# Patient Record
Sex: Male | Born: 1940 | Race: Black or African American | Hispanic: No | Marital: Married | Smoking: Never smoker
Health system: Southern US, Community
[De-identification: ages and names within clinical notes are randomized; demographics above are authoritative.]

## PROBLEM LIST (undated history)

## (undated) DIAGNOSIS — I472 Ventricular tachycardia, unspecified: Secondary | ICD-10-CM

## (undated) DIAGNOSIS — I2510 Atherosclerotic heart disease of native coronary artery without angina pectoris: Secondary | ICD-10-CM

## (undated) DIAGNOSIS — D649 Anemia, unspecified: Secondary | ICD-10-CM

## (undated) DIAGNOSIS — J9610 Chronic respiratory failure, unspecified whether with hypoxia or hypercapnia: Secondary | ICD-10-CM

## (undated) DIAGNOSIS — I639 Cerebral infarction, unspecified: Secondary | ICD-10-CM

## (undated) DIAGNOSIS — I480 Paroxysmal atrial fibrillation: Secondary | ICD-10-CM

## (undated) DIAGNOSIS — I4729 Other ventricular tachycardia: Secondary | ICD-10-CM

## (undated) DIAGNOSIS — I5032 Chronic diastolic (congestive) heart failure: Secondary | ICD-10-CM

## (undated) DIAGNOSIS — D491 Neoplasm of unspecified behavior of respiratory system: Secondary | ICD-10-CM

## (undated) DIAGNOSIS — Q549 Hypospadias, unspecified: Secondary | ICD-10-CM

## (undated) DIAGNOSIS — IMO0002 Reserved for concepts with insufficient information to code with codable children: Secondary | ICD-10-CM

## (undated) DIAGNOSIS — G459 Transient cerebral ischemic attack, unspecified: Secondary | ICD-10-CM

## (undated) DIAGNOSIS — I10 Essential (primary) hypertension: Secondary | ICD-10-CM

## (undated) DIAGNOSIS — J189 Pneumonia, unspecified organism: Secondary | ICD-10-CM

## (undated) DIAGNOSIS — I351 Nonrheumatic aortic (valve) insufficiency: Secondary | ICD-10-CM

## (undated) DIAGNOSIS — N179 Acute kidney failure, unspecified: Secondary | ICD-10-CM

---

## 2004-04-22 ENCOUNTER — Emergency Department: Payer: Self-pay | Admitting: Unknown Physician Specialty

## 2004-11-23 ENCOUNTER — Emergency Department: Payer: Self-pay | Admitting: Unknown Physician Specialty

## 2005-12-29 ENCOUNTER — Emergency Department: Payer: Self-pay | Admitting: Emergency Medicine

## 2006-02-18 ENCOUNTER — Other Ambulatory Visit: Payer: Self-pay

## 2006-02-18 ENCOUNTER — Emergency Department: Payer: Self-pay | Admitting: Unknown Physician Specialty

## 2006-02-20 ENCOUNTER — Emergency Department: Payer: Self-pay | Admitting: General Practice

## 2006-06-07 ENCOUNTER — Ambulatory Visit: Payer: Self-pay | Admitting: Urology

## 2006-07-04 ENCOUNTER — Ambulatory Visit: Payer: Self-pay | Admitting: Urology

## 2007-01-01 ENCOUNTER — Ambulatory Visit: Payer: Self-pay | Admitting: Family Medicine

## 2009-05-17 ENCOUNTER — Emergency Department: Payer: Self-pay | Admitting: Unknown Physician Specialty

## 2011-10-29 LAB — CBC WITH DIFFERENTIAL/PLATELET
Basophil #: 0 10*3/uL (ref 0.0–0.1)
Lymphocyte %: 7.8 %
MCH: 31.3 pg (ref 26.0–34.0)
MCHC: 32.1 g/dL (ref 32.0–36.0)
MCV: 98 fL (ref 80–100)
Monocyte %: 5.5 %
Neutrophil #: 13 10*3/uL — ABNORMAL HIGH (ref 1.4–6.5)
Platelet: 102 10*3/uL — ABNORMAL LOW (ref 150–440)
RDW: 14.7 % — ABNORMAL HIGH (ref 11.5–14.5)
WBC: 15.1 10*3/uL — ABNORMAL HIGH (ref 3.8–10.6)

## 2011-10-29 LAB — COMPREHENSIVE METABOLIC PANEL
Albumin: 3.2 g/dL — ABNORMAL LOW (ref 3.4–5.0)
Alkaline Phosphatase: 83 U/L (ref 50–136)
Anion Gap: 8 (ref 7–16)
BUN: 15 mg/dL (ref 7–18)
Bilirubin,Total: 0.4 mg/dL (ref 0.2–1.0)
Calcium, Total: 8.7 mg/dL (ref 8.5–10.1)
Co2: 26 mmol/L (ref 21–32)
EGFR (African American): >60
Glucose: 130 mg/dL — ABNORMAL HIGH (ref 65–99)
Osmolality: 278 (ref 275–301)
SGPT (ALT): 22 U/L
Sodium: 138 mmol/L (ref 136–145)
Total Protein: 7.5 g/dL (ref 6.4–8.2)

## 2011-10-29 LAB — URINALYSIS, COMPLETE
Bilirubin,UR: NEGATIVE
Glucose,UR: NEGATIVE mg/dL (ref 0–75)
Ketone: NEGATIVE
Ph: 5 (ref 4.5–8.0)
Protein: NEGATIVE
Squamous Epithelial: 2
WBC UR: 107 /HPF (ref 0–5)

## 2011-10-30 ENCOUNTER — Inpatient Hospital Stay: Payer: Self-pay | Admitting: Internal Medicine

## 2011-10-30 LAB — TROPONIN I
Troponin-I: 0.19 ng/mL — ABNORMAL HIGH
Troponin-I: 0.19 ng/mL — ABNORMAL HIGH

## 2011-10-30 LAB — CK TOTAL AND CKMB (NOT AT ARMC)
CK, Total: 41 U/L (ref 35–232)
CK, Total: 32 U/L — ABNORMAL LOW (ref 35–232)
CK-MB: <0.5 ng/mL — ABNORMAL LOW (ref 0.5–3.6)

## 2011-10-31 LAB — CBC WITH DIFFERENTIAL/PLATELET
Basophil #: 0 10*3/uL (ref 0.0–0.1)
Basophil %: 0.3 %
Eosinophil #: 0.1 10*3/uL (ref 0.0–0.7)
Eosinophil %: 0.9 %
HGB: 12.7 g/dL — ABNORMAL LOW (ref 13.0–18.0)
Lymphocyte #: 1.3 10*3/uL (ref 1.0–3.6)
MCH: 31.4 pg (ref 26.0–34.0)
Monocyte #: 0.8 x10 3/mm (ref 0.2–1.0)
Monocyte %: 6.3 %
Neutrophil #: 11 10*3/uL — ABNORMAL HIGH (ref 1.4–6.5)
Neutrophil %: 83 %
Platelet: 87 10*3/uL — ABNORMAL LOW (ref 150–440)
RBC: 4.03 10*6/uL — ABNORMAL LOW (ref 4.40–5.90)
RDW: 14.6 % — ABNORMAL HIGH (ref 11.5–14.5)

## 2011-10-31 LAB — LIPID PANEL
Cholesterol: 85 mg/dL (ref 0–200)
Ldl Cholesterol, Calc: 31 mg/dL (ref 0–100)
Triglycerides: 59 mg/dL (ref 0–200)
VLDL Cholesterol, Calc: 12 mg/dL (ref 5–40)

## 2011-10-31 LAB — MAGNESIUM: Magnesium: 1.7 mg/dL — ABNORMAL LOW

## 2011-10-31 LAB — BASIC METABOLIC PANEL
Anion Gap: 11 (ref 7–16)
BUN: 13 mg/dL (ref 7–18)
Chloride: 106 mmol/L (ref 98–107)
Co2: 26 mmol/L (ref 21–32)
Creatinine: 0.88 mg/dL (ref 0.60–1.30)
EGFR (African American): >60
EGFR (Non-African Amer.): >60
Glucose: 80 mg/dL (ref 65–99)
Potassium: 3.3 mmol/L — ABNORMAL LOW (ref 3.5–5.1)

## 2011-10-31 LAB — PROTIME-INR
INR: 1.1
Prothrombin Time: 14.1 secs (ref 11.5–14.7)

## 2011-10-31 LAB — PHOSPHORUS: Phosphorus: 2.8 mg/dL (ref 2.5–4.9)

## 2011-10-31 LAB — URINE CULTURE

## 2011-11-01 LAB — CBC WITH DIFFERENTIAL/PLATELET
Basophil #: 0 10*3/uL (ref 0.0–0.1)
Eosinophil #: 0.4 10*3/uL (ref 0.0–0.7)
HCT: 38.1 % — ABNORMAL LOW (ref 40.0–52.0)
HGB: 12.1 g/dL — ABNORMAL LOW (ref 13.0–18.0)
Lymphocyte #: 1.5 10*3/uL (ref 1.0–3.6)
MCH: 31.1 pg (ref 26.0–34.0)
MCV: 98 fL (ref 80–100)
Monocyte %: 7 %
Neutrophil %: 69.3 %
Platelet: 88 10*3/uL — ABNORMAL LOW (ref 150–440)
RBC: 3.87 10*6/uL — ABNORMAL LOW (ref 4.40–5.90)
RDW: 14.6 % — ABNORMAL HIGH (ref 11.5–14.5)

## 2011-11-01 LAB — URINE CULTURE

## 2011-11-04 LAB — CULTURE, BLOOD (SINGLE)

## 2013-04-09 ENCOUNTER — Ambulatory Visit (HOSPITAL_COMMUNITY): Admit: 2013-04-09 | Payer: Self-pay | Admitting: Cardiology

## 2013-04-09 ENCOUNTER — Inpatient Hospital Stay (HOSPITAL_COMMUNITY)
Admission: EM | Admit: 2013-04-09 | Discharge: 2013-04-30 | DRG: 004 | Disposition: A | Discharge status: Home Health | Payer: Medicare Other | Attending: Cardiology | Admitting: Cardiology

## 2013-04-09 ENCOUNTER — Encounter (HOSPITAL_COMMUNITY): Payer: Self-pay | Admitting: Physician Assistant

## 2013-04-09 ENCOUNTER — Encounter (HOSPITAL_COMMUNITY)
Admission: EM | Disposition: A | Discharge status: Home Health | Payer: Medicare Other | Source: Home / Self Care | Attending: Cardiology

## 2013-04-09 ENCOUNTER — Emergency Department (HOSPITAL_COMMUNITY): Payer: Medicare Other

## 2013-04-09 DIAGNOSIS — I10 Essential (primary) hypertension: Secondary | ICD-10-CM | POA: Diagnosis present

## 2013-04-09 DIAGNOSIS — Z930 Tracheostomy status: Secondary | ICD-10-CM

## 2013-04-09 DIAGNOSIS — I69959 Hemiplegia and hemiparesis following unspecified cerebrovascular disease affecting unspecified side: Secondary | ICD-10-CM

## 2013-04-09 DIAGNOSIS — I959 Hypotension, unspecified: Secondary | ICD-10-CM | POA: Diagnosis present

## 2013-04-09 DIAGNOSIS — I4891 Unspecified atrial fibrillation: Secondary | ICD-10-CM

## 2013-04-09 DIAGNOSIS — J9620 Acute and chronic respiratory failure, unspecified whether with hypoxia or hypercapnia: Secondary | ICD-10-CM | POA: Diagnosis present

## 2013-04-09 DIAGNOSIS — J9600 Acute respiratory failure, unspecified whether with hypoxia or hypercapnia: Secondary | ICD-10-CM | POA: Diagnosis present

## 2013-04-09 DIAGNOSIS — I5043 Acute on chronic combined systolic (congestive) and diastolic (congestive) heart failure: Secondary | ICD-10-CM | POA: Diagnosis present

## 2013-04-09 DIAGNOSIS — I351 Nonrheumatic aortic (valve) insufficiency: Secondary | ICD-10-CM | POA: Diagnosis present

## 2013-04-09 DIAGNOSIS — T50995A Adverse effect of other drugs, medicaments and biological substances, initial encounter: Secondary | ICD-10-CM | POA: Diagnosis not present

## 2013-04-09 DIAGNOSIS — G92 Toxic encephalopathy: Secondary | ICD-10-CM | POA: Diagnosis not present

## 2013-04-09 DIAGNOSIS — C3410 Malignant neoplasm of upper lobe, unspecified bronchus or lung: Secondary | ICD-10-CM | POA: Diagnosis present

## 2013-04-09 DIAGNOSIS — I5033 Acute on chronic diastolic (congestive) heart failure: Secondary | ICD-10-CM | POA: Diagnosis present

## 2013-04-09 DIAGNOSIS — T17308A Unspecified foreign body in larynx causing other injury, initial encounter: Secondary | ICD-10-CM | POA: Diagnosis not present

## 2013-04-09 DIAGNOSIS — R918 Other nonspecific abnormal finding of lung field: Secondary | ICD-10-CM

## 2013-04-09 DIAGNOSIS — E875 Hyperkalemia: Secondary | ICD-10-CM | POA: Diagnosis not present

## 2013-04-09 DIAGNOSIS — I472 Ventricular tachycardia, unspecified: Secondary | ICD-10-CM | POA: Diagnosis present

## 2013-04-09 DIAGNOSIS — I4729 Other ventricular tachycardia: Secondary | ICD-10-CM | POA: Diagnosis not present

## 2013-04-09 DIAGNOSIS — R1319 Other dysphagia: Secondary | ICD-10-CM | POA: Diagnosis present

## 2013-04-09 DIAGNOSIS — G929 Unspecified toxic encephalopathy: Secondary | ICD-10-CM | POA: Diagnosis not present

## 2013-04-09 DIAGNOSIS — Z823 Family history of stroke: Secondary | ICD-10-CM

## 2013-04-09 DIAGNOSIS — I2510 Atherosclerotic heart disease of native coronary artery without angina pectoris: Secondary | ICD-10-CM | POA: Diagnosis present

## 2013-04-09 DIAGNOSIS — J9690 Respiratory failure, unspecified, unspecified whether with hypoxia or hypercapnia: Secondary | ICD-10-CM

## 2013-04-09 DIAGNOSIS — Y9210 Unspecified residential institution as the place of occurrence of the external cause: Secondary | ICD-10-CM | POA: Diagnosis not present

## 2013-04-09 DIAGNOSIS — R7309 Other abnormal glucose: Secondary | ICD-10-CM | POA: Diagnosis not present

## 2013-04-09 DIAGNOSIS — I2119 ST elevation (STEMI) myocardial infarction involving other coronary artery of inferior wall: Secondary | ICD-10-CM | POA: Diagnosis present

## 2013-04-09 DIAGNOSIS — Z931 Gastrostomy status: Secondary | ICD-10-CM

## 2013-04-09 DIAGNOSIS — M436 Torticollis: Secondary | ICD-10-CM | POA: Diagnosis present

## 2013-04-09 DIAGNOSIS — I319 Disease of pericardium, unspecified: Secondary | ICD-10-CM | POA: Diagnosis present

## 2013-04-09 DIAGNOSIS — N179 Acute kidney failure, unspecified: Secondary | ICD-10-CM | POA: Diagnosis present

## 2013-04-09 DIAGNOSIS — I69991 Dysphagia following unspecified cerebrovascular disease: Secondary | ICD-10-CM

## 2013-04-09 DIAGNOSIS — I428 Other cardiomyopathies: Secondary | ICD-10-CM | POA: Diagnosis present

## 2013-04-09 DIAGNOSIS — Z993 Dependence on wheelchair: Secondary | ICD-10-CM

## 2013-04-09 DIAGNOSIS — D649 Anemia, unspecified: Secondary | ICD-10-CM | POA: Diagnosis present

## 2013-04-09 DIAGNOSIS — J9501 Hemorrhage from tracheostomy stoma: Secondary | ICD-10-CM

## 2013-04-09 DIAGNOSIS — Z8673 Personal history of transient ischemic attack (TIA), and cerebral infarction without residual deficits: Secondary | ICD-10-CM

## 2013-04-09 DIAGNOSIS — R5381 Other malaise: Secondary | ICD-10-CM | POA: Diagnosis present

## 2013-04-09 DIAGNOSIS — E870 Hyperosmolality and hypernatremia: Secondary | ICD-10-CM | POA: Diagnosis present

## 2013-04-09 DIAGNOSIS — Q549 Hypospadias, unspecified: Secondary | ICD-10-CM

## 2013-04-09 DIAGNOSIS — Z7982 Long term (current) use of aspirin: Secondary | ICD-10-CM

## 2013-04-09 DIAGNOSIS — J690 Pneumonitis due to inhalation of food and vomit: Secondary | ICD-10-CM | POA: Diagnosis present

## 2013-04-09 DIAGNOSIS — Z79899 Other long term (current) drug therapy: Secondary | ICD-10-CM

## 2013-04-09 DIAGNOSIS — IMO0002 Reserved for concepts with insufficient information to code with codable children: Secondary | ICD-10-CM

## 2013-04-09 DIAGNOSIS — I69920 Aphasia following unspecified cerebrovascular disease: Secondary | ICD-10-CM

## 2013-04-09 DIAGNOSIS — I509 Heart failure, unspecified: Secondary | ICD-10-CM | POA: Diagnosis present

## 2013-04-09 DIAGNOSIS — E876 Hypokalemia: Secondary | ICD-10-CM | POA: Diagnosis present

## 2013-04-09 DIAGNOSIS — I213 ST elevation (STEMI) myocardial infarction of unspecified site: Secondary | ICD-10-CM

## 2013-04-09 DIAGNOSIS — I480 Paroxysmal atrial fibrillation: Secondary | ICD-10-CM | POA: Diagnosis present

## 2013-04-09 HISTORY — DX: Reserved for concepts with insufficient information to code with codable children: IMO0002

## 2013-04-09 HISTORY — DX: Neoplasm of unspecified behavior of respiratory system: D49.1

## 2013-04-09 HISTORY — DX: Pneumonia, unspecified organism: J18.9

## 2013-04-09 HISTORY — PX: LEFT HEART CATHETERIZATION WITH CORONARY ANGIOGRAM: SHX5451

## 2013-04-09 HISTORY — DX: Acute kidney failure, unspecified: N17.9

## 2013-04-09 HISTORY — DX: Atherosclerotic heart disease of native coronary artery without angina pectoris: I25.10

## 2013-04-09 HISTORY — DX: Hypospadias, unspecified: Q54.9

## 2013-04-09 HISTORY — DX: Transient cerebral ischemic attack, unspecified: G45.9

## 2013-04-09 HISTORY — DX: Ventricular tachycardia, unspecified: I47.20

## 2013-04-09 HISTORY — DX: Paroxysmal atrial fibrillation: I48.0

## 2013-04-09 HISTORY — DX: Other ventricular tachycardia: I47.29

## 2013-04-09 HISTORY — DX: Nonrheumatic aortic (valve) insufficiency: I35.1

## 2013-04-09 HISTORY — DX: Essential (primary) hypertension: I10

## 2013-04-09 HISTORY — DX: Anemia, unspecified: D64.9

## 2013-04-09 HISTORY — DX: Cerebral infarction, unspecified: I63.9

## 2013-04-09 HISTORY — DX: Chronic respiratory failure, unspecified whether with hypoxia or hypercapnia: J96.10

## 2013-04-09 HISTORY — DX: Ventricular tachycardia: I47.2

## 2013-04-09 HISTORY — DX: Chronic diastolic (congestive) heart failure: I50.32

## 2013-04-09 HISTORY — PX: JEJUNOSTOMY FEEDING TUBE: SUR737

## 2013-04-09 LAB — APTT: aPTT: 24 seconds (ref 24–37)

## 2013-04-09 LAB — POCT I-STAT, CHEM 8
BUN: 21 mg/dL (ref 6–23)
Creatinine, Ser: 1.4 mg/dL — ABNORMAL HIGH (ref 0.50–1.35)
Glucose, Bld: 203 mg/dL — ABNORMAL HIGH (ref 70–99)
Hemoglobin: 16.7 g/dL (ref 13.0–17.0)
Potassium: 4 mEq/L (ref 3.5–5.1)
Sodium: 144 mEq/L (ref 135–145)
TCO2: 25 mmol/L (ref 0–100)

## 2013-04-09 LAB — HEMOGLOBIN A1C
Hgb A1c MFr Bld: 5.5 % (ref <5.7)
Mean Plasma Glucose: 111 mg/dL (ref <117)

## 2013-04-09 LAB — URINE MICROSCOPIC-ADD ON

## 2013-04-09 LAB — CBC
HCT: 46.2 % (ref 39.0–52.0)
MCHC: 32.5 g/dL (ref 30.0–36.0)
Platelets: 173 10*3/uL (ref 150–400)
RDW: 15 % (ref 11.5–15.5)
WBC: 10.6 10*3/uL — ABNORMAL HIGH (ref 4.0–10.5)

## 2013-04-09 LAB — URINALYSIS, ROUTINE W REFLEX MICROSCOPIC
Glucose, UA: NEGATIVE mg/dL
Leukocytes, UA: NEGATIVE
pH: 5 (ref 5.0–8.0)

## 2013-04-09 LAB — TROPONIN I
Troponin I: <0.3 ng/mL (ref <0.30)
Troponin I: 0.63 ng/mL (ref <0.30)

## 2013-04-09 LAB — COMPREHENSIVE METABOLIC PANEL
AST: 23 U/L (ref 0–37)
Albumin: 3.1 g/dL — ABNORMAL LOW (ref 3.5–5.2)
CO2: 23 mEq/L (ref 19–32)
Calcium: 8.8 mg/dL (ref 8.4–10.5)
Creatinine, Ser: 1.17 mg/dL (ref 0.50–1.35)
GFR calc non Af Amer: 61 mL/min — ABNORMAL LOW (ref >90)
Glucose, Bld: 207 mg/dL — ABNORMAL HIGH (ref 70–99)

## 2013-04-09 LAB — PROTIME-INR
INR: 1.27 (ref 0.00–1.49)
Prothrombin Time: 15.6 seconds — ABNORMAL HIGH (ref 11.6–15.2)

## 2013-04-09 LAB — POCT I-STAT 3, ART BLOOD GAS (G3+)
Acid-base deficit: 4 mmol/L — ABNORMAL HIGH (ref 0.0–2.0)
Acid-base deficit: 5 mmol/L — ABNORMAL HIGH (ref 0.0–2.0)
Bicarbonate: 22.5 mEq/L (ref 20.0–24.0)
O2 Saturation: 97 %
O2 Saturation: 92 %
Patient temperature: 98.9
Patient temperature: 98.7
TCO2: 24 mmol/L (ref 0–100)
pH, Arterial: 7.325 — ABNORMAL LOW (ref 7.350–7.450)
pO2, Arterial: 101 mmHg — ABNORMAL HIGH (ref 80.0–100.0)

## 2013-04-09 LAB — LIPID PANEL
Cholesterol: 131 mg/dL (ref 0–200)
HDL: 46 mg/dL (ref >39)
Total CHOL/HDL Ratio: 2.8 RATIO

## 2013-04-09 LAB — HEPARIN LEVEL (UNFRACTIONATED): Heparin Unfractionated: 0.52 IU/mL (ref 0.30–0.70)

## 2013-04-09 LAB — CK TOTAL AND CKMB (NOT AT ARMC)
CK, MB: 2.4 ng/mL (ref 0.3–4.0)
Relative Index: INVALID (ref 0.0–2.5)

## 2013-04-09 LAB — INFLUENZA PANEL BY PCR (TYPE A & B): Influenza A By PCR: NEGATIVE

## 2013-04-09 LAB — MAGNESIUM: Magnesium: 1.6 mg/dL (ref 1.5–2.5)

## 2013-04-09 SURGERY — LEFT HEART CATHETERIZATION WITH CORONARY ANGIOGRAM
Anesthesia: LOCAL

## 2013-04-09 MED ORDER — AMIODARONE HCL IN DEXTROSE 360-4.14 MG/200ML-% IV SOLN
60.0000 mg/h | INTRAVENOUS | Status: AC
  Administered 2013-04-09 (×2): 60 mg/h via INTRAVENOUS
  Filled 2013-04-09: qty 200

## 2013-04-09 MED ORDER — ASPIRIN 81 MG PO CHEW
81.0000 mg | CHEWABLE_TABLET | Freq: Every day | ORAL | Status: DC
  Administered 2013-04-10 – 2013-04-30 (×21): 81 mg via JEJUNOSTOMY
  Filled 2013-04-09 (×21): qty 1

## 2013-04-09 MED ORDER — METOPROLOL TARTRATE 25 MG/10 ML ORAL SUSPENSION
25.0000 mg | Freq: Four times a day (QID) | ORAL | Status: DC
  Administered 2013-04-09 – 2013-04-16 (×24): 25 mg
  Filled 2013-04-09 (×33): qty 10

## 2013-04-09 MED ORDER — HEPARIN SODIUM (PORCINE) 5000 UNIT/ML IJ SOLN: 4000.0000 [IU] | INTRAMUSCULAR | Status: AC

## 2013-04-09 MED ORDER — ETOMIDATE 2 MG/ML IV SOLN
INTRAVENOUS | Status: AC | PRN
  Administered 2013-04-09: 30 mg via INTRAVENOUS

## 2013-04-09 MED ORDER — PIPERACILLIN-TAZOBACTAM 3.375 G IVPB 30 MIN: 3.3750 g | Freq: Once | INTRAVENOUS | Status: DC

## 2013-04-09 MED ORDER — BIOTENE DRY MOUTH MT LIQD
15.0000 mL | Freq: Four times a day (QID) | OROMUCOSAL | Status: DC
  Administered 2013-04-09 – 2013-04-30 (×80): 15 mL via OROMUCOSAL

## 2013-04-09 MED ORDER — ATORVASTATIN CALCIUM 40 MG PO TABS
40.0000 mg | ORAL_TABLET | Freq: Every day | ORAL | Status: DC
  Administered 2013-04-09 – 2013-04-14 (×6): 40 mg
  Filled 2013-04-09 (×7): qty 1

## 2013-04-09 MED ORDER — NITROGLYCERIN IN D5W 200-5 MCG/ML-% IV SOLN
3.0000 ug/min | INTRAVENOUS | Status: DC
  Administered 2013-04-09: 25 ug/min via INTRAVENOUS

## 2013-04-09 MED ORDER — DEXTROSE 5 % IV SOLN
500.0000 mg | INTRAVENOUS | Status: DC
  Administered 2013-04-09 – 2013-04-15 (×7): 500 mg via INTRAVENOUS
  Filled 2013-04-09 (×8): qty 500

## 2013-04-09 MED ORDER — ONDANSETRON HCL 4 MG/2ML IJ SOLN: 4.0000 mg | Freq: Four times a day (QID) | INTRAMUSCULAR | Status: DC | PRN

## 2013-04-09 MED ORDER — DEXTROSE 5 % IV SOLN
2.0000 g | INTRAVENOUS | Status: DC
  Administered 2013-04-09 – 2013-04-18 (×10): 2 g via INTRAVENOUS
  Filled 2013-04-09 (×10): qty 2

## 2013-04-09 MED ORDER — DEXTROSE 5 % IV SOLN: 1.0000 g | Freq: Two times a day (BID) | INTRAVENOUS | Status: DC

## 2013-04-09 MED ORDER — MUPIROCIN 2 % EX OINT
1.0000 | TOPICAL_OINTMENT | Freq: Two times a day (BID) | CUTANEOUS | Status: AC
Start: 2013-04-09 — End: 2013-04-13
  Administered 2013-04-09 – 2013-04-13 (×10): 1 via NASAL
  Filled 2013-04-09 (×2): qty 22

## 2013-04-09 MED ORDER — CHLORHEXIDINE GLUCONATE 0.12 % MT SOLN
15.0000 mL | Freq: Two times a day (BID) | OROMUCOSAL | Status: DC
  Administered 2013-04-09 – 2013-04-30 (×42): 15 mL via OROMUCOSAL
  Filled 2013-04-09 (×44): qty 15

## 2013-04-09 MED ORDER — CLOPIDOGREL BISULFATE 300 MG PO TABS
300.0000 mg | ORAL_TABLET | Freq: Once | ORAL | Status: AC
  Administered 2013-04-09: 300 mg
  Filled 2013-04-09: qty 1

## 2013-04-09 MED ORDER — NITROGLYCERIN 0.4 MG SL SUBL: 0.4000 mg | SUBLINGUAL_TABLET | SUBLINGUAL | Status: DC | PRN

## 2013-04-09 MED ORDER — CLOPIDOGREL BISULFATE 75 MG PO TABS
75.0000 mg | ORAL_TABLET | Freq: Every day | ORAL | Status: DC
  Administered 2013-04-10 – 2013-04-14 (×5): 75 mg
  Filled 2013-04-09 (×8): qty 1

## 2013-04-09 MED ORDER — METOPROLOL TARTRATE 25 MG PO TABS: 25.0000 mg | ORAL_TABLET | Freq: Four times a day (QID) | ORAL | Status: DC

## 2013-04-09 MED ORDER — CHLORHEXIDINE GLUCONATE CLOTH 2 % EX PADS
6.0000 | MEDICATED_PAD | Freq: Every day | CUTANEOUS | Status: AC
  Administered 2013-04-10 – 2013-04-14 (×5): 6 via TOPICAL

## 2013-04-09 MED ORDER — ASPIRIN 300 MG RE SUPP: 300.0000 mg | Freq: Once | RECTAL | Status: DC

## 2013-04-09 MED ORDER — FENTANYL CITRATE 0.05 MG/ML IJ SOLN
100.0000 ug | INTRAMUSCULAR | Status: DC | PRN
  Administered 2013-04-09: 100 ug via INTRAVENOUS
  Filled 2013-04-09: qty 2

## 2013-04-09 MED ORDER — ASPIRIN 81 MG PO CHEW
324.0000 mg | CHEWABLE_TABLET | Freq: Once | ORAL | Status: AC
  Administered 2013-04-09: 324 mg
  Filled 2013-04-09: qty 4

## 2013-04-09 MED ORDER — ROCURONIUM BROMIDE 50 MG/5ML IV SOLN
INTRAVENOUS | Status: AC | PRN
  Administered 2013-04-09: 80 mg via INTRAVENOUS

## 2013-04-09 MED ORDER — SODIUM CHLORIDE 0.9 % IV SOLN
10.0000 ug/h | INTRAVENOUS | Status: DC
  Administered 2013-04-09: 10 ug/h via INTRAVENOUS
  Filled 2013-04-09: qty 50

## 2013-04-09 MED ORDER — SODIUM CHLORIDE 0.9 % IV SOLN
1.0000 mL/kg/h | INTRAVENOUS | Status: AC
  Administered 2013-04-09: 1 mL/kg/h via INTRAVENOUS

## 2013-04-09 MED ORDER — HEPARIN (PORCINE) IN NACL 100-0.45 UNIT/ML-% IJ SOLN
1050.0000 [IU]/h | INTRAMUSCULAR | Status: DC
  Administered 2013-04-09: 950 [IU]/h via INTRAVENOUS
  Administered 2013-04-10: 1050 [IU]/h via INTRAVENOUS
  Filled 2013-04-09 (×3): qty 250

## 2013-04-09 MED ORDER — OXYBUTYNIN CHLORIDE 5 MG PO TABS
5.0000 mg | ORAL_TABLET | Freq: Every day | ORAL | Status: DC
  Administered 2013-04-10 – 2013-04-30 (×21): 5 mg
  Filled 2013-04-09 (×22): qty 1

## 2013-04-09 MED ORDER — SODIUM CHLORIDE 0.9 % IV BOLUS (SEPSIS)
500.0000 mL | Freq: Once | INTRAVENOUS | Status: AC
  Administered 2013-04-09: 500 mL via INTRAVENOUS

## 2013-04-09 MED ORDER — FENTANYL CITRATE 0.05 MG/ML IJ SOLN: 100.0000 ug | INTRAMUSCULAR | Status: DC | PRN

## 2013-04-09 MED ORDER — AMIODARONE HCL IN DEXTROSE 360-4.14 MG/200ML-% IV SOLN
30.0000 mg/h | INTRAVENOUS | Status: DC
  Administered 2013-04-09 – 2013-04-12 (×6): 30 mg/h via INTRAVENOUS
  Filled 2013-04-09 (×12): qty 200

## 2013-04-09 MED ORDER — HEPARIN BOLUS VIA INFUSION: 4000.0000 [IU] | Freq: Once | INTRAVENOUS | Status: DC

## 2013-04-09 MED ORDER — PANTOPRAZOLE SODIUM 40 MG IV SOLR
40.0000 mg | Freq: Every day | INTRAVENOUS | Status: DC
  Administered 2013-04-09 – 2013-04-12 (×4): 40 mg via INTRAVENOUS
  Filled 2013-04-09 (×5): qty 40

## 2013-04-09 NOTE — Progress Notes (Signed)
INITIAL NUTRITION ASSESSMENT  DOCUMENTATION CODES Per approved criteria  -Not Applicable   INTERVENTION: 1.  Enteral nutrition; initiate Jevity 1.2 @ 20 mL/hr continuous.  Advance by 10 mL q 4 hrs to 55 mL/hr continuous with Prostat once daily to provide 1684 kcal, 88g protein, and 1082 mL free water.   NUTRITION DIAGNOSIS: Inadequate oral intake related to inability to eat as evidenced by vent, NPO.   Monitor:  1.  Enteral nutrition; initiation with tolerance.  Pt to meet >/=90% estimated needs with nutrition support.  2.  Wt/wt change; monitor trends  Reason for Assessment: vent, home TF  72 y.o. male  Admitting Dx: NSTEMI  ASSESSMENT: Pt admitted with NSTEMI. Patient is currently intubated on ventilator support.  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 He has some ability to communication per wife, however is not verbal.  Home regimen is: 1 can Osmolite 1.2 + 1 can Jevity 1.2 in the mornings for a total of 2 cans daily.  In addition, pt consumes meals and snacks throughout the day.  Wife prepares foods soft and bland, and mashes them into a soft bites.  Pt can chew and swallow well at baseline.   Height: Ht Readings from Last 1 Encounters:  04/09/13 5\' 6"  (1.676 m)    Weight: Wt Readings from Last 1 Encounters:  04/09/13 158 lb 1.1 oz (71.7 kg)    Ideal Body Weight: 70 kg  % Ideal Body Weight: 102%  Wt Readings from Last 10 Encounters:  04/09/13 158 lb 1.1 oz (71.7 kg)  04/09/13 158 lb 1.1 oz (71.7 kg)    Usual Body Weight: unknown per family they deny wt loss  BMI:  Body mass index is 25.53 kg/(m^2).  Estimated Nutritional Needs: Kcal: 1786 Protein: 84-98g Fluid: >1.8 L/day  Skin: intact  Diet Order: NPO  EDUCATION NEEDS: -No education needs identified at this time  No intake or output data in the 24  hours ending 04/09/13 1015  Last BM: PTA  Labs:   Recent Labs Lab 04/09/13 0755  NA 142  K 4.0  CL 103  CO2 23  BUN 19  CREATININE 1.17  CALCIUM 8.8  GLUCOSE 207*    CBG (last 3)  No results found for this basename: GLUCAP,  in the last 72 hours  Scheduled Meds: . aspirin  324 mg Per Tube Once  . [START ON 04/10/2013] aspirin  81 mg Per J Tube Daily  . atorvastatin  40 mg Per J Tube q1800  . azithromycin  500 mg Intravenous Q24H  . cefTRIAXone (ROCEPHIN)  IV  1 g Intravenous Q12H  . clopidogrel  300 mg Per J Tube Once  . [START ON 04/10/2013] clopidogrel  75 mg Per J Tube Daily  . heparin  4,000 Units Intravenous STAT  . metoprolol tartrate  25 mg Per J Tube Q6H  . oxybutynin  5 mg Per J Tube Daily  . pantoprazole (PROTONIX) IV  40 mg Intravenous Daily    Continuous Infusions: . sodium chloride 1 mL/kg/hr (04/09/13 0900)  . amiodarone (NEXTERONE PREMIX) 360 mg/200 mL dextrose 60 mg/hr (04/09/13 0900)  . amiodarone (NEXTERONE PREMIX) 360 mg/200 mL dextrose    . heparin 950 Units/hr (04/09/13 0900)  . nitroGLYCERIN 25 mcg/min (04/09/13 0900)    Past Medical History  Diagnosis Date  . CVA (cerebral  infarction)     a. 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 b. Residual aphasia and torticollis. Does not ambulate - uses a motorized chair.  Marland Kitchen TIA (transient ischemic attack)     a. 2013 around time of pneumonia - x3  . Pneumonia   . History of jejunostomy tube placement     a. Per wife due to strokes.  . Irregular heartbeat     a. Per wife. No hx of blood thinners or complications from blood thinners.  . Enlarged heart     a. Per wife.  Marland Kitchen HTN (hypertension)   . Pulmonary mass     a. Wife states has been followed with serial cxrs.    No past surgical history on file.  Loyce Dys, MS RD LDN Clinical Inpatient Dietitian Pager: [phone] Weekend/After hours pager: [phone]

## 2013-04-09 NOTE — Progress Notes (Signed)
Chaplain responded to code stemi.  Pt was taken to trauma C.  Just outside of trauma C I met pt's wife.  She was noticeably distressed.  I escorted  pt's wife to cath lab waiting area and offered emotional support and prayer.  I was present as Dr. Swaz[place] explained the results of the cath.  I then accompanied the pt's wife to [facility] waiting area and continued to offer support.   04/09/13 0800  Clinical Encounter Type  Visited With Family;Patient not available  Visit Type Spiritual support;Critical Care  Spiritual Encounters  Spiritual Needs Emotional;Prayer    Rulon Abide

## 2013-04-09 NOTE — CV Procedure (Signed)
   Cardiac Catheterization Procedure Note  Name: [name] [name] MRN: [id number] DOB: 1941/02/20  Procedure: Left Heart Cath, Selective Coronary Angiography, LV angiography  Indication: 72 year old black male with history of multiple CVAs and chronic aphasia. Patient presents with acute respiratory failure associated with atrial fibrillation a rapid ventricular response. ECG showed diffuse ST elevation more pronounced in the inferior lateral leads. In the emergency department the patient decompensated and required intubation. He developed sustained ventricular tachycardia requiring cardioversion. ECG showed persistent ST elevation as noted. Family wanted aggressive management. There was delay in bringing the patient to the cath lab because of his hemodynamic instability and needs for management of his respiratory failure in the emergency department.   Procedural details: The right groin was prepped, draped, and anesthetized with 1% lidocaine. Using modified Seldinger technique, a  6 [place] sheath was introduced into the right femoral artery and a separate 6 French sheath was placed in the right femoral vein for IV access. Standard Judkins catheters were used for coronary angiography. Catheter exchanges were performed over a guidewire. There were no immediate procedural complications. The patient was transferred to the post catheterization recovery area for further monitoring.  Procedural Findings: Hemodynamics:  AO 190/104 with a mean of 133 mmHg LV 189/27 mmHg   Coronary angiography: Coronary dominance: Left  Left mainstem: Normal.  Left anterior descending (LAD): Minor irregularities less than 10%.  There is a large ramus intermediate branch which is normal.  Left circumflex (LCx): This is a large dominant vessel. There is diffuse 20% narrowing in the proximal vessel. The second marginal branch has a 40% stenosis proximally.  Right coronary artery (RCA): The right coronary is a  nondominant vessel. It gives rise to a single right ventricular marginal branch. In the mid vessel there is a 95% stenosis that is focal. There is TIMI grade 3 flow.  Left ventriculography: Not performed  Final Conclusions:   1. Single vessel obstructive coronary disease. Patient does have a critical stenosis in a nondominant right coronary. The large dominant left coronary system is without significant disease.  Recommendations: I recommend medical management of his coronary disease. I do not think that the right coronary lesion is the cause of his acute decompensation. Chest x-ray indicates a significant right lung pneumonia. It is possible that his ST changes may be related to pericarditis in association with his pneumonia. We will manage him with IV heparin and nitroglycerin. We'll continue aspirin and beta blocker therapy. We will add Plavix. Critical care medicine was consult for management of his pneumonia and respiratory failure. Patient is critically ill and prognosis is poor. We will obtain an echocardiogram to assess his LV function.  Theron Arista [facility] 04/09/2013, 8:47 AM

## 2013-04-09 NOTE — H&P (Signed)
History and Physical  Patient ID: [name] [name] MRN: [id number], DOB: 1941/02/12 Date of Encounter: 04/09/2013, 8:27 AM Primary Physician: No primary provider on file. Primary Cardiologist: New, being seen by Dr. Swaziland.  Chief Complaint: SOB Reason for Admission: STEMI, AF-RVR, VT, acute resp failure.  HPI: [name] [name] is a 72 y/o M with history of multiple CVA/TIAs (residual aphasia, chronic torticollis-like neck alignment and J-tube placement), irregular heartbeat, enlarged heart, "small pulmonary mass" and HTN who presented to [facility] today with STEMI, rapid AF, acute respiratory failure, and VT. His wife provides all of his history. She denies prior h/o CAD but endorses history of irregular heartbeat and enlarged heart "due to his strokes." She states [name] [name] has never been on blood thinners besides ASA and has not had any bleeding issues. [name] [name] is confined to a motorized wheelchair at home. She says [name] [name] has baseline aphasia at home but is still able to communicate. She reports that last night [name] [name] developed worsening SOB. [name] [name] has dyspnea at times but this was worse. [name] [name] denied any CP or other acute symptoms. She gave him his aspirin and atenolol and [name] [name] was able to rest. This morning however [name] [name] complained of increased SOB. She gave him juice and [name] [name] began coughing. She says [name] [name] takes Jevity/meds through his g-tube but states that [name] [name] also does eat at home. She called EMS who found the patient to be in respiratory distress and hypotensive at 71/54. [name] [name] was in rapid atrial fibrillation with ST elevation in II, avF, V2-V6. Code STEMI was called. [name] [name] also began to have NSVT thus was placed on amio gtt without bolus by EMS. In the ED, breathing became agonal so [name] [name] was intubated. [name] [name] developed a run of sustained VT and received shock x 1. [name] [name] remains on amiodarone gtt. [name] [name] converted to NSR in the ED with PACs/PVCs. Now that [name] [name] is back in NSR, BP is markedly elevated. CXR shows multilobar R lung airspace disease  with involvement also suspected at left lung base, consider large volume aspiration vs multilobar PNA. Rectal ASA not given in ER thus was ordered ASA 324mg  x 1 in cath lab via J-tube.  His wife denies any recent CP, fever, chills, nausea, vomiting, bleeding issues. She was alarmed to hear [name] [name] was having a heart attack since [name] [name]'s never had CAD before.  Past Medical History  Diagnosis Date  . CVA (cerebral infarction)     a. 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 b. Residual aphasia and torticollis. Does not ambulate - uses a motorized chair.  Marland Kitchen TIA (transient ischemic attack)     a. 2013 around time of pneumonia - x3  . Pneumonia   . History of jejunostomy tube placement     a. Per wife due to strokes.  . Irregular heartbeat     a. Per wife. No hx of blood thinners or complications from blood thinners.  . Enlarged heart     a. Per wife.  Marland Kitchen HTN (hypertension)   . Pulmonary mass     a. Wife states has been followed with serial cxrs.     Most Recent Cardiac Studies: None   Surgical History: No past surgical history on file.   Home Meds: Prior to Admission medications   Medication Sig Start Date End Date Taking? Authorizing Provider  aspirin 325 MG tablet Take 325 mg by mouth daily.   Yes Historical Provider, MD  atenolol (TENORMIN) 50 MG tablet Take 75 mg by mouth 2 (two) times daily.  Yes Historical Provider, MD  diazepam (VALIUM) 5 MG tablet Take 5 mg by mouth daily.   Yes Historical Provider, MD  hydrochlorothiazide (HYDRODIURIL) 25 MG tablet Take 25 mg by mouth daily.   Yes Historical Provider, MD  oxybutynin (DITROPAN) 5 MG tablet Take 5 mg by mouth daily.   Yes Historical Provider, MD  potassium chloride (K-DUR) 10 MEQ tablet Take 20 mEq by mouth daily.   Yes Historical Provider, MD  ramipril (ALTACE) 10 MG capsule Take 10 mg by mouth 2 (two) times daily.   Yes Historical Provider, MD  simvastatin (ZOCOR) 40 MG tablet Take 40 mg by mouth at bedtime.   Yes Historical Provider, MD       Allergies: No Known Allergies  History   Social History  . Marital Status: N/A    Spouse Name: N/A    Number of Children: N/A  . Years of Education: N/A   Occupational History  . Not on file.   Social History Main Topics  . Smoking status: Never Smoker   . Smokeless tobacco: Not on file  . Alcohol Use: No  . Drug Use: No  . Sexual Activity: Not on file   Other Topics Concern  . Not on file   Social History Narrative   Lives with wife who has been primary care giver. Married 50 yrs as of 08/2012. They have a son but do not see him regularly.     Family History  Problem Relation Age of Onset  . Stroke Father     Review of Systems: unable to obtain due to intubation/sedation  Labs: Istat Hgb 16.7, Hc 49, Na 144, K 4.0, Glu 203, BUN 21, Cr 1.4  Radiology/Studies:  Dg Chest Portable 1 View 04/09/2013   CLINICAL DATA:  72 year old male with shortness of Breath status post intubation. Initial encounter.  EXAM: PORTABLE CHEST - 1 VIEW  COMPARISON:  None.  FINDINGS: Portable AP supine view at 0727 hrs. Intubated. Endotracheal tube tip in good position just below the clavicles. Enteric tube courses to the abdomen, tip not included.  Scoliosis. There is cardiomegaly. Other mediastinal contours are within normal limits.  Nodular and confluent opacity throughout the right lung, and also suspected at the left lung base. Left lung base partially obscured by pacer/resuscitation pads. No pneumothorax or definite effusion.  IMPRESSION: 1. Endotracheal tube in good position. Enteric tube courses to the abdomen, tip not included.  2. Multilobar right lung airspace disease. Involvement also suspected at the left lung base. Top considerations are large volume aspiration versus multilobar pneumonia.   Electronically Signed   By: Augusto Gamble M.D.   On: 04/09/2013 07:40    EKG:  6:26 - atrial fib RVR 155bpm ST elevation II, avF, V2-V6 (up to 1mm V4-V5), TWI I, avL, V5-V6 7:32 - NSR with PVCs,  PACs residual ST elevation II, III, avF, V3-V6 with TWI I, avL, V5-V6  Physical Exam: Blood pressure 146/99, pulse 78, temperature 98.8 F (37.1 C), resp. rate 26, height 5\' 5"  (1.651 m), weight 180 lb (81.647 kg), SpO2 96.00%. General: Chronically ill AAM intubated, sedated Head: Atraumatic, sclera non-icteric, no xanthomas. Torticollis present.  Neck: JVD not elevated. Lungs: Intubated. Diffuse rhonchi throughout. Heart: Difficult to hear over lung sounds but RRR. No murmurs, rubs, or gallops appreciated. Abdomen: Soft, rounded with normoactive bowel sounds. No rebound/guarding.  Msk:  Strength and tone appear normal for age. Extremities: No clubbing or cyanosis. No edema.  Distal pedal pulses are 2+ and equal bilaterally.  Neuro: Sedated. Nonresponsive.     ASSESSMENT AND PLAN:  1. Acute respiratory failure with probable aspiration pneumonia 2. Hypotension then hypertension 3. Acute inferolateral STEMI/CAD 4. Ventricular tachycardia s/p defibrillation x 1 5. Atrial fibrillation with RVR  6. Multiple CVA/TIAs with chronic J-tube, scooter-bound, aphasia, torticollis-like neck alignment 7. Pulmonary mass per wife followed by serial CXRs recently  Per discussion with Dr. [place], continue IV NTG, heparin, aspirin. Load with 300mg  Plavix then 75mg  daily. Cycle troponins. Check blood cx prior to antibiotic initiation. Change atenolol to metoprolol 25mg  q6hr. Hold HCTZ, KCl, ACEI. Will continue amiodarone drip for now given VT. Change statin to lipitor. PCCM has been consulted for pulmonary issues which may have driven his overall presentation.  Code status: The wife states the patient desired to be full-code. She is his POA.  Signed, Ronie Spies PA-C 04/09/2013, 8:27 AM  Patient seen and examined and history reviewed. Agree with above findings and plan. Patient is a 72 year old black male who is chronically debilitated from multiple prior CVAs. [name] [name] is aphasic and has a feeding jejunostomy  tube in place. [name] [name] presents with acute respiratory failure. In the emergency department [name] [name] developed agonal breathing and required intubation. ECG initially showed atrial fibrillation with a rapid ventricular response. [name] [name] had diffuse ST elevation more pronounced in the inferior lateral leads. In the emergency department [name] [name] developed sustained ventricular tachycardia requiring defibrillation. [name] [name] was started on IV amiodarone. ECG shows persistent ST elevation. In discussion with the wife the family would like aggressive treatment and the patient is a full CODE BLUE. We have recommended emergent cardiac catheterization to define his coronary anatomy and treatment options. Chest x-ray shows dense right lung pneumonia. Patient will be started on IV antibiotics. We'll consult critical care management.  Theron Arista [facility]  04/09/2013 8:55 AM

## 2013-04-09 NOTE — Consult Note (Signed)
PULMONARY  / CRITICAL CARE MEDICINE  Name: [name] [name] MRN: [id number] DOB: [birth year]    ADMISSION DATE:  04/09/2013 CONSULTATION DATE:  04/09/13 at [facility]  REFERRING MD :  Dr. Swaziland PRIMARY SERVICE: PCCM  CHIEF COMPLAINT:  Shortness of Breath, STEMI, Respiratory Failure, Multifocal Pneumonia  BRIEF PATIENT DESCRIPTION: [name] [name] is a 72 year old male with multiple CVA/TIAs with aphasia/torticollis/PEG, Chronic Atrial Fibrillation, Enlarge Heart, Pulmonary Mass, HTN came to the ED for shortness of breath.  He was found to have STEMI, Atrial Fibrillation, Respiratory Failure, VT, Hypotention then Hypertension, and CAP/PNA in the ED.  He was shock was once in the ED for sustained VT, given Amiodarone, Intubated, and taken to the Cath Lab by cardiology.  Catheterization reveal 95% occluded RCA but no intervention was done due non dominant nature.  He is now back in the cardiac ICU for further treatment post cardiac cath.  SIGNIFICANT EVENTS / STUDIES:  11/21 >>> CXR with proper ET tube placement and multilobar PNA 11/21 >>> Cardiac Cath, 95% occluded RCA, no intervention due to non dominant nature 11/21 >>> Respiratory failure required intubation  LINES / TUBES: Peripheral IVs 11/21 >>> Foley Catheter 11/21 >>> PEG Tube ?Date? >>> Right Femoral Venous Line 11/21 >>> Right Femoral A-Line 11/21 >>>  CULTURES: 11/21 Sputum culture >>> 11/21 Blood culture >>> 11/21 Urine culture >>> 11/21 Influenza swab>>  ANTIBIOTICS: None  HISTORY OF PRESENT ILLNESS:  [name] [name] is a 72 year old male with multiple CVA/TIAs with aphasia/torticollis/PEG, Chronic Atrial Fibrillation/Irregular heart beat, Enlarge Heart, Pulmonary Mass, HTN came to the ED for shortness of breath.  At the time of evaluation, he was intubated and sedated so did no provide history.  According to EMR, he was found to have respiratory failure, VT, and STEMI on presentation.  He was intubated in the ED for respiratory  support.  He was shock once for VT and covert back to NSR.  He was given Zosyn for possible multilobar PNA on chest X-Ray.  He was then taken to the Cath Lab for intervention.  He was found to have 95% RCA occlusion but no intervention was done due to nondominant and possibly good collateral.  She is now in the ICU for further management.  He is currently vent and sedated in no acute respiratory distress.  PAST MEDICAL HISTORY :  Past Medical History  Diagnosis Date  . CVA (cerebral infarction)     a. 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 b. Residual aphasia and torticollis. Does not ambulate - uses a motorized chair.  Marland Kitchen TIA (transient ischemic attack)     a. 2013 around time of pneumonia - x3  . Pneumonia   . History of jejunostomy tube placement     a. Per wife due to strokes.  . Irregular heartbeat     a. Per wife. No hx of blood thinners or complications from blood thinners.  . Enlarged heart     a. Per wife.  Marland Kitchen HTN (hypertension)   . Pulmonary mass     a. Wife states has been followed with serial cxrs.   No past surgical history on file. Prior to Admission medications   Medication Sig Start Date End Date Taking? Authorizing Provider  aspirin 325 MG tablet 325 mg by Per J Tube route daily.    Yes Historical Provider, MD  atenolol (TENORMIN) 50 MG tablet 75 mg by Per J Tube route 2 (two) times daily.    Yes Historical Provider, MD  diazepam (VALIUM) 5 MG tablet 5 mg by Per J Tube route daily.    Yes Historical Provider, MD  hydrochlorothiazide (HYDRODIURIL) 25 MG tablet 25 mg by Per J Tube route daily.    Yes Historical Provider, MD  oxybutynin (DITROPAN) 5 MG tablet 5 mg by Per J Tube route daily.    Yes Historical Provider, MD  potassium chloride (K-DUR) 10 MEQ tablet 20 mEq by Per J Tube route daily.    Yes Historical Provider, MD  ramipril (ALTACE) 10 MG capsule 10 mg by Per J Tube route 2 (two) times daily.    Yes Historical Provider, MD  simvastatin (ZOCOR) 40 MG tablet 40 mg by  Per J Tube route at bedtime.    Yes Historical Provider, MD   No Known Allergies  FAMILY HISTORY:  Family History  Problem Relation Age of Onset  . Stroke Father    SOCIAL HISTORY:  reports that he has never smoked. He does not have any smokeless tobacco history on file. He reports that he does not drink alcohol or use illicit drugs.  REVIEW OF SYSTEMS:  Not possible due to vent and sedation  SUBJECTIVE:  See HPI  VITAL SIGNS: Temp:  [98.6 F (37 C)-98.8 F (37.1 C)] 98.8 F (37.1 C) (11/21 0731) Pulse Rate:  [38-151] 71 (11/21 0845) Resp:  [19-30] 20 (11/21 0845) BP: (146-160)/(99-119) 159/113 mmHg (11/21 0845) SpO2:  [72 %-100 %] 93 % (11/21 0845) FiO2 (%):  [100 %] 100 % (11/21 0845) Weight:  [81.647 kg (180 lb)] 81.647 kg (180 lb) (11/21 0710) HEMODYNAMICS:   VENTILATOR SETTINGS: Vent Mode:  [-] PRVC FiO2 (%):  [100 %] 100 % Set Rate:  [20 bmp] 20 bmp Vt Set:  [500 mL] 500 mL PEEP:  [8 cmH20] 8 cmH20 Plateau Pressure:  [23 cmH20] 23 cmH20 INTAKE / OUTPUT: Intake/Output   None     PHYSICAL EXAMINATION: General:  Ill appearing male lying in bed in no apparent acute distress with ET and sedation Neuro:  Vent and Sedated HEENT:  NCAT, coarse dry hair, no lesion,     EOMI, no discharge, no pus, no crust, no sign of infection     Intact, no discharge, no abnormality noted     Intact, no discharge, no congestion     Dry mucosal, ET tube in place Cardiovascular:  NSR with PVC on tracing, s1 and s2 heard, no murmur/rub/gallop Lungs:  Coarse bilaterally due to vent, no wheezing Abdomen:  PEG in epigastric, no sign of drainage or discharge         Soft, non tender, non distended, no guarding, (+)BS, no ascites Musculoskeletal:  Intact, atrophic LE, no edema, positive and equal pulses Skin:  Dry and scaly in LE, warm to touch, no lesion  LABS:  CBC  Recent Labs Lab 04/09/13 0755  WBC 10.6*  HGB 15.0  HCT 46.2  PLT 173   Coag's  Recent Labs Lab  04/09/13 0755  APTT 24  INR 1.27   BMET No results found for this basename: NA, K, CL, CO2, BUN, CREATININE, GLUCOSE,  in the last 168 hours Electrolytes No results found for this basename: CALCIUM, MG, PHOS,  in the last 168 hours Sepsis Markers No results found for this basename: LATICACIDVEN, PROCALCITON, O2SATVEN,  in the last 168 hours ABG No results found for this basename: PHART, PCO2ART, PO2ART,  in the last 168 hours Liver Enzymes No results found for this basename: AST, ALT, ALKPHOS, BILITOT, ALBUMIN,  in the  last 168 hours Cardiac Enzymes No results found for this basename: TROPONINI, PROBNP,  in the last 168 hours Glucose No results found for this basename: GLUCAP,  in the last 168 hours  Imaging Dg Chest Portable 1 View  04/09/2013   CLINICAL DATA:  72 year old male with shortness of Breath status post intubation. Initial encounter.  EXAM: PORTABLE CHEST - 1 VIEW  COMPARISON:  None.  FINDINGS: Portable AP supine view at 0727 hrs. Intubated. Endotracheal tube tip in good position just below the clavicles. Enteric tube courses to the abdomen, tip not included.  Scoliosis. There is cardiomegaly. Other mediastinal contours are within normal limits.  Nodular and confluent opacity throughout the right lung, and also suspected at the left lung base. Left lung base partially obscured by pacer/resuscitation pads. No pneumothorax or definite effusion.  IMPRESSION: 1. Endotracheal tube in good position. Enteric tube courses to the abdomen, tip not included.  2. Multilobar right lung airspace disease. Involvement also suspected at the left lung base. Top considerations are large volume aspiration versus multilobar pneumonia.   Electronically Signed   By: Augusto Gamble M.D.   On: 04/09/2013 07:40     CXR:   11/21 >>> Possible large volume aspirate in right lung, possible multilobar PNA  ASSESSMENT / PLAN:  PULMONARY A: Respiratory Failure likely secondary to PNA and VT on  admission  Hypoxemia from aspiration     Large volume aspirated versus multilobar PNA/CAP  No elevated WBC, no typical PNA symptoms per wife P:   - Adequate oxygenation and is synchronous breathing on vent - Increase RR, wean FiO2 - Fentanyl drip for sedation - Rocephin and Azithromycin for CAP - Sputum/Blood/Urine culture - Trending lactic acid  CARDIOVASCULAR A: VT s/p Shock x1 now in SR with intermittent PVC on Amiodarone drip     CAD 95% Occluded RCA     STEMI s/p Cardiac Cath without intervention     Hypertension on History     Possible Chronic Atrial Fibrillation, Rate Control without anticoagulation per wife P:  - Continue heparin drip per cardiology until sheet come out - ASA, BB, NGT, Plavix, Statins - Check lipid  - Continue Amiodarone about 24 hours - Treat blood pressure as needed - Trend troponin  RENAL A:  No Documented Problem Currently      History of urethral tear from Foley per wife P:   - Check CMP - Urinalysis and urine culture  GASTROINTESTINAL A:  S/p PEG for diet supplement due to CVA       Patient can still performed oral intake P:   - Resume tube feed ASAP  HEMATOLOGIC A:  No Current Problem P:  - Monitor CBC  INFECTIOUS A:  Multilobar PNA on Chest X-ray P:   - Empiric coverage for CAP - Sputum/blood culture  ENDOCRINE A:  No Documented Problem   P:   - ISS for BG control  NEUROLOGIC A:  Encephalopathy due to sedative medication on vent P:   - AAOx3 at baseline with aphasia and wheelchair dependent - Will d/c vent and sedation as soon as possible  TODAY'S SUMMARY:  VT, PNA/CAP, STEMI, Respiratory Failure on Vent, and s/p Cardiac Cath without intervention.  Keep current vent setting until ABG, Sedate with Fentanyl, empiric CAP Coverage.  Continue Heparin/Amiodarone drip.  CAD/STEMI core measures.  I have seen and examined [name] [name] with Dr. Laney Potash.  Will wean FiO2 and increase RR now, cover for CAP, will need aspiration  evaluation post extubation.  STEMI  coverage per cardiology  I have personally obtained a history, examined the patient, evaluated laboratory and imaging results, formulated the assessment and plan and placed orders. CRITICAL CARE: The patient is critically ill with multiple organ systems failure and requires high complexity decision making for assessment and support, frequent evaluation and titration of therapies, application of advanced monitoring technologies and extensive interpretation of multiple databases. Critical Care Time devoted to patient care services described in this note is 45 minutes.   Yolonda Kida PCCM Pager: [phone] Cell: [phone] If no response, call [phone]  04/09/2013, 9:07 AM

## 2013-04-09 NOTE — ED Provider Notes (Signed)
CSN: [id number]     Arrival date & time 04/09/13  6440 History   None    Chief Complaint  Patient presents with  . Code STEMI   (Consider location/radiation/quality/duration/timing/severity/associated sxs/prior Treatment) HPI Comments: 72 year old male brought in by EMS for shortness of breath. The history is unobtainable from the patient due to altered mental status. Patient lives at home has a G-tube. He does not know his normal mental status. Cough or shortness but that started yesterday and found him in atrial fibrillation with RVR and shortness of breath with hypoxia. Initially he did have a low blood pressure in the 70s. He went into ventricular tachycardia with EMS and was shocked and given amiodarone. He did seem to be responding to them initially in the patellar short of breath, however in the ambulance he became more distressed and eventually agonal. He did talk to family and noted that he was a full code.   No past medical history on file. No past surgical history on file. No family history on file. History  Substance Use Topics  . Smoking status: Not on file  . Smokeless tobacco: Not on file  . Alcohol Use: Not on file    Review of Systems  Unable to perform ROS: Mental status change    Allergies  Review of patient's allergies indicates not on file.  Home Medications  No current outpatient prescriptions on file. BP 146/99  Pulse 78  Temp(Src) 98.8 F (37.1 C)  Resp 26  Ht 5\' 5"  (1.651 m)  Wt 180 lb (81.647 kg)  BMI 29.95 kg/m2  SpO2 96% Physical Exam  Nursing note and vitals reviewed. Constitutional: He appears cachectic.  HENT:  Head: Normocephalic and atraumatic.  Right Ear: External ear normal.  Left Ear: External ear normal.  Nose: Nose normal.  Eyes: Right eye exhibits no discharge. Left eye exhibits no discharge.  Neck: Neck supple.  Head and neck turned to the left  Cardiovascular: Normal heart sounds and intact distal pulses.  An irregular rhythm  present. Tachycardia present.   Pulmonary/Chest: He has rhonchi. He has rales.  Abdominal: Soft. He exhibits no distension.  Gtube in place  Musculoskeletal: He exhibits no edema.  Neurological: He is unresponsive. GCS eye subscore is 4. GCS verbal subscore is 1. GCS motor subscore is 1.  Skin: Skin is warm and dry.    ED Course  INTUBATION Date/Time: 04/09/2013 7:35 AM Performed by: Pricilla Loveless T Authorized by: Pricilla Loveless T Consent: The procedure was performed in an emergent situation. Patient identity confirmed: anonymous protocol, patient vented/unresponsive Indications: respiratory failure, airway protection and hypoxemia Intubation method: video-assisted Patient status: paralyzed (RSI) Preoxygenation: nonrebreather mask Sedatives: etomidate Paralytic: rocuronium Tube size: 7.5 mm Tube type: cuffed Number of attempts: 1 Cords visualized: yes Post-procedure assessment: chest rise and CO2 detector Breath sounds: equal Cuff inflated: yes ETT to lip: 23 cm Tube secured with: ETT holder Chest x-ray interpreted by me. Chest x-ray findings: endotracheal tube in appropriate position Patient tolerance: Patient tolerated the procedure well with no immediate complications.  IO LINE INSERTION Date/Time: 04/09/2013 7:36 AM Performed by: Pricilla Loveless T Authorized by: Pricilla Loveless T Consent: The procedure was performed in an emergent situation. Time out: Immediately prior to procedure a "time out" was called to verify the correct patient, procedure, equipment, support staff and site/side marked as required. Indications: fluid administration, medication administration and rapid vascular access Local anesthesia used: no Insertion site: left proximal tibia Site preparation: alcohol Preparation: Patient was prepped and  draped in the usual sterile fashion. Insertion device: drill device Insertion: needle was inserted through the bony cortex Number of attempts:  1 Confirmation method: stability of the needle, easy infusion of fluids and aspiration of blood/marrow Patient tolerance: Patient tolerated the procedure well with no immediate complications. CARDIOVERSION Date/Time: 04/09/2013 7:37 AM Performed by: Pricilla Loveless T Authorized by: Pricilla Loveless T Consent: The procedure was performed in an emergent situation. Patient identity confirmed: anonymous protocol, patient vented/unresponsive Cardioversion basis: emergent Pre-procedure rhythm: ventricular tachycardia Patient position: patient was placed in a supine position Chest area: chest area exposed Electrodes: pads Electrodes placed: anterior-posterior Number of attempts: 1 Attempt 1 mode: asynchronous Attempt 1 shock (in Joules): 200 Attempt 1 outcome: conversion to normal sinus rhythm Post-procedure rhythm: normal sinus rhythm with frequent PVCs. Complications: no complications Patient tolerance: Patient tolerated the procedure well with no immediate complications.   (including critical care time) Labs Review Labs Reviewed  CBC WITH DIFFERENTIAL  PROTIME-INR  URINALYSIS, ROUTINE W REFLEX MICROSCOPIC   Imaging Review Dg Chest Portable 1 View  04/09/2013   CLINICAL DATA:  72 year old male with shortness of Breath status post intubation. Initial encounter.  EXAM: PORTABLE CHEST - 1 VIEW  COMPARISON:  None.  FINDINGS: Portable AP supine view at 0727 hrs. Intubated. Endotracheal tube tip in good position just below the clavicles. Enteric tube courses to the abdomen, tip not included.  Scoliosis. There is cardiomegaly. Other mediastinal contours are within normal limits.  Nodular and confluent opacity throughout the right lung, and also suspected at the left lung base. Left lung base partially obscured by pacer/resuscitation pads. No pneumothorax or definite effusion.  IMPRESSION: 1. Endotracheal tube in good position. Enteric tube courses to the abdomen, tip not included.  2. Multilobar  right lung airspace disease. Involvement also suspected at the left lung base. Top considerations are large volume aspiration versus multilobar pneumonia.   Electronically Signed   By: Augusto Gamble M.D.   On: 04/09/2013 07:40    EKG Interpretation    Date/Time:  Friday April 09 2013 07:32:41 EST Ventricular Rate:  87 PR Interval:    QRS Duration: 108 QT Interval:  406 QTC Calculation: 488 R Axis:   67 Text Interpretation:  Normal Sinus rhythm Multiple premature complexes, vent  Second deg AVB, Mobitz I (Wenckebach) Inferior infarct, acute (LCx) Anterolateral infarct ** ** ACUTE MI / STEMI ** ** No old tracing to compare Confirmed by Annabel Gibeau  MD, Daralyn Bert ([id number]) on 04/09/2013 7:43:28 AM            MDM   1. Respiratory failure   2. Ventricular tachycardia   3. STEMI (ST elevation myocardial infarction)    Patient unresponsive with poor respiratory effort on arrival. Confirmed with EMS he was a full code, and was RSI'd for airway protection. Cardioverted ventricular tachycardia as he was about to be intubated, with return of NSR. Intubated with one attempt, multiple secretions noted. Hard to oxygenate initially, had to go to 100% FIO2 and increase PEEP. Xray concerning for aspiration vs PNA, will cover with zosyn initially. Dr. Swaziland present as patient was called a Code STEMI in the field, and his team has talked to family who has given a better history and agrees to cath. EKG here concerning for STEMI as well. Patient is a very difficult access for IVs, so an IO was placed for access as patient is going to cath lab. Dr. Swaziland states they will get further bloodwork with a central line in the cath lab.  Thus, unable to get blood cultures or other labs prior to abx. Patient was given rectal ASA and heparin bolus prior to leaving.    Audree Camel, MD 04/09/13 [phone]

## 2013-04-09 NOTE — Progress Notes (Signed)
MD notified about hypotension. Assessed pt and no further orders made. Will continue to monitor.

## 2013-04-09 NOTE — Progress Notes (Signed)
ANTICOAGULATION CONSULT NOTE - Follow Up Consult  Pharmacy Consult for Heparin Indication: chest pain/ACS  No Known Allergies  Patient Measurements: Height: 5\' 6"  (167.6 cm) Weight: 158 lb 1.1 oz (71.7 kg) IBW/kg (Calculated) : 63.8   Vital Signs: Temp: 99.8 F (37.7 C) (11/21 2000) Temp src: Oral (11/21 2000) BP: 132/104 mmHg (11/21 1930) Pulse Rate: 59 (11/21 2000)  Labs:  Recent Labs  04/09/13 0755 04/09/13 0800 04/09/13 1115 04/09/13 1517 04/09/13 2012  HGB 15.0 16.7  --   --   --   HCT 46.2 49.0  --   --   --   PLT 173  --   --   --   --   APTT 24  --   --   --   --   LABPROT 15.6*  --   --   --   --   INR 1.27  --   --   --   --   HEPARINUNFRC  --   --  0.51  --  0.52  CREATININE 1.17 1.40*  --   --   --   CKTOTAL 57  --   --   --   --   CKMB 2.4  --   --   --   --   TROPONINI <0.30  --   --  0.63*  --     Estimated Creatinine Clearance: 43.7 ml/min (by C-G formula based on Cr of 1.4).   Assessment: 72yom admitted for ACS > STEMI > cath showed ruptured plaque with no intervention -leave sheath in and restarted heparin after cath.  Heparin drip 850 uts/hr  HL at goal 0.52, no bleeding noted, CBC stable.  Goal of Therapy:  Heparin level 0.3-0.7 units/ml Monitor platelets by anticoagulation protocol: Yes   Plan:   Heparin drip 950 uts/hr  Daily CBC, heparin level  Leota Sauers Pharm.D. CPP, BCPS Clinical Pharmacist [phone] 04/09/2013 10:25 PM

## 2013-04-09 NOTE — Progress Notes (Signed)
[id number]- Rec'd pt in cath lab from ED RT.  No apparent respiratory complications noted during cath procedure.  Pt then transported to ICU bed (w/ no apparent complications during transport) and remains on vent settings pt was placed on in ED.  CCM rounding on pt.  Pt presently on PRVC 500vt, rate 20, peep 8, 100%, waiting on vent orders.  ICU RT given report on this pt.

## 2013-04-09 NOTE — Progress Notes (Signed)
CRITICAL VALUE ALERT  Critical value received: Troponin 0.63  Date of notification:  04/09/13  Time of notification:  2028  Critical value read back:yes  Nurse who received alert:  Jorge Ny RN  Expected value post cath. Will continue to monitor.

## 2013-04-09 NOTE — ED Notes (Signed)
Family at bedside. Verbalize understanding of plan of care.

## 2013-04-09 NOTE — Care Management Note (Addendum)
Page 1 of 2   04/30/2013     1:32:01 PM   CARE MANAGEMENT NOTE 04/30/2013  Patient:  [name] [name],[name] [name]   Account Number:  [id number]  Date Initiated:  04/09/2013  Documentation initiated by:  Junius Creamer  Subjective/Objective Assessment:   adm w mi, vent     Action/Plan:   lives w wife, pt was confined to motorized w/c pta from prev strokes   Anticipated DC Date:     Anticipated DC Plan:  HOME W HOME HEALTH SERVICES      DC Planning Services  CM consult      [facility] Choice  Resumption Of Svcs/PTA Provider   Choice offered to / List presented to:     DME arranged  TRACH SUPPLIES  SUCTION  TUBE FEEDING  TUBE FEEDING PUMP        HH arranged  HH-1 RN  HH-7 RESPIRATORY THERAPY  HH-5 SPEECH THERAPY      HH agency  [facility]   Status of service:  Completed, signed off Medicare Important Message given?   (If response is "NO", the following Medicare IM given date fields will be blank) Date Medicare IM given:   Date Additional Medicare IM given:    Discharge Disposition:  HOME W HOME HEALTH SERVICES  Per UR Regulation:  Reviewed for med. necessity/level of care/duration of stay  If discussed at Long Length of Stay Meetings, dates discussed:   04/20/2013  04/22/2013  04/27/2013  04/29/2013    Comments:   04-30-13 1327 Tomi Bamberger, RN,BSN [phone] CM did speak to Kennon Rounds and she stated to call Nita with Pharmacy for assistance. Pt will get a 7 day supply of protonix and amio filled by main Pharmacy. Wife will get the other Rx's via [facility] $4.00 list. The origianl Rx's were sent to [facility] and should hopefully be delivered to pt by Wed of next week. Wife is aware that if mail order has not arrived to call [facility]. No further needs from CM at this time. Pt will go home via ambulance.   04-30-13 [address] [place] [phone] CM did fax d/c summary to [facility]. Pt's wife  states that  she only has 25.00 in bank account and pt is on 9 new medications. CM did call Manufacturing systems engineer to see if we could use the [facility] Program just to d/c the pt. CM did touch base with Dayna PA to see if some medications can be tweaked.    04/28/13 0935 Henrietta Mayo RN MSN BSN CCM Dr Redmond School with [facility] is PCP - TC to his office to determine documentation needed re hospitalization, time of next scheduled visit.  VM message left with triage nurse, Victorino Dike [phone]) requesting return call. 1000 TC from [place] who states their office will be able to manage pt's trach.  She requests faxed d/c summary to [phone]. 1445 TC from Levora Dredge, NP with [facility] who provides primary care/home visits.  She requests home health nurse contact her @ [phone] for issues M-F, leave message @ [phone] after hours and weekends.  Information relayed to [facility] liaison.  [employer] [facility] will arrange delivery of equipment tomorrow.  04/27/13 0930 Henrietta Mayo RN MSN BSN CCM Spouse has decided that she will learn to do trach care and take pt home, [facility] is already active with nursing.  RT is planning formal teaching tomorrow.  Will request staff nurses provide instruction as well. 1515 Pt's O2  sats 96-100% off O2.  12/2  1106 debbie dowell rn,bsn had spoken w ltac's and no ltac benefit in pt's ins. have req formal eval to be sure his policy has not benefit. have made sw ref for vent snf also. will cont to follow. was trach on 12-1.met w pt and wife to let them know that ck about ltac benefits but it looks as though this is not option and sw will assist w vent snf.  11/24 1300p debbie dowell rn,bsn [facility] aware of pt's hosp.

## 2013-04-09 NOTE — ED Notes (Signed)
Called to home for respiratory distress.  Upon arrival, pt. Having frothy/pink sputum, rales throughout, agonal respirations with BVM assist; HR ? 90 150's - EMs hung 150 mg iv bolus.  POA wants everything done.

## 2013-04-09 NOTE — Progress Notes (Signed)
ANTICOAGULATION CONSULT NOTE - Initial Consult  Pharmacy Consult for Heparin Indication: chest pain/ACS  No Known Allergies  Patient Measurements: Height: 5\' 5"  (165.1 cm) Weight: 180 lb (81.647 kg) IBW/kg (Calculated) : 61.5  Vital Signs: Temp: 98.8 F (37.1 C) (11/21 0731) BP: 146/99 mmHg (11/21 0715) Pulse Rate: 78 (11/21 0736)  Labs:  Recent Labs  04/09/13 0755  HGB 15.0  HCT 46.2  PLT 173    CrCl is unknown because no creatinine reading has been taken.   Medical History: Past Medical History  Diagnosis Date  . CVA (cerebral infarction)     a. 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 b. Residual aphasia and torticollis. Does not ambulate - uses a motorized chair.  Marland Kitchen TIA (transient ischemic attack)     a. 2013 around time of pneumonia - x3  . Pneumonia   . History of jejunostomy tube placement     a. Per wife due to strokes.  . Irregular heartbeat     a. Per wife. No hx of blood thinners or complications from blood thinners.  . Enlarged heart     a. Per wife.  Marland Kitchen HTN (hypertension)   . Pulmonary mass     a. Wife states has been followed with serial cxrs.    Medications:  Prescriptions prior to admission  Medication Sig Dispense Refill  . aspirin 325 MG tablet Take 325 mg by mouth daily.      Marland Kitchen atenolol (TENORMIN) 50 MG tablet Take 75 mg by mouth 2 (two) times daily.      . diazepam (VALIUM) 5 MG tablet Take 5 mg by mouth daily.      . hydrochlorothiazide (HYDRODIURIL) 25 MG tablet Take 25 mg by mouth daily.      Marland Kitchen oxybutynin (DITROPAN) 5 MG tablet Take 5 mg by mouth daily.      . potassium chloride (K-DUR) 10 MEQ tablet Take 20 mEq by mouth daily.      . ramipril (ALTACE) 10 MG capsule Take 10 mg by mouth 2 (two) times daily.      . simvastatin (ZOCOR) 40 MG tablet Take 40 mg by mouth at bedtime.        Admit Complaint: 72 y.o.  male  admitted 04/09/2013 with STEMI.  Pharmacy consulted to dose heparin.  PMH: Multiple strokes, PEG tube,  aphasic  Overnight Events: 04/09/2013  Worsening SOB at home, choked on some juice, wife called 911 found with STE,, EMS started [facility] for runs of  VT in truck, in ED breathing became agonal and was in SVT, was DCCV.    Assessment: Anticoagulation: VTE Prophylaxis; ACS, IV heparin, 4000 units and 950 units/hr started in the cath lab.    Infectious Disease: empiric aspiration Antibiotics: 11/21 >> zosyn  Cardiovascular: STEMI; CAD, > Cath with ruptured plaque > no intervention, leaving sheath in, starting heparin Current Weight: 180 lb (81.647 kg)   SBP in 180s Heparin, Amiodarone, NTG, ASA, plavix, metoprolol  Endocrinology: Follow glucose  GI / Nutrition: PEG tube  Neurology/MSK: Sedated on vent CPOT:  RASS:  CAM ICU:   Nephrology/Urology/Electrolytes/Optho:   Pulmonary:  VDRF, intubated in ED ; they have been following a pulmonary mass  PTA Medication Issues: Home Meds Not Ordered: HCTZ, ramipril Follow up home medications with wife  Best Practices: VTE Prophylaxis:  IV heparin  Goal of Therapy:  Heparin level 0.3-0.7 units/ml Monitor platelets by anticoagulation protocol: Yes   Plan:  1. Heparin 4000 units IV given in lab, to  start heparin at 950 units/hr 2. Check heparin level in 8h after ggt starts 3. Follow daily heparin level and CBC 4.  NOTE:: sheath to remain in at least until 11/22, follow up plan as it relates to adjusting heparin.   Thank you for allowing pharmacy to be a part of this patients care team.  Lovenia Kim Pharm.D., BCPS Clinical Pharmacist 04/09/2013 8:31 AM Pager: [phone] Phone: [phone]

## 2013-04-10 ENCOUNTER — Inpatient Hospital Stay (HOSPITAL_COMMUNITY): Payer: Medicare Other

## 2013-04-10 DIAGNOSIS — I219 Acute myocardial infarction, unspecified: Secondary | ICD-10-CM

## 2013-04-10 DIAGNOSIS — I359 Nonrheumatic aortic valve disorder, unspecified: Secondary | ICD-10-CM

## 2013-04-10 LAB — LIPID PANEL
LDL Cholesterol: 16 mg/dL (ref 0–99)
Triglycerides: 56 mg/dL (ref <150)
VLDL: 11 mg/dL (ref 0–40)

## 2013-04-10 LAB — COMPREHENSIVE METABOLIC PANEL
AST: 17 U/L (ref 0–37)
Albumin: 2.5 g/dL — ABNORMAL LOW (ref 3.5–5.2)
Alkaline Phosphatase: 60 U/L (ref 39–117)
BUN: 30 mg/dL — ABNORMAL HIGH (ref 6–23)
Calcium: 7.9 mg/dL — ABNORMAL LOW (ref 8.4–10.5)
Chloride: 105 mEq/L (ref 96–112)
GFR calc Af Amer: 39 mL/min — ABNORMAL LOW (ref >90)
GFR calc non Af Amer: 34 mL/min — ABNORMAL LOW (ref >90)
Potassium: 3.7 mEq/L (ref 3.5–5.1)
Total Bilirubin: 0.5 mg/dL (ref 0.3–1.2)
Total Protein: 6.3 g/dL (ref 6.0–8.3)

## 2013-04-10 LAB — GLUCOSE, CAPILLARY
Glucose-Capillary: 88 mg/dL (ref 70–99)
Glucose-Capillary: 85 mg/dL (ref 70–99)

## 2013-04-10 LAB — CBC
HCT: 37 % — ABNORMAL LOW (ref 39.0–52.0)
Hemoglobin: 12.9 g/dL — ABNORMAL LOW (ref 13.0–17.0)
MCH: 32.6 pg (ref 26.0–34.0)
MCV: 93.4 fL (ref 78.0–100.0)
Platelets: 128 10*3/uL — ABNORMAL LOW (ref 150–400)
RDW: 15 % (ref 11.5–15.5)

## 2013-04-10 LAB — LEGIONELLA ANTIGEN, URINE: Legionella Antigen, Urine: NEGATIVE

## 2013-04-10 LAB — HEPARIN LEVEL (UNFRACTIONATED)
Heparin Unfractionated: 0.26 IU/mL — ABNORMAL LOW (ref 0.30–0.70)
Heparin Unfractionated: 0.14 IU/mL — ABNORMAL LOW (ref 0.30–0.70)
Heparin Unfractionated: 0.23 IU/mL — ABNORMAL LOW (ref 0.30–0.70)

## 2013-04-10 LAB — PROCALCITONIN: Procalcitonin: 113.1 ng/mL

## 2013-04-10 MED ORDER — JEVITY 1.2 CAL PO LIQD
1000.0000 mL | ORAL | Status: DC
  Administered 2013-04-10 – 2013-04-16 (×8): 1000 mL
  Administered 2013-04-17 – 2013-04-18 (×2): 65 mL/h
  Administered 2013-04-18: 1000 mL
  Administered 2013-04-19: 16:00:00
  Administered 2013-04-20: 65 mL/h
  Administered 2013-04-21 – 2013-04-29 (×10): 1000 mL
  Filled 2013-04-10 (×36): qty 1000

## 2013-04-10 MED ORDER — SODIUM CHLORIDE 0.9 % IV BOLUS (SEPSIS)
500.0000 mL | Freq: Once | INTRAVENOUS | Status: AC
  Administered 2013-04-10: 500 mL via INTRAVENOUS

## 2013-04-10 MED ORDER — SODIUM CHLORIDE 0.9 % IV SOLN
INTRAVENOUS | Status: DC
  Administered 2013-04-10: 10 mL/h via INTRAVENOUS
  Administered 2013-04-15: 01:00:00 via INTRAVENOUS

## 2013-04-10 MED ORDER — HEPARIN (PORCINE) IN NACL 100-0.45 UNIT/ML-% IJ SOLN
1400.0000 [IU]/h | INTRAMUSCULAR | Status: DC
  Filled 2013-04-10: qty 250

## 2013-04-10 MED ORDER — INSULIN ASPART 100 UNIT/ML ~~LOC~~ SOLN
0.0000 [IU] | SUBCUTANEOUS | Status: DC
  Administered 2013-04-11 – 2013-04-17 (×18): 2 [IU] via SUBCUTANEOUS
  Administered 2013-04-17: 3 [IU] via SUBCUTANEOUS
  Administered 2013-04-18 – 2013-04-28 (×29): 2 [IU] via SUBCUTANEOUS
  Administered 2013-04-29: 1 [IU] via SUBCUTANEOUS
  Administered 2013-04-29: 2 [IU] via SUBCUTANEOUS

## 2013-04-10 MED ORDER — FENTANYL CITRATE 0.05 MG/ML IJ SOLN
100.0000 ug | INTRAMUSCULAR | Status: DC | PRN
  Administered 2013-04-11 – 2013-04-12 (×3): 100 ug via INTRAVENOUS
  Administered 2013-04-13: 50 ug via INTRAVENOUS
  Administered 2013-04-14 – 2013-04-19 (×5): 100 ug via INTRAVENOUS
  Filled 2013-04-10 (×9): qty 2

## 2013-04-10 MED ORDER — SODIUM CHLORIDE 0.9 % IV SOLN
INTRAVENOUS | Status: DC
  Administered 2013-04-10 – 2013-04-12 (×2): 10 mL/h via INTRAVENOUS
  Administered 2013-04-23: 1000 mL via INTRAVENOUS
  Administered 2013-04-25: 18:00:00 via INTRAVENOUS
  Administered 2013-04-26: 10 mL/h via INTRAVENOUS

## 2013-04-10 MED ORDER — SODIUM CHLORIDE 0.9 % IV SOLN
INTRAVENOUS | Status: DC
  Administered 2013-04-10: 10 mL/h via INTRAVENOUS

## 2013-04-10 NOTE — Progress Notes (Signed)
Dr Sherrine Maples notified of pt BPs trending down, SBP in 60's at times with MAP low mid 50's-low 60's. NS bolus started as ordered. Requested further orders concerning re-starting heparin gtt post sheath pull, at this time Dr Sherrine Maples orders to leave sheath in place and continue heparin gtt.

## 2013-04-10 NOTE — Progress Notes (Signed)
eLink Physician-Brief Progress Note Patient Name: [name] [name] DOB: [birth year] MRN: [id number]  Date of Service  04/10/2013   HPI/Events of Note  Patient with episodes of hypotension now sustained with current BP of 68/49 (55).  Is over 10 liters net positive.  No CVP.  UOP is poor.  eICU Interventions  Plan: 500 cc NS bolus IV tiems one for hypotension   Intervention Category Intermediate Interventions: Hypotension - evaluation and management  [name] 04/10/2013, 2:53 AM

## 2013-04-10 NOTE — Consult Note (Signed)
PULMONARY  / CRITICAL CARE MEDICINE  Name: [name] [name] MRN: [id number] DOB: [birth year]    ADMISSION DATE:  04/09/2013 CONSULTATION DATE:  04/09/2013  REFERRING MD :  Cardiology PRIMARY SERVICE: PCCM  CHIEF COMPLAINT:  Acute respiratory failure  BRIEF PATIENT DESCRIPTION: 72 yo with history of multiple CVA (aphasia, PEG) admitted with respiratory failure in setting of pneumonia and STEMI.  SIGNIFICANT EVENTS / STUDIES:  11/21  Admitted with respiratory failure in setting of pneumonia and STEMI 11/21  Cardiac cath >>> 95% occluded RCA, no intervention due to non dominant nature  LINES / TUBES: Foley 11/21 >>> PEG ??? >>> R fem CVL 11/21 >>> R fem A-Line 11/21 >>>  CULTURES: 11/21 Sputum >>> 11/21 Blood >>> 11/21 Urine >>>  ANTIBIOTICS: Azithromycin 11/21 >>> Ceftriaxone 11/21 >>>  SUBJECTIVE:  No overnight events.  VITAL SIGNS: Temp:  [98.7 F (37.1 C)-100.2 F (37.9 C)] 99.1 F (37.3 C) (11/22 0000) Pulse Rate:  [58-77] 67 (11/22 0600) Resp:  [0-30] 25 (11/22 0600) BP: (97-234)/(70-141) 97/73 mmHg (11/22 0415) SpO2:  [84 %-100 %] 100 % (11/22 0600) Arterial Line BP: (77-193)/(56-131) 96/76 mmHg (11/22 0600) FiO2 (%):  [40 %-100 %] 40 % (11/22 0415) Weight:  [70.2 kg (154 lb 12.2 oz)-71.7 kg (158 lb 1.1 oz)] 70.2 kg (154 lb 12.2 oz) (11/22 0320)  HEMODYNAMICS:   VENTILATOR SETTINGS: Vent Mode:  [-] PRVC FiO2 (%):  [40 %-100 %] 40 % Set Rate:  [25 bmp] 25 bmp Vt Set:  [500 mL] 500 mL PEEP:  [8 cmH20-10 cmH20] 10 cmH20 Plateau Pressure:  [22 cmH20-26 cmH20] 23 cmH20  INTAKE / OUTPUT: Intake/Output     11/21 0701 - 11/22 0700 11/22 0701 - 11/23 0700   I.V. (mL/kg) 10644.1 (151.6)    IV Piggyback 300    Total Intake(mL/kg) 10944.1 (155.9)    Urine (mL/kg/hr) 325    Total Output 325     Net +10619.1            PHYSICAL EXAMINATION: General:  No distress, synchronous Neuro:  Vent and Sedated HEENT:  OETT Cardiovascular:  Irregular, no  murmurs Lungs:  Bilateral air entry, few rhonchi Abdomen:  Soft, PEG site intact, bowel sounds present Musculoskeletal:  Torticollis, contractures Skin:  No rash  LABS:  CBC  Recent Labs Lab 04/09/13 0755 04/09/13 0800 04/10/13 0445  WBC 10.6*  --  17.9*  HGB 15.0 16.7 12.9*  HCT 46.2 49.0 37.0*  PLT 173  --  128*   Coag's  Recent Labs Lab 04/09/13 0755  APTT 24  INR 1.27   BMET  Recent Labs Lab 04/09/13 0755 04/09/13 0800 04/10/13 0445  NA 142 144 140  K 4.0 4.0 3.7  CL 103 105 105  CO2 23  --  18*  BUN 19 21 30*  CREATININE 1.17 1.40* 1.91*  GLUCOSE 207* 203* 128*   Electrolytes  Recent Labs Lab 04/09/13 0755 04/09/13 1000 04/10/13 0445  CALCIUM 8.8  --  7.9*  MG  --  1.6  --    Sepsis Markers No results found for this basename: LATICACIDVEN, PROCALCITON, O2SATVEN,  in the last 168 hours  ABG  Recent Labs Lab 04/09/13 0929 04/09/13 1210  PHART 7.325* 7.369  PCO2ART 43.2 33.2*  PO2ART 101.0* 64.0*   Liver Enzymes  Recent Labs Lab 04/09/13 0755 04/10/13 0445  AST 23 17  ALT 14 12  ALKPHOS 66 60  BILITOT 0.6 0.5  ALBUMIN 3.1* 2.5*   Cardiac Enzymes  Recent Labs Lab  04/09/13 0755 04/09/13 1517  TROPONINI <0.30 0.63*   Glucose No results found for this basename: GLUCAP,  in the last 168 hours  CXR:  11/21 >>> Hardware in good position, improving R airspace disease   ASSESSMENT / PLAN:  PULMONARY A: Acute respiratory failure in setting of pneumonia (CAP vs aspiration) - improving.  Possibly element of pulmonary edema in setting of VT. P:   Goal pH>7.30, SpO2>92 Continuous mechanical support VAP bundle Daily WUA Trend ABG/CXR  CARDIOVASCULAR A: VT resolved with defibrillation. STEMI s/p cardiac cath without intervention. H/o CAD, HTN, AF. 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 P:  Goal MAP 60-65 No indication for vasopressors at this time Cardiology following Amiodarone gtt D/c Nitroglycerin  gtt ASA, Plavix, Lipitor, Metoprolol TTE Trend Troponin  RENAL A: AKI / oliguria. P:   Trend BMP NS@KVO   GASTROINTESTINAL A:  Dysphagia s/p PEG. P:   TF per Nutritionist Protonix for GI Px  HEMATOLOGIC A:  Hemodilution.  Therapeutic anticoagulation for ACS.  NOT chronically anticoagulated for AF (reportedly). P:  Trend CBC Heparin gtt  INFECTIOUS A:  Pneumonia (aspiration vs CAP). P:   Abx / cx as above Send PCT  ENDOCRINE A:  Hyperglycemia. P:   Start SSI  NEUROLOGIC A:  Encephalopathy. H/o multiple CVAs with significant residual deficit. P:   Goal RASS 0 to -1 D/c Fentanyl gtt Fentanyl PRN  I have personally obtained history, examined patient, evaluated and interpreted laboratory and imaging results, reviewed medical records, formulated assessment / plan and placed orders.  CRITICAL CARE:  The patient is critically ill with multiple organ systems failure and requires high complexity decision making for assessment and support, frequent evaluation and titration of therapies, application of advanced monitoring technologies and extensive interpretation of multiple databases. Critical Care Time devoted to patient care services described in this note is 40 minutes.   Lonia Farber, MD Pulmonary and Critical Care Medicine [facility] Pager: [phone]  04/10/2013, 9:13 AM

## 2013-04-10 NOTE — Progress Notes (Signed)
 of Fentanyl gtt wasted in sink with Stephenie Acres, RN.

## 2013-04-10 NOTE — Progress Notes (Signed)
ANTICOAGULATION CONSULT NOTE - Follow Up Consult  Pharmacy Consult for heparin Indication: CAD  Labs:  Recent Labs  04/09/13 0755 04/09/13 0800 04/09/13 1115 04/09/13 1517 04/09/13 2012 04/10/13 0445  HGB 15.0 16.7  --   --   --  12.9*  HCT 46.2 49.0  --   --   --  37.0*  PLT 173  --   --   --   --  128*  APTT 24  --   --   --   --   --   LABPROT 15.6*  --   --   --   --   --   INR 1.27  --   --   --   --   --   HEPARINUNFRC  --   --  0.51  --  0.52 0.26*  CREATININE 1.17 1.40*  --   --   --   --   CKTOTAL 57  --   --   --   --   --   CKMB 2.4  --   --   --   --   --   TROPONINI <0.30  --   --  0.63*  --   --     Assessment: 72yo male now subtherapeutic on heparin after one level at goal after resumed post-cath.  Goal of Therapy:  Heparin level 0.3-0.7 units/ml   Plan:  Will increase heparin gtt by 1-2 units/kg/hr to 1050 units/hr and check level in 8hr.  Vernard Gambles, PharmD, BCPS  04/10/2013,5:49 AM

## 2013-04-10 NOTE — Progress Notes (Signed)
Pt remains hypotensive. MD made aware. 500cc bolus has been ordered and given. Will continue to assess and monitor.

## 2013-04-10 NOTE — Progress Notes (Signed)
Subjective:  Pt hypotensive overnight and was given a 500cc bolus. BP improved.   Objective:  Vital Signs in the last 24 hours: Temp:  [98.7 F (37.1 C)-100.2 F (37.9 C)] 99.1 F (37.3 C) (11/22 0000) Pulse Rate:  [58-77] 67 (11/22 0600) Resp:  [0-30] 25 (11/22 0600) BP: (97-234)/(70-141) 97/73 mmHg (11/22 0415) SpO2:  [84 %-100 %] 100 % (11/22 0600) Arterial Line BP: (77-193)/(56-131) 96/76 mmHg (11/22 0600) FiO2 (%):  [40 %-100 %] 40 % (11/22 0415) Weight:  [154 lb 12.2 oz (70.2 kg)-158 lb 1.1 oz (71.7 kg)] 154 lb 12.2 oz (70.2 kg) (11/22 0320)  Intake/Output from previous day: 11/21 0701 - 11/22 0700 In: 10944.1 [I.V.:10644.1; IV Piggyback:300] Out: 325 [Urine:325]  Physical Exam: Pt is alert, NAD HEENT: normal, ETT in place Neck: JVP appears normal, left deviated torticollis  Lungs: Intubated. CTA bilaterally anterioly, diminished airflow CV: RRR without murmur or gallop Abd: soft, NT, Positive BS, no hepatomegaly Ext: no C/C/E, distal pulses intact and equal Skin: warm/dry no rash Neuro: Alert, nods to questions appropriately, follows commands, right hemiplegia   Lab Results:  Recent Labs  04/09/13 0755 04/09/13 0800 04/10/13 0445  WBC 10.6*  --  17.9*  HGB 15.0 16.7 12.9*  PLT 173  --  128*    Recent Labs  04/09/13 0755 04/09/13 0800 04/10/13 0445  NA 142 144 140  K 4.0 4.0 3.7  CL 103 105 105  CO2 23  --  18*  GLUCOSE 207* 203* 128*  BUN 19 21 30*  CREATININE 1.17 1.40* 1.91*    Recent Labs  04/09/13 0755 04/09/13 1517  TROPONINI <0.30 0.63*    Cardiac Studies: 04/09/13 Left heart catheterization Final Conclusions:  1. Single vessel obstructive coronary disease. Patient does have a critical stenosis in a nondominant right coronary. The large dominant left coronary system is without significant disease.   Recommendations: I recommend medical management of his coronary disease. I do not think that the right coronary lesion is the  cause of his acute decompensation. Chest x-ray indicates a significant right lung pneumonia. It is possible that his ST changes may be related to pericarditis in association with his pneumonia. We will manage him with IV heparin and nitroglycerin. We'll continue aspirin and beta blocker therapy. We will add Plavix. Critical care medicine was consult for management of his pneumonia and respiratory failure. Patient is critically ill and prognosis is poor.    Tele: Sinus rhythm with multiple PVCs  Assessment/Plan:  72yo male with PMH of multiple CVAs and chronic aphasia presented with acute respiratory failure associated with atrial fibrillation a rapid ventricular response, was found to have PNA and diffuse ST elevation more pronounced in the inferior lateral leads on EKG, and in the ED the patient decompensated and required intubation and cardioversion x1 2/2 sustained VT, was started on an amiodarone drip and was transferred to the cath lab for [facility].   1. STEMI: Diffuse ST elevations on EKG, more prominent in the inferior leads. Troponin with mild elevation to 0.63. There was delay in bringing the patient to the cath lab because of his hemodynamic instability and needs for management of his respiratory failure in the emergency department. LHC with mid RCA 95% occlusion, nondominant vessel. No intervention was performed given that the vessel was nondominant, and the pt is to be managed medically on ASA, Plavix,  heparin drip, and nitro drips at this time. Possible that his ST elevations are related to a pericarditis associated with  his PNA. Checking 2D echo today to assess for cardiac dysfunction.   2. Atrial fibrillation with RVR/VT: Pt with h/o chronic afib. On ASA 325 at home as well as Atenolol 75mg  BID. Per his wife, pt rate controlled. Pt in afib on arrival to the ED. Pt with sustained VT requiring cardioversion and he was started on an Amiodarone drip which he remains on this morning. No further  episodes of Vtach. Afib resolved. Pt in sinus rhythm this morning with multiple PVCs on tele.  3. Acute respiratory failure/CAP: Pt with acute respiratory failure requiring intubation. On CXR right multilobar airspace disease with involvement in the left base concerning for PNA were noted. Pt was started on Azithromycin and Rocephin for CAP coverage. Management per PCCM.  4. HTN: Pt on BB, ACEi, and diuretic at home. Holding on admission 2/2 hypotension. He has not required pressors and BP stable.    Genelle Gather, MD Internal Medicine Resident, PGY II [facility] Internal Medicine Program 04/10/2013 8:38 AM   History and all data above reviewed.  Patient examined.  I agree with the findings as above. Awake on the ventilator and denies chest pain or SOB. The patient exam reveals COR:RRR  ,  Lungs: Decreased breath sounds,  Abd: Positive bowel sounds, no rebound no guarding, Ext No edema .  All available labs, radiology testing, previous records reviewed. Agree with documented assessment and plan. Continue medical management and conservative therapy.   Fayrene Fearing [facility]  10:21 AM  04/10/2013

## 2013-04-10 NOTE — Progress Notes (Signed)
  Echocardiogram 2D Echocardiogram has been performed.  Georgian Co 04/10/2013, 3:05 PM

## 2013-04-10 NOTE — Progress Notes (Signed)
NUTRITION FOLLOW UP/CONSULT  Intervention:   - Enteral nutrition; initiate TF of Jevity 1.2 via OGT @ 20 mL/hr continuous. Advance by 10 mL q 4 hrs to goal of 54ml/hr which will provide 1872 calories, 86g protein, free water and meet 101% estimated calorie needs, 102% estimated protein needs - Initiate adult enteral protocol - Recommend nursing get updated weight on pt - Unit RD to continue to monitor   Nutrition Dx:   Inadequate oral intake related to inability to eat as evidenced by vent, NPO - ongoing  Goal:   1. Enteral nutrition; initiation with tolerance. Pt to meet >/=90% estimated needs with nutrition support - not met, but in process of starting TF 2. Wt/wt change; monitor trends - significant weight drop documented   Monitor:   Weights, labs, vent status, TF initiation/tolerance/advancement  Assessment:   Pt admitted with NSTEMI. 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 He has some ability to communication per wife, however is not verbal.  Home regimen is: 1 can Osmolite 1.2 + 1 can Jevity 1.2 in the mornings for a total of 2 cans daily. In addition, pt consumes meals and snacks throughout the day. Wife prepares foods soft and bland, and mashes them into a soft bites. Pt can chew and swallow well at baseline.   Patient is currently intubated on ventilator support.  MV: 12.3 L/min Temp:Temp (24hrs), Avg:99 F (37.2 C), Min:97.3 F (36.3 C), Max:100.2 F (37.9 C)  Propofol: off  RD received consult for TF initiation/management. Per weight trend, pt's weight down 26 pounds in 1 day.   Height: Ht Readings from Last 1 Encounters:  04/09/13 5\' 6"  (1.676 m)    Weight Status:   Wt Readings from Last 1 Encounters:  04/10/13 154 lb 12.2 oz (70.2 kg)    Re-estimated needs:  Kcal: 1847 Protein: 84-98g Fluid: 1.8L/day  Skin: intact  Diet Order: NPO   Intake/Output Summary (Last 24 hours) at 04/10/13 1021 Last data filed at 04/10/13  0900  Gross per 24 hour  Intake 9976.26 ml  Output    425 ml  Net 9551.26 ml    Last BM: PTA   Labs:   Recent Labs Lab 04/09/13 0755 04/09/13 0800 04/09/13 1000 04/10/13 0445  NA 142 144  --  140  K 4.0 4.0  --  3.7  CL 103 105  --  105  CO2 23  --   --  18*  BUN 19 21  --  30*  CREATININE 1.17 1.40*  --  1.91*  CALCIUM 8.8  --   --  7.9*  MG  --   --  1.6  --   GLUCOSE 207* 203*  --  128*    CBG (last 3)  No results found for this basename: GLUCAP,  in the last 72 hours  Scheduled Meds: . antiseptic oral rinse  15 mL Mouth Rinse QID  . aspirin  81 mg Per J Tube Daily  . atorvastatin  40 mg Per Tube q1800  . azithromycin  500 mg Intravenous Q24H  . cefTRIAXone (ROCEPHIN)  IV  2 g Intravenous Q24H  . chlorhexidine  15 mL Mouth Rinse BID  . Chlorhexidine Gluconate Cloth  6 each Topical Q0600  . clopidogrel  75 mg Per Tube Q breakfast  . insulin aspart  0-15 Units Subcutaneous Q4H  . metoprolol tartrate  25 mg Per Tube Q6H  . mupirocin ointment  1 application Nasal BID  .  oxybutynin  5 mg Per Tube Daily  . pantoprazole (PROTONIX) IV  40 mg Intravenous Daily    Continuous Infusions: . sodium chloride 10 mL/hr (04/10/13 0800)  . sodium chloride 10 mL/hr (04/10/13 0800)  . sodium chloride 10 mL/hr (04/10/13 0939)  . amiodarone (NEXTERONE PREMIX) 360 mg/200 mL dextrose 30 mg/hr (04/10/13 0918)  . heparin 1,050 Units/hr (04/10/13 1008)     Levon Hedger MS, RD, LDN [phone] Weekend/After Hours Pager

## 2013-04-10 NOTE — Progress Notes (Signed)
ANTICOAGULATION CONSULT NOTE - Follow Up Consult  Pharmacy Consult for heparin Indication: CAD  Labs:  Recent Labs  04/09/13 0755 04/09/13 0800  04/09/13 1517 04/09/13 2012 04/10/13 0445 04/10/13 1400  HGB 15.0 16.7  --   --   --  12.9*  --   HCT 46.2 49.0  --   --   --  37.0*  --   PLT 173  --   --   --   --  128*  --   APTT 24  --   --   --   --   --   --   LABPROT 15.6*  --   --   --   --   --   --   INR 1.27  --   --   --   --   --   --   HEPARINUNFRC  --   --   < >  --  0.52 0.26* 0.14*  CREATININE 1.17 1.40*  --   --   --  1.91*  --   CKTOTAL 57  --   --   --   --   --   --   CKMB 2.4  --   --   --   --   --   --   TROPONINI <0.30  --   --  0.63*  --   --   --   < > = values in this interval not displayed.  Assessment: 72yo male now subtherapeutic on heparin after one level at goal after resumed post-cath.  Per RN no issues with IV.  No bleeding or complications noted.  Goal of Therapy:  Heparin level 0.3-0.7 units/ml Monitor platelets per protocol: Yes   Plan:  Will increase heparin gtt by 1-2 units/kg/hr to 1200 units/hr and check level in 8hr.  Reece Leader, Loura Back, BCPS  Clinical Pharmacist Pager [phone]  04/10/2013 3:24 PM

## 2013-04-10 NOTE — Progress Notes (Signed)
Sputum specimen collected and sent to lab by RT.  Ancil Boozer, RRT, RCP

## 2013-04-10 NOTE — Progress Notes (Signed)
Dr Herma Carson notified of dropping BPs, with SBP in 60's-70's, but MAP staying 60-65.

## 2013-04-10 NOTE — Progress Notes (Signed)
PA notified of BP trend since bolus. Orders to leave sheath in place.

## 2013-04-11 ENCOUNTER — Inpatient Hospital Stay (HOSPITAL_COMMUNITY): Payer: Medicare Other

## 2013-04-11 LAB — BASIC METABOLIC PANEL
CO2: 21 mEq/L (ref 19–32)
Calcium: 7.6 mg/dL — ABNORMAL LOW (ref 8.4–10.5)
Creatinine, Ser: 1.81 mg/dL — ABNORMAL HIGH (ref 0.50–1.35)
GFR calc Af Amer: 42 mL/min — ABNORMAL LOW (ref >90)
GFR calc non Af Amer: 36 mL/min — ABNORMAL LOW (ref >90)
Glucose, Bld: 103 mg/dL — ABNORMAL HIGH (ref 70–99)
Potassium: 2.8 mEq/L — ABNORMAL LOW (ref 3.5–5.1)
Sodium: 139 mEq/L (ref 135–145)

## 2013-04-11 LAB — CBC
MCH: 31.2 pg (ref 26.0–34.0)
Platelets: 107 10*3/uL — ABNORMAL LOW (ref 150–400)
RBC: 3.3 MIL/uL — ABNORMAL LOW (ref 4.22–5.81)
RDW: 15.4 % (ref 11.5–15.5)

## 2013-04-11 LAB — CORTISOL: Cortisol, Plasma: 14.8 ug/dL

## 2013-04-11 LAB — PROCALCITONIN: Procalcitonin: 92.17 ng/mL

## 2013-04-11 LAB — GLUCOSE, CAPILLARY
Glucose-Capillary: 103 mg/dL — ABNORMAL HIGH (ref 70–99)
Glucose-Capillary: 100 mg/dL — ABNORMAL HIGH (ref 70–99)
Glucose-Capillary: 106 mg/dL — ABNORMAL HIGH (ref 70–99)

## 2013-04-11 LAB — POCT ACTIVATED CLOTTING TIME: Activated Clotting Time: 155 seconds

## 2013-04-11 MED ORDER — POTASSIUM CHLORIDE 20 MEQ/15ML (10%) PO LIQD
40.0000 meq | ORAL | Status: AC
  Administered 2013-04-11 (×2): 40 meq
  Filled 2013-04-11 (×2): qty 30

## 2013-04-11 MED ORDER — HEPARIN (PORCINE) IN NACL 100-0.45 UNIT/ML-% IJ SOLN
1600.0000 [IU]/h | INTRAMUSCULAR | Status: DC
  Administered 2013-04-11: 1500 [IU]/h via INTRAVENOUS
  Filled 2013-04-11 (×2): qty 250

## 2013-04-11 MED ORDER — ATROPINE SULFATE 0.1 MG/ML IJ SOLN
INTRAMUSCULAR | Status: AC
  Filled 2013-04-11: qty 10

## 2013-04-11 MED ORDER — HEPARIN (PORCINE) IN NACL 100-0.45 UNIT/ML-% IJ SOLN: 1500.0000 [IU]/h | INTRAMUSCULAR | Status: DC

## 2013-04-11 MED ORDER — ACETAMINOPHEN 325 MG PO TABS
650.0000 mg | ORAL_TABLET | ORAL | Status: DC | PRN
  Administered 2013-04-11 – 2013-04-29 (×6): 650 mg via ORAL
  Filled 2013-04-11 (×6): qty 2

## 2013-04-11 NOTE — Progress Notes (Signed)
ANTICOAGULATION CONSULT NOTE - Follow Up Consult  Pharmacy Consult for heparin Indication: CAD  Labs:  Recent Labs  04/09/13 0755 04/09/13 0800  04/09/13 1517  04/10/13 0445 04/10/13 1400 04/10/13 2300  HGB 15.0 16.7  --   --   --  12.9*  --   --   HCT 46.2 49.0  --   --   --  37.0*  --   --   PLT 173  --   --   --   --  128*  --   --   APTT 24  --   --   --   --   --   --   --   LABPROT 15.6*  --   --   --   --   --   --   --   INR 1.27  --   --   --   --   --   --   --   HEPARINUNFRC  --   --   < >  --   < > 0.26* 0.14* 0.23*  CREATININE 1.17 1.40*  --   --   --  1.91*  --   --   CKTOTAL 57  --   --   --   --   --   --   --   CKMB 2.4  --   --   --   --   --   --   --   TROPONINI <0.30  --   --  0.63*  --   --   --   --   < > = values in this interval not displayed.  Assessment: 72yo male remains subtherapeutic on heparin after rate increases.  Goal of Therapy:  Heparin level 0.3-0.7 units/ml   Plan:  Will increase heparin gtt by 2 units/kg/hr to 1400 units/hr and check level in 8hr.  Vernard Gambles, PharmD, BCPS  04/11/2013,12:02 AM

## 2013-04-11 NOTE — Progress Notes (Signed)
PULMONARY  / CRITICAL CARE MEDICINE  Name: [name] [name] MRN: [id number] DOB: 1940/07/02    ADMISSION DATE:  04/09/2013 CONSULTATION DATE:  04/09/2013  REFERRING MD :  Cardiology PRIMARY SERVICE: PCCM  CHIEF COMPLAINT:  Acute respiratory failure  BRIEF PATIENT DESCRIPTION: 72 yo with history of multiple CVA (aphasia, PEG) admitted with respiratory failure in setting of pneumonia and STEMI.  SIGNIFICANT EVENTS / STUDIES:  11/21  Admitted with respiratory failure in setting of pneumonia and STEMI 11/21  Cardiac cath >>> 95% occluded RCA, no intervention due to non dominant nature 11/22  TTE >>> EF 45-50%, abnormal relaxation, mild aortic regurgitation  LINES / TUBES: Foley 11/21 >>> PEG ??? >>> R fem CVL 11/21 >>> R fem A-Line 11/21 >>>  CULTURES: 11/21 Blood >>>  11/22 Sputum >>>  ANTIBIOTICS: Azithromycin 11/21 >>> Ceftriaxone 11/21 >>>  SUBJECTIVE:  Weaning well this am on 5/5.  VITAL SIGNS: Temp:  [98.2 F (36.8 C)-100.4 F (38 C)] 99.5 F (37.5 C) (11/23 0700) Pulse Rate:  [66-79] 66 (11/23 0800) Resp:  [0-25] 25 (11/23 0800) BP: (74-99)/(56-68) 89/64 mmHg (11/23 0443) SpO2:  [96 %-100 %] 100 % (11/23 0800) Arterial Line BP: (68-104)/(53-73) 89/63 mmHg (11/23 0800) FiO2 (%):  [40 %] 40 % (11/23 0820) Weight:  [70.2 kg (154 lb 12.2 oz)] 70.2 kg (154 lb 12.2 oz) (11/23 0500)  HEMODYNAMICS:   VENTILATOR SETTINGS: Vent Mode:  [-] PRVC FiO2 (%):  [40 %] 40 % Set Rate:  [25 bmp] 25 bmp Vt Set:  [500 mL] 500 mL PEEP:  [8 cmH20] 8 cmH20 Plateau Pressure:  [20 cmH20-24 cmH20] 20 cmH20  INTAKE / OUTPUT: Intake/Output     11/22 0701 - 11/23 0700 11/23 0701 - 11/24 0700   I.V. (mL/kg) 1830.1 (26.1) 46.7 (0.7)   NG/GT 459.5 55   IV Piggyback 300    Total Intake(mL/kg) 2589.6 (36.9) 101.7 (1.4)   Urine (mL/kg/hr) 535 (0.3) 75 (0.5)   Stool 1 (0)    Total Output 536 75   Net +2053.6 +26.7        Stool Occurrence 1 x      PHYSICAL EXAMINATION: General:   Comfortable on SBT Neuro:  Awake, alert, nonverbal due to intubation, follows commands HEENT:  OETT Cardiovascular:  Irregular, no murmurs Lungs:  Bilateral air entry, few rhonchi Abdomen:  Soft, PEG site intact, bowel sounds present Musculoskeletal:  Torticollis, contractures Skin:  No rash  LABS:  CBC  Recent Labs Lab 04/09/13 0755 04/09/13 0800 04/10/13 0445 04/11/13 0500  WBC 10.6*  --  17.9* 14.9*  HGB 15.0 16.7 12.9* 10.3*  HCT 46.2 49.0 37.0* 29.6*  PLT 173  --  128* 107*   Coag's  Recent Labs Lab 04/09/13 0755  APTT 24  INR 1.27   BMET  Recent Labs Lab 04/09/13 0755 04/09/13 0800 04/10/13 0445 04/11/13 0500  NA 142 144 140 139  K 4.0 4.0 3.7 2.8*  CL 103 105 105 105  CO2 23  --  18* 21  BUN 19 21 30* 37*  CREATININE 1.17 1.40* 1.91* 1.81*  GLUCOSE 207* 203* 128* 103*   Electrolytes  Recent Labs Lab 04/09/13 0755 04/09/13 1000 04/10/13 0445 04/11/13 0500  CALCIUM 8.8  --  7.9* 7.6*  MG  --  1.6  --   --    Sepsis Markers  Recent Labs Lab 04/10/13 0914 04/11/13 0500  PROCALCITON 113.10 92.17    ABG  Recent Labs Lab 04/09/13 0929 04/09/13 1210  PHART 7.325*  7.369  PCO2ART 43.2 33.2*  PO2ART 101.0* 64.0*   Liver Enzymes  Recent Labs Lab 04/09/13 0755 04/10/13 0445  AST 23 17  ALT 14 12  ALKPHOS 66 60  BILITOT 0.6 0.5  ALBUMIN 3.1* 2.5*   Cardiac Enzymes  Recent Labs Lab 04/09/13 0755 04/09/13 1517  TROPONINI <0.30 0.63*   Glucose  Recent Labs Lab 04/10/13 1007 04/10/13 1158 04/10/13 2003 04/10/13 2332 04/11/13 0729  GLUCAP 88 85 103* 134* 109*    CXR:  11/23 >>> Hardware in good position, no change in bilateral airspace disease  ASSESSMENT / PLAN:  PULMONARY A: Acute respiratory failure in setting of pneumonia (CAP vs aspiration) - improving.  Possibly element of pulmonary edema in setting of VT. P:   Goal pH>7.30, SpO2>92 SBT 5/5, possible extubation if passed after femoral arterial sheath is  pulled  VAP bundle Trend ABG/CXR  CARDIOVASCULAR A: VT resolved with defibrillation. STEMI s/p cardiac cath without intervention. H/o CAD, HTN, AF. 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 P:  Goal MAP 60-65 Cardiology following  Amiodarone gtt ASA, Plavix, Lipitor, Metoprolol TTE  RENAL A: AKI / oliguria - improving.  Hypokalemia. P:   Trend BMP NS@KVO  K 40 x 2  GASTROINTESTINAL A:  Dysphagia s/p PEG. P:   TF per Nutritionist, hoild for possible extubation Protonix for GI Px  HEMATOLOGIC A:  Hemodilution.  Therapeutic anticoagulation for ACS.  NOT chronically anticoagulated for AF (reportedly). P:  Trend CBC Heparin gtt  INFECTIOUS A:  Pneumonia (aspiration vs CAP), PCT 92. P:   Abx / cx as above  ENDOCRINE A:  Hyperglycemia. P:   SSI  NEUROLOGIC A:  Encephalopathy. H/o multiple CVAs with significant residual deficit. P:   Goal RASS 0 to -1 Fentanyl PRN  I have personally obtained history, examined patient, evaluated and interpreted laboratory and imaging results, reviewed medical records, formulated assessment / plan and placed orders.  CRITICAL CARE:  The patient is critically ill with multiple organ systems failure and requires high complexity decision making for assessment and support, frequent evaluation and titration of therapies, application of advanced monitoring technologies and extensive interpretation of multiple databases. Critical Care Time devoted to patient care services described in this note is 35 minutes.   Lonia Farber, MD Pulmonary and Critical Care Medicine [facility] Pager: [phone]  04/11/2013, 9:09 AM

## 2013-04-11 NOTE — Progress Notes (Signed)
CARDIOLOGY PROGRESS NOTE  Subjective:  Pt hypotensive overnight and was given a 500cc bolus with BP persistently low but MAP staying above 65.   Objective:  Vital Signs in the last 24 hours: Temp:  [98.2 F (36.8 C)-100.4 F (38 C)] 99.5 F (37.5 C) (11/23 0700) Pulse Rate:  [66-79] 66 (11/23 0800) Resp:  [0-25] 25 (11/23 0800) BP: (74-99)/(56-68) 89/64 mmHg (11/23 0443) SpO2:  [96 %-100 %] 100 % (11/23 0800) Arterial Line BP: (68-104)/(53-73) 89/63 mmHg (11/23 0800) FiO2 (%):  [40 %] 40 % (11/23 0915) Weight:  [154 lb 12.2 oz (70.2 kg)] 154 lb 12.2 oz (70.2 kg) (11/23 0500)  Intake/Output from previous day: 11/22 0701 - 11/23 0700 In: 2589.6 [I.V.:1830.1; NG/GT:459.5; IV Piggyback:300] Out: 536 [Urine:535; Stool:1]  Physical Exam: General: Lying in bed, intubated.  HEENT: normal, ETT in place Neck: JVP appears normal, left deviated torticollis  Lungs: Intubated. CTA bilaterally anterioly, diminished airflow CV: RRR without murmur or gallop Abd: soft, NT, diminished BS, no hepatomegaly, PEG site with no surrounding edema/erythema Ext: no C/C/E, distal pulses intact and equal Skin: warm/dry no rash Neuro: Alert, nods to questions appropriately, follows commands, right hemiplegia   Lab Results:  Recent Labs  04/10/13 0445 04/11/13 0500  WBC 17.9* 14.9*  HGB 12.9* 10.3*  PLT 128* 107*    Recent Labs  04/10/13 0445 04/11/13 0500  NA 140 139  K 3.7 2.8*  CL 105 105  CO2 18* 21  GLUCOSE 128* 103*  BUN 30* 37*  CREATININE 1.91* 1.81*    Recent Labs  04/09/13 0755 04/09/13 1517  TROPONINI <0.30 0.63*    Cardiac Studies: 04/09/13 Left heart catheterization Final Conclusions:  1. Single vessel obstructive coronary disease. Patient does have a critical stenosis in a nondominant right coronary. The large dominant left coronary system is without significant disease.   Recommendations: I recommend medical management of his coronary disease. I do not  think that the right coronary lesion is the cause of his acute decompensation. Chest x-ray indicates a significant right lung pneumonia. It is possible that his ST changes may be related to pericarditis in association with his pneumonia. We will manage him with IV heparin and nitroglycerin. We'll continue aspirin and beta blocker therapy. We will add Plavix. Critical care medicine was consult for management of his pneumonia and respiratory failure. Patient is critically ill and prognosis is poor.    Tele: Sinus rhythm   Assessment/Plan:  72yo male with PMH of multiple CVAs and chronic aphasia presented with acute respiratory failure associated with atrial fibrillation a rapid ventricular response, was found to have PNA and diffuse ST elevation more pronounced in the inferior lateral leads on EKG, and in the ED the patient decompensated and required intubation and cardioversion x1 2/2 sustained VT, was started on an amiodarone drip and was transferred to the cath lab for [facility].   1. STEMI: Diffuse ST elevations on EKG, more prominent in the inferior leads. Troponin with mild elevation to 0.63. There was delay in bringing the patient to the cath lab because of his hemodynamic instability and needs for management of his respiratory failure in the emergency department. LHC with mid RCA 95% occlusion, nondominant vessel. No intervention was performed given that the vessel was nondominant, and the pt is to be managed medically on ASA, Plavix,  heparin drip. Nitro drip discontinued. 2D echo with no pericardial effusion, EF of 45-50%, possible akinesis of the distalanteroseptal and apical myocardium, grade 2 diastolic dysfunction,  and mild aortic regurgitation.     2. Atrial fibrillation with RVR/VT: Pt with h/o chronic afib. On ASA 325 at home as well as Atenolol 75mg  BID. Per his wife, pt rate controlled. Pt in afib on arrival to the ED. Pt with sustained VT requiring cardioversion and he was started on an  Amiodarone drip which he remains on this morning. No further episodes of Vtach. Afib resolved. Pt in sinus rhythm this morning.   3. Acute respiratory failure/CAP: Pt with acute respiratory failure requiring intubation. On CXR right multilobar airspace disease with involvement in the left base concerning for PNA were noted. Pt was started on Azithromycin and Rocephin for CAP coverage. Management per PCCM.  4. HTN: Pt on BB, ACEi, and diuretic at home. Holding on admission 2/2 hypotension. He has not required pressors and BP is stable with MAP >60, goal 60-65.   5. Hypokalemia - K of 2.8, repletion with Kdur 40 mEq x2.    Ky Barban, MD Internal Medicine Resident, PGY II [facility] Medicine [facility] 04/11/2013 9:27 AM   History and all data above reviewed.  Patient examined.  He is awake and alert and denies pain and SOB. I agree with the findings as above.  The patient exam reveals COR:RRR  ,  Lungs: Transmitted upper airway sounds  ,  Abd: Positive bowel sounds, no rebound no guarding, Ext No edema  .  All available labs, radiology testing, previous records reviewed. Agree with documented assessment and plan. OK to pull the sheath today.  I will restart the heparin per protocol when the sheath is out.  I will leave the amiodarone IV on today.  Probably discontinue in the AM. I would suggest oral amiodarone.  He is maintaining NSR.  Of note he was not on warfarin on admission.  He will now be on ASA and Plavix.  Probably discontinue the heparin in the AM.    Rollene Rotunda  10:40 AM  04/11/2013

## 2013-04-11 NOTE — Progress Notes (Signed)
Venous sheath pulled per MD order. Manual pressure applied for 10 minutes and site assessed for bleeding and hematoma. Site remains level 0 post pull. Pt tolerated procedure fairly well with hypertension noted. Primary nurse medicated pt with Fentanyl for pain once hypertension assessed. Report given to primary RN as she will continue to assess site.

## 2013-04-11 NOTE — Progress Notes (Addendum)
Heparin gtt stopped at 0600, 0930 ACT 155. Discussed with Dr Antoine Poche and arterial sheath pulled at 1135. Manual pressure held for 20 mins. Dressed with gauze and tegaderm. Pt became hypertensive while pressure being held, frequently attempted to swat at nurses, second RN in room provided comfort and diversion. Post pull site is level 0. No bruising, bleeding, hematoma noted. BLE equally warm, pink, dry, pulses 1+.

## 2013-04-11 NOTE — Progress Notes (Addendum)
ANTICOAGULATION CONSULT NOTE - Follow Up Consult  Pharmacy Consult for heparin Indication: CAD  Labs:  Recent Labs  04/09/13 0755 04/09/13 0800  04/09/13 1517  04/10/13 0445 04/10/13 1400 04/10/13 2300 04/11/13 0500 04/11/13 0751  HGB 15.0 16.7  --   --   --  12.9*  --   --  10.3*  --   HCT 46.2 49.0  --   --   --  37.0*  --   --  29.6*  --   PLT 173  --   --   --   --  128*  --   --  107*  --   APTT 24  --   --   --   --   --   --   --   --   --   LABPROT 15.6*  --   --   --   --   --   --   --   --   --   INR 1.27  --   --   --   --   --   --   --   --   --   HEPARINUNFRC  --   --   < >  --   < > 0.26* 0.14* 0.23*  --  <0.10*  CREATININE 1.17 1.40*  --   --   --  1.91*  --   --  1.81*  --   CKTOTAL 57  --   --   --   --   --   --   --   --   --   CKMB 2.4  --   --   --   --   --   --   --   --   --   TROPONINI <0.30  --   --  0.63*  --   --   --   --   --   --   < > = values in this interval not displayed.  Assessment: 72yo male now subtherapeutic on heparin after one level at goal after resumed post-cath.  He still has arterial sheath in place and discussion is ongoing about the timing for removal.  His heparin has been turned off and thus explains the low heparin level.  I spoke with his nurse who reports no bleeding complications.  She will call pharmacy to let us know if Heparin to be resumed.  Goal of Therapy:  Heparin level 0.3-0.7 units/ml Monitor platelets per protocol: Yes   Plan:  F/U plans for continuing IV heparin  Nadara Mustard, PharmD., MS Clinical Pharmacist Pager:  [phone] Thank you for allowing pharmacy to be part of this patients care team.  04/11/2013 8:51 AM    ADDENDUM:  Sheath pulled this morning (11AM) - plans to resume heparin this evening.  Will restart at 7PM at IV heparin rate of 1500 units/hr.  Will check a heparin level 8 hours after restart and adjust to goal range of 0.3-0.7.  Will monitor for bleeding complications.  Nadara Mustard, PharmD., MS Clinical Pharmacist Pager:  [phone] Thank you for allowing pharmacy to be part of this patients care team.

## 2013-04-12 ENCOUNTER — Encounter (HOSPITAL_COMMUNITY): Payer: Self-pay | Admitting: *Deleted

## 2013-04-12 ENCOUNTER — Inpatient Hospital Stay (HOSPITAL_COMMUNITY): Payer: Medicare Other

## 2013-04-12 DIAGNOSIS — I472 Ventricular tachycardia: Secondary | ICD-10-CM

## 2013-04-12 LAB — BASIC METABOLIC PANEL
BUN: 27 mg/dL — ABNORMAL HIGH (ref 6–23)
CO2: 19 mEq/L (ref 19–32)
Chloride: 103 mEq/L (ref 96–112)
GFR calc Af Amer: 62 mL/min — ABNORMAL LOW (ref >90)
GFR calc non Af Amer: 54 mL/min — ABNORMAL LOW (ref >90)
Potassium: 2.9 mEq/L — ABNORMAL LOW (ref 3.5–5.1)

## 2013-04-12 LAB — CBC
HCT: 30.2 % — ABNORMAL LOW (ref 39.0–52.0)
Hemoglobin: 10.4 g/dL — ABNORMAL LOW (ref 13.0–17.0)
MCV: 92.9 fL (ref 78.0–100.0)
Platelets: 85 10*3/uL — ABNORMAL LOW (ref 150–400)
RBC: 3.25 MIL/uL — ABNORMAL LOW (ref 4.22–5.81)
RDW: 15.1 % (ref 11.5–15.5)
WBC: 11.5 10*3/uL — ABNORMAL HIGH (ref 4.0–10.5)

## 2013-04-12 LAB — GLUCOSE, CAPILLARY
Glucose-Capillary: 103 mg/dL — ABNORMAL HIGH (ref 70–99)
Glucose-Capillary: 125 mg/dL — ABNORMAL HIGH (ref 70–99)

## 2013-04-12 LAB — CULTURE, RESPIRATORY W GRAM STAIN: Gram Stain: NONE SEEN

## 2013-04-12 LAB — PROCALCITONIN: Procalcitonin: 39.22 ng/mL

## 2013-04-12 LAB — HEPARIN LEVEL (UNFRACTIONATED): Heparin Unfractionated: 0.68 IU/mL (ref 0.30–0.70)

## 2013-04-12 MED ORDER — PANTOPRAZOLE SODIUM 40 MG PO PACK
40.0000 mg | PACK | Freq: Every day | ORAL | Status: DC
  Administered 2013-04-13 – 2013-04-30 (×18): 40 mg
  Filled 2013-04-12 (×18): qty 20

## 2013-04-12 MED ORDER — POTASSIUM CHLORIDE 20 MEQ/15ML (10%) PO LIQD
60.0000 meq | Freq: Two times a day (BID) | ORAL | Status: DC
  Administered 2013-04-12 – 2013-04-14 (×5): 60 meq
  Filled 2013-04-12 (×8): qty 45

## 2013-04-12 MED ORDER — HYDRALAZINE HCL 20 MG/ML IJ SOLN
10.0000 mg | Freq: Four times a day (QID) | INTRAMUSCULAR | Status: DC | PRN
  Administered 2013-04-12 – 2013-04-13 (×2): 10 mg via INTRAVENOUS
  Administered 2013-04-14 – 2013-04-15 (×2): 20 mg via INTRAVENOUS
  Administered 2013-04-16: 10 mg via INTRAVENOUS
  Administered 2013-04-17 – 2013-04-18 (×2): 20 mg via INTRAVENOUS
  Administered 2013-04-19 – 2013-04-21 (×4): 10 mg via INTRAVENOUS
  Filled 2013-04-12 (×11): qty 1

## 2013-04-12 MED ORDER — HEPARIN (PORCINE) IN NACL 100-0.45 UNIT/ML-% IJ SOLN
INTRAMUSCULAR | Status: AC
  Administered 2013-04-12: 1600 [IU]/h via INTRAVENOUS
  Filled 2013-04-12: qty 250

## 2013-04-12 MED ORDER — AMIODARONE HCL IN DEXTROSE 360-4.14 MG/200ML-% IV SOLN
INTRAVENOUS | Status: AC
  Administered 2013-04-12: 30 mg/h via INTRAVENOUS
  Filled 2013-04-12: qty 200

## 2013-04-12 MED ORDER — AMIODARONE HCL 200 MG PO TABS
400.0000 mg | ORAL_TABLET | Freq: Every day | ORAL | Status: DC
  Administered 2013-04-12 – 2013-04-30 (×19): 400 mg
  Filled 2013-04-12 (×19): qty 2

## 2013-04-12 MED ORDER — HEPARIN (PORCINE) IN NACL 100-0.45 UNIT/ML-% IJ SOLN
2000.0000 [IU]/h | INTRAMUSCULAR | Status: DC
  Administered 2013-04-12 – 2013-04-15 (×5): 1600 [IU]/h via INTRAVENOUS
  Administered 2013-04-15: 1700 [IU]/h via INTRAVENOUS
  Administered 2013-04-16: 1850 [IU]/h via INTRAVENOUS
  Administered 2013-04-17 – 2013-04-18 (×2): 2000 [IU]/h via INTRAVENOUS
  Filled 2013-04-12 (×21): qty 250

## 2013-04-12 MED ORDER — POTASSIUM CHLORIDE 20 MEQ/15ML (10%) PO LIQD
40.0000 meq | ORAL | Status: AC
  Administered 2013-04-12 (×3): 40 meq
  Filled 2013-04-12 (×3): qty 30

## 2013-04-12 NOTE — Progress Notes (Signed)
NUTRITION FOLLOW UP  Intervention:   1.  Enteral nutrition; continue TF of Jevity 1.2 via OGT @  61ml/hr which will provide 1872 calories, 86g protein, free water and meet 101% estimated calorie needs, 102% estimated protein needs.  Pt has reached goal.  Nutrition Dx:   Inadequate oral intake related to inability to eat, Ongoing  Goal:   1. Enteral nutrition; initiation with tolerance. Pt to meet >/=90% estimated needs with nutrition support - not met, but in process of starting TF 2. Wt/wt change; monitor trends - significant weight drop documented   Monitor:   Weights, labs, vent status, TF initiation/tolerance/advancement  Assessment:   Pt admitted with NSTEMI. 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 He has some ability to communication per wife, however is not verbal.  Home regimen is: 1 can Osmolite 1.2 + 1 can Jevity 1.2 in the mornings for a total of 2 cans daily. In addition, pt consumes meals and snacks throughout the day. Wife prepares foods soft and bland, and mashes them into a soft bites. Pt can chew and swallow well at baseline.   Patient is currently intubated on ventilator support.  MV: 8.4 L/min Temp:Temp (24hrs), Avg:99.7 F (37.6 C), Min:98.5 F (36.9 C), Max:101.5 F (38.6 C)  Discussed with RN.  Pt did not tolerate wean well today.  To remain intubated.  Residuals:  0 mL  Height: Ht Readings from Last 1 Encounters:  04/09/13 5\' 6"  (1.676 m)    Weight Status:   Wt Readings from Last 1 Encounters:  04/12/13 166 lb 7.2 oz (75.5 kg)    Re-estimated needs:  Kcal: 1847 Protein: 84-98g Fluid: 1.8 L/day  Skin: intact  Diet Order: NPO   Intake/Output Summary (Last 24 hours) at 04/12/13 1211 Last data filed at 04/12/13 1100  Gross per 24 hour  Intake 2303.79 ml  Output   1158 ml  Net 1145.79 ml    Last BM: 11/24  Labs:   Recent Labs Lab 04/09/13 0800 04/09/13 1000 04/10/13 0445 04/11/13 0500 04/12/13 0250   NA 144  --  140 139 137  K 4.0  --  3.7 2.8* 2.9*  CL 105  --  105 105 103  CO2  --   --  18* 21 19  BUN 21  --  30* 37* 27*  CREATININE 1.40*  --  1.91* 1.81* 1.30  CALCIUM  --   --  7.9* 7.6* 7.7*  MG  --  1.6  --   --   --   GLUCOSE 203*  --  128* 103* 201*    CBG (last 3)   Recent Labs  04/12/13 0019 04/12/13 0347 04/12/13 0814  GLUCAP 143* 99 103*    Scheduled Meds: . amiodarone  400 mg Per Tube Daily  . antiseptic oral rinse  15 mL Mouth Rinse QID  . aspirin  81 mg Per J Tube Daily  . atorvastatin  40 mg Per Tube q1800  . azithromycin  500 mg Intravenous Q24H  . cefTRIAXone (ROCEPHIN)  IV  2 g Intravenous Q24H  . chlorhexidine  15 mL Mouth Rinse BID  . Chlorhexidine Gluconate Cloth  6 each Topical Q0600  . clopidogrel  75 mg Per Tube Q breakfast  . insulin aspart  0-15 Units Subcutaneous Q4H  . metoprolol tartrate  25 mg Per Tube Q6H  . mupirocin ointment  1 application Nasal BID  . oxybutynin  5 mg Per Tube Daily  . [  START ON 04/13/2013] pantoprazole sodium  40 mg Per Tube Daily  . potassium chloride  60 mEq Per Tube BID    Continuous Infusions: . sodium chloride 10 mL/hr at 04/12/13 1100  . sodium chloride 10 mL/hr at 04/12/13 1100  . sodium chloride Stopped (04/11/13 1200)  . feeding supplement (JEVITY 1.2 CAL) 1,000 mL (04/12/13 1100)  . heparin 1,600 Units/hr (04/12/13 1100)    Loyce Dys, MS RD LDN Clinical Inpatient Dietitian Pager: [phone] Weekend/After hours pager: [phone]

## 2013-04-12 NOTE — Progress Notes (Addendum)
CARDIOLOGY PROGRESS NOTE  Subjective:  BP improved over the past 24hrs with reported hypertension early this morning, improved currently. Arterial sheath removed from right groin w/o complication.  Objective:  Vital Signs in the last 24 hours: Temp:  [98.5 F (36.9 C)-101.5 F (38.6 C)] 99.1 F (37.3 C) (11/24 0400) Pulse Rate:  [64-78] 71 (11/24 0600) Resp:  [0-37] 25 (11/24 0600) BP: (109-162)/(65-105) 162/86 mmHg (11/24 0600) SpO2:  [82 %-100 %] 100 % (11/24 0600) Arterial Line BP: (89-115)/(63-81) 109/81 mmHg (11/23 1235) FiO2 (%):  [40 %] 40 % (11/24 0400) Weight:  [166 lb 7.2 oz (75.5 kg)] 166 lb 7.2 oz (75.5 kg) (11/24 0500)  Intake/Output from previous day: 11/23 0701 - 11/24 0700 In: 2203.8 [I.V.:1031.4; NG/GT:772.4; IV Piggyback:300] Out: 998 [Urine:995; Stool:3]  Physical Exam: General: Lying in bed, intubated.  HEENT: normal, ETT in place Neck: JVP appears normal, left deviated torticollis  Lungs: Intubated. CTA bilaterally anterioly, diminished airflow CV: RRR without murmur or gallop Abd: soft, NT, diminished BS, no hepatomegaly, PEG site with no surrounding edema/erythema Ext: BLE with vascular changes.  Skin: warm/dry no rash. Feet with foam bandages. Neuro: Alert, nods to questions appropriately, follows commands, right hemiplegia   Lab Results:  Recent Labs  04/11/13 0500 04/12/13 0250  WBC 14.9* 11.5*  HGB 10.3* 10.4*  PLT 107* 85*    Recent Labs  04/11/13 0500 04/12/13 0250  NA 139 137  K 2.8* 2.9*  CL 105 103  CO2 21 19  GLUCOSE 103* 201*  BUN 37* 27*  CREATININE 1.81* 1.30    Recent Labs  04/09/13 0755 04/09/13 1517  TROPONINI <0.30 0.63*    Cardiac Studies: 04/09/13 Left heart catheterization Final Conclusions:  1. Single vessel obstructive coronary disease. Patient does have a critical stenosis in a nondominant right coronary. The large dominant left coronary system is without significant disease.    Recommendations: I recommend medical management of his coronary disease. I do not think that the right coronary lesion is the cause of his acute decompensation. Chest x-ray indicates a significant right lung pneumonia. It is possible that his ST changes may be related to pericarditis in association with his pneumonia. We will manage him with IV heparin and nitroglycerin. We'll continue aspirin and beta blocker therapy. We will add Plavix. Critical care medicine was consult for management of his pneumonia and respiratory failure. Patient is critically ill and prognosis is poor.    Tele: Sinus rhythm, rate 71. Tele reviewed.   Assessment/Plan:  72yo male with PMH of multiple CVAs and chronic aphasia presented with acute respiratory failure associated with atrial fibrillation a rapid ventricular response, was found to have PNA and diffuse ST elevation more pronounced in the inferior lateral leads on EKG, and in the ED the patient decompensated and required intubation and cardioversion x1 2/2 sustained VT, was started on an amiodarone drip and was transferred to the cath lab for [facility].   1. STEMI: Diffuse ST elevations on EKG, more prominent in the inferior leads. Troponin with mild elevation to 0.63. There was delay in bringing the patient to the cath lab because of his hemodynamic instability and needs for management of his respiratory failure in the emergency department. LHC with mid RCA 95% occlusion, nondominant vessel. No intervention was performed given that the vessel was nondominant, and the pt is to be managed medically on ASA, Plavix, heparin drip. Nitro drip discontinued. 2D echo with no pericardial effusion, EF of 45-50%, possible akinesis of the  distalanteroseptal and apical myocardium, grade 2 diastolic dysfunction, and mild aortic regurgitation. Continuing statin, BB. Holding ACEi for now given his renal function and recent hypotension. Continuing the heparin drip for now while we considering  the need for long term anticoagulation given his h/o afib. Continuing the ASA and Plavix.     2. Atrial fibrillation with RVR/VT: Pt with h/o chronic afib???? (The history documented is that the wife says that he has had irregular heartbeats. I do not know if there is documentation of atrial fib in the past....Marland KitchenMarland KitchenMyrtis Ser). On ASA 325 at home as well as Atenolol 75mg  BID. Per his wife, pt rate controlled. Pt in afib on arrival to the ED. Pt with sustained VT requiring cardioversion and he was started on an Amiodarone drip which he remains on this morning. Afib resolved. Pt remains in sinus rhythm this morning. Will d/c the amio drip and begin oral amiodarone 400mg  BID today. Still considering the long term need for an antiarrhythmic and for anticoagulation for this patient.   3. Acute respiratory failure/CAP: Pt with acute respiratory failure requiring intubation. On CXR right multilobar airspace disease with involvement in the left base concerning for PNA were noted. Pt was started on Azithromycin and Rocephin for CAP coverage, day #4. Management per PCCM.  4. HTN: Pt on BB, ACEi, and diuretic at home. Held on admission 2/2 hypotension. He has not required pressors and BP is stable with MAP >60, goal 60-65. BP greatly improved over the last 24hrs and    5. Hypokalemia:  K of 2.9, repletion with Kdur 40 mEq x2.   6. AKI: Pt with Cr of 1.21 on admission, which trended to 1.91. 1.3 today. Unsure of baseline renal function.    Genelle Gather, MD Internal Medicine Resident, PGY II [facility] Internal Medicine Program 04/12/2013 7:09 AM  Patient seen and examined. I agree with the assessment and plan as detailed above. See also my additional thoughts below.   on admission cardiac catheterization revealed a tight nondominant right coronary artery. Decision was made to not intervene. With this scenario, decision is made to use aspirin and Plavix. The issue arises as to how to approach his atrial  arrhythmia. The admission note says that the wife says that he has had irregular heartbeats in the past. I do not know if he is ever had documented atrial fibrillation. I also do not know where his other extensive medical care has been provided since there is not significant data in this medical record. There is no old EKG for comparison. At this point we know that he presented with some atrial fibrillation with this acute event. He has not been on anticoagulant in the past. I do not know who has made this decision. It may be that he had no absolute indication for anticoagulation in the past. With his current overall status however , anticoagulation would be appropriate . It is my understanding that the patient's wife has been hesitant up to this point , because she says there has never been mention of anticoagulation in the past. There will have to be ongoing discussion about whether an anticoagulant is to be added to his dual antiplatelet therapy. In addition, the patient has had multiple CVAs in the past. Certainly this could have been related to paroxysmal atrial fibrillation, but we do not know what type of evaluation has been done in the past. Regardless of the decision concerning anticoagulation, it appears to be prudent to switch him from IV amiodarone to  oral amiodarone for now.  Willa Rough, MD, [facility] 04/12/2013 10:12 AM

## 2013-04-12 NOTE — Consult Note (Signed)
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  Make sure appropriate secure to the patient's leg with leg strap to avoid further injury.  Urology will need to be consulted to address long term management or repair if desired.  No topical care will address issue. Jaquetta Currier [employer] RN,CWOCN [phone]

## 2013-04-12 NOTE — Progress Notes (Signed)
ANTICOAGULATION CONSULT NOTE - Follow Up Consult  Pharmacy Consult for heparin Indication: CAD  Labs:  Recent Labs  04/09/13 0755  04/09/13 1517  04/10/13 0445  04/10/13 2300 04/11/13 0500 04/11/13 0751 04/12/13 0250  HGB 15.0  < >  --   --  12.9*  --   --  10.3*  --  10.4*  HCT 46.2  < >  --   --  37.0*  --   --  29.6*  --  30.2*  PLT 173  --   --   --  128*  --   --  107*  --  85*  APTT 24  --   --   --   --   --   --   --   --   --   LABPROT 15.6*  --   --   --   --   --   --   --   --   --   INR 1.27  --   --   --   --   --   --   --   --   --   HEPARINUNFRC  --   < >  --   < > 0.26*  < > 0.23*  --  <0.10* 0.25*  CREATININE 1.17  < >  --   --  1.91*  --   --  1.81*  --  1.30  CKTOTAL 57  --   --   --   --   --   --   --   --   --   CKMB 2.4  --   --   --   --   --   --   --   --   --   TROPONINI <0.30  --  0.63*  --   --   --   --   --   --   --   < > = values in this interval not displayed.  Assessment: 72yo male subtherapeutic on heparin after resumed s/p sheath removal.  Goal of Therapy:  Heparin level 0.3-0.7 units/ml   Plan:  Will increase heparin gtt by 1 unit/kg/hr to 1600 units/hr and check level in 8hr.  Vernard Gambles, PharmD, BCPS  04/12/2013,5:15 AM

## 2013-04-12 NOTE — Progress Notes (Signed)
ANTICOAGULATION CONSULT NOTE - Follow Up Consult  Pharmacy Consult for heparin Indication: CAD  Labs:  Recent Labs  04/10/13 0445  04/11/13 0500 04/11/13 0751 04/12/13 0250 04/12/13 1521  HGB 12.9*  --  10.3*  --  10.4*  --   HCT 37.0*  --  29.6*  --  30.2*  --   PLT 128*  --  107*  --  85*  --   HEPARINUNFRC 0.26*  < >  --  <0.10* 0.25* 0.68  CREATININE 1.91*  --  1.81*  --  1.30  --   < > = values in this interval not displayed.  Assessment: 72yo male on heparin drip for ACS/Afib.  Heparin drip resumed post-cath.  Heparin drip 1600 uts/hr HL 0.68 at goal.  Goal of Therapy:  Heparin level 0.3-0.7 units/ml Monitor platelets per protocol: Yes   Plan:  Continue Heparin drip 1600 uts/hr Daily CBC, heparin level  Leota Sauers Pharm.D. CPP, BCPS Clinical Pharmacist [phone] 04/12/2013 5:04 PM

## 2013-04-12 NOTE — Consult Note (Signed)
  I was called in consultation by Dr. Peter [place] to see [name] [name].  He is a 72 yo BM who is currently on the vent in CCU following an aspiration event.  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   I was asked to see him because of catheter erosion.  His wife was with him today and reports that he had a chronic foley for many years and developed the erosion during that period.   The catheter came out one day and couldn't be replaced by the nurse so he has been voiding spontaneously into diapers for some time.  PE: 72 yo BM on the vent and non-responsive.         GU: Uncircumcised phallus with acquired hypospadius to the mid penile shaft which appears chronic and non-infected.   There is a foley indwelling.  The scrotum is unremarkable.   Imp:  Chronic acquired hypospadius.  Rec:  Maintain the foley with a tube holder to avoid further pressure necrosis.  If he is extubated in the future, he could be given a trial without catheter.  Reconsult prn.   Bjorn Pippin

## 2013-04-12 NOTE — Progress Notes (Signed)
Orthopedic Tech Progress Note Patient Details:  [name] [name] November 30, 1940 [id number]  Patient ID: [name], male   DOB: [birth year], 72 y.o.   MRN: [id number]   Shawnie Pons 04/12/2013, 11:44 AMCalled bio-tech for bilateral ankle foot orthosis.

## 2013-04-12 NOTE — Progress Notes (Signed)
PULMONARY  / CRITICAL CARE MEDICINE  Name: [name] [name] MRN: [id number] DOB: 08-10-1940    ADMISSION DATE:  04/09/2013 CONSULTATION DATE:  04/09/2013  REFERRING MD :  Cardiology PRIMARY SERVICE: PCCM  CHIEF COMPLAINT:  Acute respiratory failure  BRIEF PATIENT DESCRIPTION: 72 yo with history of multiple CVA (aphasia, PEG) admitted with respiratory failure in setting of pneumonia and STEMI.  SIGNIFICANT EVENTS / STUDIES:  11/21  Admitted with respiratory failure in setting of pneumonia and STEMI 11/21  Cardiac cath >>> 95% occluded RCA, no intervention due to non dominant nature 11/22  TTE >>> EF 45-50%, abnormal relaxation, mild aortic regurgitation  LINES / TUBES: Foley 11/21 >>> PEG ??? >>> R fem CVL 11/21 >>> R fem A-Line 11/21 >>>  CULTURES: 11/21 Blood >>> Neg 11/22 Sputum >>> WBCs, no organisms  ANTIBIOTICS: Azithromycin 11/21 >>> Ceftriaxone 11/21 >>>  SUBJECTIVE:  Failed wean this morning as BPs went up to 180s and RR went to the 40s.  Nursing reports 12 beat run of Vtach this am  VITAL SIGNS: Temp:  [98.5 F (36.9 C)-101.5 F (38.6 C)] 99.7 F (37.6 C) (11/24 0800) Pulse Rate:  [64-78] 70 (11/24 0910) Resp:  [0-37] 25 (11/24 0910) BP: (109-177)/(65-120) 155/86 mmHg (11/24 0910) SpO2:  [82 %-100 %] 100 % (11/24 0910) Arterial Line BP: (106-114)/(77-81) 109/81 mmHg (11/23 1235) FiO2 (%):  [40 %] 40 % (11/24 0854) Weight:  [166 lb 7.2 oz (75.5 kg)] 166 lb 7.2 oz (75.5 kg) (11/24 0500)  HEMODYNAMICS:   VENTILATOR SETTINGS: Vent Mode:  [-] PRVC FiO2 (%):  [40 %] 40 % Set Rate:  [25 bmp] 25 bmp Vt Set:  [500 mL] 500 mL PEEP:  [5 cmH20] 5 cmH20 Pressure Support:  [5 cmH20-10 cmH20] 5 cmH20 Plateau Pressure:  [18 cmH20-19 cmH20] 19 cmH20  INTAKE / OUTPUT: Intake/Output     11/23 0701 - 11/24 0700 11/24 0701 - 11/25 0700   I.V. (mL/kg) 1084.1 (14.4) 158.1 (2.1)   Other 100 60   NG/GT 837.4 195   IV Piggyback 300    Total Intake(mL/kg) 2321.5 (30.7)  413.1 (5.5)   Urine (mL/kg/hr) 995 (0.5) 235 (0.9)   Stool 3 (0)    Total Output 998 235   Net +1323.5 +178.1        Stool Occurrence 1 x      PHYSICAL EXAMINATION: General:  Awake and intubated, NAD Neuro:  Awake, alert, nonverbal due to intubation, follows commands HEENT:  OETT, MM moist and pink Cardiovascular:  Irregular, no murmurs Lungs:  Bilateral air entry, rhonchi R>L Abdomen:  Soft, PEG site intact, bowel sounds present Musculoskeletal:  Torticollis, contractures, bilateral foot drop Skin:  Penile erosion, skin thin and shiny  LABS:  CBC  Recent Labs Lab 04/10/13 0445 04/11/13 0500 04/12/13 0250  WBC 17.9* 14.9* 11.5*  HGB 12.9* 10.3* 10.4*  HCT 37.0* 29.6* 30.2*  PLT 128* 107* 85*   Coag's  Recent Labs Lab 04/09/13 0755  APTT 24  INR 1.27   BMET  Recent Labs Lab 04/10/13 0445 04/11/13 0500 04/12/13 0250  NA 140 139 137  K 3.7 2.8* 2.9*  CL 105 105 103  CO2 18* 21 19  BUN 30* 37* 27*  CREATININE 1.91* 1.81* 1.30  GLUCOSE 128* 103* 201*   Electrolytes  Recent Labs Lab 04/09/13 0755 04/09/13 1000 04/10/13 0445 04/11/13 0500 04/12/13 0250  CALCIUM 8.8  --  7.9* 7.6* 7.7*  MG  --  1.6  --   --   --  Sepsis Markers  Recent Labs Lab 04/10/13 0914 04/11/13 0500 04/12/13 0250  PROCALCITON 113.10 92.17 39.22    ABG  Recent Labs Lab 04/09/13 0929 04/09/13 1210  PHART 7.325* 7.369  PCO2ART 43.2 33.2*  PO2ART 101.0* 64.0*   Liver Enzymes  Recent Labs Lab 04/09/13 0755 04/10/13 0445  AST 23 17  ALT 14 12  ALKPHOS 66 60  BILITOT 0.6 0.5  ALBUMIN 3.1* 2.5*   Cardiac Enzymes  Recent Labs Lab 04/09/13 0755 04/09/13 1517  TROPONINI <0.30 0.63*   Glucose  Recent Labs Lab 04/11/13 1134 04/11/13 1604 04/11/13 1953 04/12/13 0019 04/12/13 0347 04/12/13 0814  GLUCAP 108* 88 106* 143* 99 103*    CXR:  11/23 >>> Hardware in good position, more prominent bilateral scattered airspace disease.  Infiltrate vs.  Atelectasis??  ASSESSMENT / PLAN:  PULMONARY A:  Acute respiratory failure in setting of pneumonia (CAP vs aspiration) - improving.   Possibly element of pulmonary edema in setting of VT. P:   - Goal pH>7.30, SpO2>92 - Cont to wean as tolerated  - VAP bundle -Trend CXR  CARDIOVASCULAR A:  VT resolved with defibrillation.  STEMI s/p cardiac cath without intervention.  H/o CAD, HTN, AF. 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 P:  - Goal MAP 60-65 - Cardiology following  - Amiodarone gtt - Heparin gtt - ASA, Plavix, Lipitor, Metoprolol  RENAL A:  AKI / oliguria - improving   Hypokalemia. P:   - Trend BMP - NS@KVO  - K liq per Gtube 60 mEq BID - Consult to urology for penile erosion  GASTROINTESTINAL A:  Dysphagia s/p PEG. P:   - TF per Nutritionist -  hold for possible extubation, if no extubation restart - Protonix for GI Px  HEMATOLOGIC A:   Hemodilution.   Therapeutic anticoagulation for ACS- NOT chronically anticoagulated for AF (reportedly). P:  - Trend CBC - Heparin gtt  INFECTIOUS A:  Pneumonia (aspiration vs CAP), PCT 92. P:   - Abx / cx as above - Monitor fever curve and WBCs  ENDOCRINE A:  Hyperglycemia. P:   - SSI  NEUROLOGIC A:   Encephalopathy.  H/o multiple CVAs with significant residual deficit. P:   - Goal RASS 0 to -1 - Fentanyl PRN - Foot drop boots  Toni Amend [facility] Physician Assistant Student 04/12/13, 10:49 AM  Today's summary:  Will attempt to wean patient again today.  Suspect htn likely due to turning sedation off.  Will extubate if possible.  If no extubation today restart tube feedings and hold again at midnight.  Will continue abx as ordered. Will consult urology for penile erosion as per wound care.    Levy Pupa, MD, PhD 04/13/2013, 3:58 PM [place] [facility] [phone] or if no answer [phone]

## 2013-04-12 NOTE — Progress Notes (Signed)
eLink Physician-Brief Progress Note Patient Name: [name] [name] DOB: 1940/10/25 MRN: [id number]  Date of Service  04/12/2013   HPI/Events of Note  Hypokalemia   eICU Interventions  Potassium replaced   Intervention Category Intermediate Interventions: Electrolyte abnormality - evaluation and management  DETERDING,ELIZABETH 04/12/2013, 4:34 AM

## 2013-04-13 ENCOUNTER — Inpatient Hospital Stay (HOSPITAL_COMMUNITY): Payer: Medicare Other

## 2013-04-13 LAB — CBC
Hemoglobin: 9.6 g/dL — ABNORMAL LOW (ref 13.0–17.0)
Platelets: 90 10*3/uL — ABNORMAL LOW (ref 150–400)
RBC: 3.11 MIL/uL — ABNORMAL LOW (ref 4.22–5.81)
WBC: 13 10*3/uL — ABNORMAL HIGH (ref 4.0–10.5)

## 2013-04-13 LAB — GLUCOSE, CAPILLARY
Glucose-Capillary: 101 mg/dL — ABNORMAL HIGH (ref 70–99)
Glucose-Capillary: 118 mg/dL — ABNORMAL HIGH (ref 70–99)
Glucose-Capillary: 130 mg/dL — ABNORMAL HIGH (ref 70–99)
Glucose-Capillary: 113 mg/dL — ABNORMAL HIGH (ref 70–99)
Glucose-Capillary: 98 mg/dL (ref 70–99)

## 2013-04-13 LAB — BASIC METABOLIC PANEL
CO2: 22 mEq/L (ref 19–32)
Chloride: 111 mEq/L (ref 96–112)
Glucose, Bld: 147 mg/dL — ABNORMAL HIGH (ref 70–99)
Sodium: 142 mEq/L (ref 135–145)

## 2013-04-13 LAB — HEPARIN LEVEL (UNFRACTIONATED): Heparin Unfractionated: 0.56 IU/mL (ref 0.30–0.70)

## 2013-04-13 MED ORDER — WARFARIN - PHARMACIST DOSING INPATIENT: Freq: Every day | Status: DC

## 2013-04-13 MED ORDER — LISINOPRIL 5 MG PO TABS
5.0000 mg | ORAL_TABLET | Freq: Every day | ORAL | Status: DC
  Administered 2013-04-13: 5 mg via ORAL
  Filled 2013-04-13 (×2): qty 1

## 2013-04-13 MED ORDER — WARFARIN SODIUM 5 MG PO TABS
5.0000 mg | ORAL_TABLET | Freq: Once | ORAL | Status: AC
  Administered 2013-04-14: 5 mg via ORAL
  Filled 2013-04-13: qty 1

## 2013-04-13 MED ORDER — FUROSEMIDE 10 MG/ML IJ SOLN
20.0000 mg | Freq: Once | INTRAMUSCULAR | Status: AC
  Administered 2013-04-13: 20 mg via INTRAVENOUS

## 2013-04-13 MED ORDER — FUROSEMIDE 10 MG/ML IJ SOLN
INTRAMUSCULAR | Status: AC
  Filled 2013-04-13: qty 4

## 2013-04-13 MED FILL — Heparin Sodium (Porcine) Inj 1000 Unit/ML: INTRAMUSCULAR | Qty: 30 | Status: AC

## 2013-04-13 MED FILL — Lidocaine HCl Local Preservative Free (PF) Inj 1%: INTRAMUSCULAR | Qty: 30 | Status: AC

## 2013-04-13 MED FILL — Verapamil HCl IV Soln 2.5 MG/ML: INTRAVENOUS | Qty: 2 | Status: AC

## 2013-04-13 MED FILL — Heparin Sodium (Porcine) 2 Unit/ML in Sodium Chloride 0.9%: INTRAMUSCULAR | Qty: 500 | Status: AC

## 2013-04-13 MED FILL — Nitroglycerin IV Soln 200 MCG/ML in D5W: INTRAVENOUS | Qty: 1 | Status: AC

## 2013-04-13 NOTE — Progress Notes (Signed)
PULMONARY  / CRITICAL CARE MEDICINE  Name: [name] [name] MRN: [id number] DOB: December 04, 1940    ADMISSION DATE:  04/09/2013 CONSULTATION DATE:  04/09/2013  REFERRING MD :  Cardiology PRIMARY SERVICE: PCCM  CHIEF COMPLAINT:  Acute respiratory failure  BRIEF PATIENT DESCRIPTION: 72 yo with history of multiple CVA (aphasia, PEG) admitted with respiratory failure in setting of pneumonia and STEMI.  SIGNIFICANT EVENTS / STUDIES:  11/21  Admitted with respiratory failure in setting of pneumonia and STEMI 11/21  Cardiac cath >>> 95% occluded RCA, no intervention due to non dominant nature 11/22  TTE >>> EF 45-50%, abnormal relaxation, mild aortic regurgitation  LINES / TUBES: Foley 11/21 >>> PEG ??? >>> R fem CVL 11/21 >>> R fem A-Line 11/21 >>>  CULTURES: 11/21 Blood >>> Neg 11/22 Sputum >>> WBCs, no organisms  ANTIBIOTICS: Azithromycin 11/21 >>> Ceftriaxone 11/21 >>>  SUBJECTIVE:   Tolerated PSV for a short time but then rapid shallow breathing.   VITAL SIGNS: Temp:  [97.7 F (36.5 C)-99.8 F (37.7 C)] 98.9 F (37.2 C) (11/25 1600) Pulse Rate:  [61-92] 70 (11/25 1515) Resp:  [14-36] 16 (11/25 1515) BP: (116-208)/(66-125) 130/79 mmHg (11/25 1515) SpO2:  [97 %-100 %] 100 % (11/25 1515) FiO2 (%):  [40 %] 40 % (11/25 1523) Weight:  [71.5 kg (157 lb 10.1 oz)] 71.5 kg (157 lb 10.1 oz) (11/25 0500)  HEMODYNAMICS:   VENTILATOR SETTINGS: Vent Mode:  [-] PRVC FiO2 (%):  [40 %] 40 % Set Rate:  [14 bmp] 14 bmp Vt Set:  [500 mL] 500 mL PEEP:  [5 cmH20] 5 cmH20 Plateau Pressure:  [17 cmH20-21 cmH20] 19 cmH20  INTAKE / OUTPUT: Intake/Output     11/24 0701 - 11/25 0700 11/25 0701 - 11/26 0700   I.V. (mL/kg) 803.2 (11.2) 234 (3.3)   Other 250 270   NG/GT 1625 585   IV Piggyback 300 300   Total Intake(mL/kg) 2978.2 (41.7) 1389 (19.4)   Urine (mL/kg/hr) 1200 (0.7) 2375 (3.6)   Stool     Total Output 1200 2375   Net +1778.2 -986        Stool Occurrence 3 x 1 x      PHYSICAL EXAMINATION: General:  Awake and intubated, NAD Neuro:  Awake, alert, nonverbal due to intubation, follows commands HEENT:  OETT, MM moist and pink Cardiovascular:  Irregular, no murmurs Lungs:  Bilateral air entry, rhonchi R>L Abdomen:  Soft, PEG site intact, bowel sounds present Musculoskeletal:  Torticollis, contractures, bilateral foot drop Skin:  Penile erosion, skin thin and shiny  LABS:  CBC  Recent Labs Lab 04/11/13 0500 04/12/13 0250 04/13/13 0755  WBC 14.9* 11.5* 13.0*  HGB 10.3* 10.4* 9.6*  HCT 29.6* 30.2* 28.6*  PLT 107* 85* 90*   Coag's  Recent Labs Lab 04/09/13 0755  APTT 24  INR 1.27   BMET  Recent Labs Lab 04/11/13 0500 04/12/13 0250 04/13/13 0755  NA 139 137 142  K 2.8* 2.9* 4.3  CL 105 103 111  CO2 21 19 22   BUN 37* 27* 23  CREATININE 1.81* 1.30 1.11  GLUCOSE 103* 201* 147*   Electrolytes  Recent Labs Lab 04/09/13 0755 04/09/13 1000  04/11/13 0500 04/12/13 0250 04/13/13 0755  CALCIUM 8.8  --   < > 7.6* 7.7* 8.1*  MG  --  1.6  --   --   --   --   < > = values in this interval not displayed. Sepsis Markers  Recent Labs Lab 04/10/13 0914 04/11/13 0500  04/12/13 0250  PROCALCITON 113.10 92.17 39.22    ABG  Recent Labs Lab 04/09/13 0929 04/09/13 1210  PHART 7.325* 7.369  PCO2ART 43.2 33.2*  PO2ART 101.0* 64.0*   Liver Enzymes  Recent Labs Lab 04/09/13 0755 04/10/13 0445  AST 23 17  ALT 14 12  ALKPHOS 66 60  BILITOT 0.6 0.5  ALBUMIN 3.1* 2.5*   Cardiac Enzymes  Recent Labs Lab 04/09/13 0755 04/09/13 1517  TROPONINI <0.30 0.63*   Glucose  Recent Labs Lab 04/12/13 1652 04/12/13 1950 04/12/13 2325 04/13/13 0453 04/13/13 0845 04/13/13 1222  GLUCAP 125* 129* 101* 118* 130* 113*    CXR:  11/23 >>> Hardware in good position, more prominent bilateral scattered airspace disease.  Infiltrate vs. Atelectasis??  ASSESSMENT / PLAN:  PULMONARY A:  Acute respiratory failure in setting of  pneumonia (CAP vs aspiration) - improving.   Possibly element of pulmonary edema in setting of VT. P:   - Goal pH>7.30, SpO2>92 - Cont to wean as tolerated; I suspect that he will need a trach to be vent free. Discussed this with him and his wife 11/25 for them to consider - VAP bundle -Trend CXR  CARDIOVASCULAR A:  VT resolved with defibrillation.  STEMI s/p cardiac cath without intervention.  H/o CAD, HTN, AF. 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 P:  - Goal MAP 60-65 - Cardiology following  - Amiodarone - Heparin gtt - ASA, Plavix, Lipitor, Metoprolol  RENAL A:  AKI / oliguria - improving   Hypokalemia. Acquired hypospadius, chronic P:   - Trend BMP - NS@KVO  - appreciate Dr Belva Crome eval; hypospadius does not appear infected  GASTROINTESTINAL A:  Dysphagia s/p PEG. P:   - TF per Nutritionist - Protonix for GI Px  HEMATOLOGIC A:   Hemodilution.   Therapeutic anticoagulation for ACS- NOT chronically anticoagulated for AF (reportedly). P:  - Trend CBC - Heparin gtt  INFECTIOUS A:  Pneumonia (aspiration vs CAP), PCT 92. P:   - Abx / cx as above, day 5 11/25 - Monitor fever curve and WBCs  ENDOCRINE A:  Hyperglycemia. P:   - SSI  NEUROLOGIC A:   Encephalopathy.  H/o multiple CVAs with significant residual deficit. Contractures P:   - Goal RASS 0 to -1 - Fentanyl PRN - Foot drop boots  Today's summary:  Will likely need trach to be vent free.  Will continue abx as ordered. .    35 minutes CC time  Levy Pupa, MD, PhD 04/13/2013, 4:22 PM [place] [facility] [phone] or if no answer [phone]

## 2013-04-13 NOTE — Progress Notes (Addendum)
ANTICOAGULATION CONSULT NOTE - Follow Up Consult  Pharmacy Consult for heparin Indication: CAD  Labs:  Recent Labs  04/11/13 0500  04/12/13 0250 04/12/13 1521 04/13/13 0755 04/13/13 0930  HGB 10.3*  --  10.4*  --  9.6*  --   HCT 29.6*  --  30.2*  --  28.6*  --   PLT 107*  --  85*  --  90*  --   HEPARINUNFRC  --   < > 0.25* 0.68  --  0.56  CREATININE 1.81*  --  1.30  --  1.11  --   < > = values in this interval not displayed.   Assessment: 72yo male on heparin drip for ACS/Afib.  Heparin drip resumed post-cath now at 1600 units/hr and has been confirmed at goal (HL= 0.56). Noted considering long term anticoagulation, continuing only heparin for now.  Goal of Therapy:  Heparin level 0.3-0.7 units/ml Monitor platelets per protocol: Yes   Plan:  -Continue heparin at 1600 units/hr -Daily heparin level and CBC -INR in am to obtain a baseline INR  Harland German, Pharm D 04/13/2013 1:43 PM     PM addendum Begin wrfarin 5mg  x1 Daily protime  Leota Sauers Pharm.D. CPP, BCPS Clinical Pharmacist [phone] 04/13/2013 10:15 PM

## 2013-04-13 NOTE — Progress Notes (Signed)
CARDIOLOGY PROGRESS NOTE  Subjective:  Meatal tear, Urology consulted and recommends using a tube holder to prevent further erosion. BP elevated over the last 24 hours. Unable to wean the vent yesterday. Increased WBC count today with increased sputum production per nursing.  Objective:  Vital Signs in the last 24 hours: Temp:  [98.7 F (37.1 C)-100.4 F (38 C)] 98.7 F (37.1 C) (11/25 0409) Pulse Rate:  [63-92] 70 (11/25 0700) Resp:  [14-35] 14 (11/25 0700) BP: (106-255)/(69-192) 156/83 mmHg (11/25 0700) SpO2:  [97 %-100 %] 100 % (11/25 0700) FiO2 (%):  [40 %] 40 % (11/25 0500) Weight:  [157 lb 10.1 oz (71.5 kg)] 157 lb 10.1 oz (71.5 kg) (11/25 0500)  Intake/Output from previous day: 11/24 0701 - 11/25 0700 In: 2894.9 [I.V.:784.9; NG/GT:1560; IV Piggyback:300] Out: 1200 [Urine:1200]  Physical Exam: General: Lying in bed, intubated.  HEENT: normal, ETT in place Neck: JVP appears normal, left deviated torticollis  Lungs: Intubated. CTA bilaterally anterioly, diminished airflow CV: RRR without murmur or gallop Abd: soft, NT, diminished BS, no hepatomegaly, PEG site with no surrounding edema/erythema Ext: BLE with vascular changes.  Skin: warm/dry no rash. Feet with foam bandages. Neuro: Alert, nods to questions appropriately, follows commands, right hemiplegia   Lab Results:  Recent Labs  04/11/13 0500 04/12/13 0250  WBC 14.9* 11.5*  HGB 10.3* 10.4*  PLT 107* 85*    Recent Labs  04/11/13 0500 04/12/13 0250  NA 139 137  K 2.8* 2.9*  CL 105 103  CO2 21 19  GLUCOSE 103* 201*  BUN 37* 27*  CREATININE 1.81* 1.30   No results found for this basename: TROPONINI, CK, MB,  in the last 72 hours  Cardiac Studies: 04/09/13 Left heart catheterization Final Conclusions:  1. Single vessel obstructive coronary disease. Patient does have a critical stenosis in a nondominant right coronary. The large dominant left coronary system is without significant disease.    Recommendations: I recommend medical management of his coronary disease. I do not think that the right coronary lesion is the cause of his acute decompensation. Chest x-ray indicates a significant right lung pneumonia. It is possible that his ST changes may be related to pericarditis in association with his pneumonia. We will manage him with IV heparin and nitroglycerin. We'll continue aspirin and beta blocker therapy. We will add Plavix. Critical care medicine was consult for management of his pneumonia and respiratory failure. Patient is critically ill and prognosis is poor.    Tele: Sinus rhythm, rate 71. Tele reviewed.   Assessment/Plan:  72yo male with PMH of multiple CVAs and chronic aphasia presented with acute respiratory failure associated with atrial fibrillation a rapid ventricular response, was found to have PNA and diffuse ST elevation more pronounced in the inferior lateral leads on EKG, and in the ED the patient decompensated and required intubation and cardioversion x1 2/2 sustained VT, was started on an amiodarone drip and was transferred to the cath lab for [facility].   1. STEMI: Diffuse ST elevations on EKG, more prominent in the inferior leads. Troponin with mild elevation to 0.63. There was delay in bringing the patient to the cath lab because of his hemodynamic instability and needs for management of his respiratory failure in the emergency department. LHC with mid RCA 95% occlusion, nondominant vessel. No intervention was performed given that the vessel was nondominant, and the pt is to be managed medically on ASA, Plavix, heparin drip. Nitro drip discontinued. 2D echo with no pericardial effusion,  EF of 45-50%, possible akinesis of the distalanteroseptal and apical myocardium, grade 2 diastolic dysfunction, and mild aortic regurgitation. Continuing statin, BB. Restarting ACEi today. Continuing the heparin drip for now while we considering the need for long term anticoagulation given  his h/o afib. Continuing the ASA and Plavix.     2. Atrial fibrillation with RVR/VT: Pt with reported h/o chronic afib (The history documented is that the wife says that he has had irregular heartbeats but if there is documentation of atrial fib in the past). On ASA 325 at home as well as Atenolol 75mg  BID. Per his wife, pt rate controlled. Pt in afib on arrival to the ED. Pt with sustained VT requiring cardioversion and he was started on an Amiodarone drip which he remains on this morning. Afib resolved. Pt remains in sinus rhythm this morning. Pt transitioned top po amiodarone 400mg  BID on 11/24. Still considering the long term need for an antiarrhythmic and for anticoagulation for this patient. Continuing heparin for now.  3. Acute respiratory failure/CAP: Pt with acute respiratory failure requiring intubation. On CXR right multilobar airspace disease with involvement in the left base concerning for PNA were noted. Mild increase in WBC count to 13 today. Pt was started on Azithromycin and Rocephin for CAP coverage, day #5. Management per PCCM.   4. HTN: Pt on BB, ACEi, and diuretic at home. Held on admission 2/2 hypotension; he did not require pressors this admission with MAP >60, goal 60-65. BP began to increase over the past day, and he is now hypertensive. He is currently on metoprolol 25mg  q6h per tube with hydralazine PRN. Adding Lisinopril and will up titrate as tolerated. Will likely need to add an additional agent for better control. He is on a diuretic at home, so giving 20 IV Lasix x1.   5. Combined CHF: TTE on 04/10/13 with EF 45-50% with possible akinesis of the distal anteroseptal and apical myocardium and grad 2 diastolic dysfunction. CXR today with interstitial edema. Pt is on HCTZ at home. Giving Lasix 20 IV x1.   6. Hypokalemia:  Pt has been hypokalemic over the past few days requiring replacement. Potassium 4.3 today.  7. AKI: Pt with Cr of 1.21 on admission, which trended to 1.91.  Unsure of baseline renal function. Renal function continues to improve. Cr 1.1 this morning.   Genelle Gather, MD Internal Medicine Resident, PGY II [facility] Internal Medicine Program 04/13/2013 8:20 AM    Attending Note:   The patient was seen and examined.  Agree with assessment and plan as noted above.  Changes made to the above note as needed.  Pt is at high risk for more strokes.  He is also at risk for bleeding.  He is basically bed / wheel chair bound.    I would favor starting coumadin.  The decision for long term anticoagulation will be up to his primary medical doctor.  The wife currently moves hm from bed to chair - I would suggest that she use her lift to avoid falls.   Vesta Mixer, Montez Hageman., MD, [facility] 04/13/2013, 6:59 PM

## 2013-04-14 ENCOUNTER — Inpatient Hospital Stay (HOSPITAL_COMMUNITY): Payer: Medicare Other

## 2013-04-14 LAB — CBC
HCT: 29.7 % — ABNORMAL LOW (ref 39.0–52.0)
MCH: 31.8 pg (ref 26.0–34.0)
MCHC: 33.7 g/dL (ref 30.0–36.0)
MCV: 94.6 fL (ref 78.0–100.0)
Platelets: 111 10*3/uL — ABNORMAL LOW (ref 150–400)
RBC: 3.14 MIL/uL — ABNORMAL LOW (ref 4.22–5.81)
RDW: 15.9 % — ABNORMAL HIGH (ref 11.5–15.5)
WBC: 15.2 10*3/uL — ABNORMAL HIGH (ref 4.0–10.5)

## 2013-04-14 LAB — GLUCOSE, CAPILLARY
Glucose-Capillary: 107 mg/dL — ABNORMAL HIGH (ref 70–99)
Glucose-Capillary: 109 mg/dL — ABNORMAL HIGH (ref 70–99)
Glucose-Capillary: 114 mg/dL — ABNORMAL HIGH (ref 70–99)
Glucose-Capillary: 115 mg/dL — ABNORMAL HIGH (ref 70–99)

## 2013-04-14 LAB — BASIC METABOLIC PANEL
BUN: 20 mg/dL (ref 6–23)
CO2: 22 mEq/L (ref 19–32)
Chloride: 110 mEq/L (ref 96–112)
Creatinine, Ser: 1.03 mg/dL (ref 0.50–1.35)
GFR calc Af Amer: 82 mL/min — ABNORMAL LOW (ref >90)
GFR calc non Af Amer: 71 mL/min — ABNORMAL LOW (ref >90)
Sodium: 143 mEq/L (ref 135–145)

## 2013-04-14 LAB — HEPARIN LEVEL (UNFRACTIONATED): Heparin Unfractionated: 0.37 IU/mL (ref 0.30–0.70)

## 2013-04-14 LAB — PROTIME-INR
INR: 1.14 (ref 0.00–1.49)
Prothrombin Time: 14.4 seconds (ref 11.6–15.2)

## 2013-04-14 MED ORDER — WARFARIN SODIUM 5 MG PO TABS
5.0000 mg | ORAL_TABLET | Freq: Once | ORAL | Status: DC
  Filled 2013-04-14: qty 1

## 2013-04-14 MED ORDER — LISINOPRIL 10 MG PO TABS
10.0000 mg | ORAL_TABLET | Freq: Every day | ORAL | Status: DC
  Administered 2013-04-14: 10 mg via ORAL
  Filled 2013-04-14 (×2): qty 1

## 2013-04-14 NOTE — Progress Notes (Signed)
PULMONARY  / CRITICAL CARE MEDICINE  Name: [name] [name] MRN: [id number] DOB: [birth year]    ADMISSION DATE:  04/09/2013 CONSULTATION DATE:  04/09/2013  REFERRING MD :  Cardiology PRIMARY SERVICE: PCCM  CHIEF COMPLAINT:  Acute respiratory failure  BRIEF PATIENT DESCRIPTION: 72 yo with history of multiple CVA (aphasia, PEG) admitted with respiratory failure in setting of pneumonia and STEMI.  SIGNIFICANT EVENTS / STUDIES:  11/21  Admitted with respiratory failure in setting of pneumonia and STEMI 11/21  Cardiac cath >>> 95% occluded RCA, no intervention due to non dominant nature 11/22  TTE >>> EF 45-50%, abnormal relaxation, mild aortic regurgitation  LINES / TUBES: Foley 11/21 >>> PEG ??? >>> R fem CVL 11/21 >>> R fem A-Line 11/21 >>>  CULTURES: 11/21 Blood >>> Neg 11/22 Sputum >>> WBCs, no organisms  ANTIBIOTICS: Azithromycin 11/21 >>> Ceftriaxone 11/21 >>>  SUBJECTIVE:   Tolerates PSV for 10-60 minutes but then decompensates  VITAL SIGNS: Temp:  [98 F (36.7 C)-100.2 F (37.9 C)] 98 F (36.7 C) (11/26 1200) Pulse Rate:  [61-86] 71 (11/26 1500) Resp:  [14-23] 14 (11/26 1500) BP: (140-192)/(52-132) 163/91 mmHg (11/26 1500) SpO2:  [97 %-100 %] 100 % (11/26 1500) FiO2 (%):  [40 %] 40 % (11/26 1500) Weight:  [72.8 kg (160 lb 7.9 oz)] 72.8 kg (160 lb 7.9 oz) (11/26 0452)  HEMODYNAMICS:   VENTILATOR SETTINGS: Vent Mode:  [-] PRVC FiO2 (%):  [40 %] 40 % Set Rate:  [14 bmp] 14 bmp Vt Set:  [500 mL] 500 mL PEEP:  [5 cmH20] 5 cmH20 Plateau Pressure:  [16 cmH20-23 cmH20] 16 cmH20  INTAKE / OUTPUT: Intake/Output     11/25 0701 - 11/26 0700 11/26 0701 - 11/27 0700   I.V. (mL/kg) 624 (8.6) 198 (2.7)   Other 490    NG/GT 1560 520   IV Piggyback 300 300   Total Intake(mL/kg) 2974 (40.9) 1018 (14)   Urine (mL/kg/hr) 3215 (1.8) 395 (0.5)   Total Output 3215 395   Net -241 +623        Stool Occurrence 1 x      PHYSICAL EXAMINATION: General:  Awake and  intubated, NAD Neuro:  Awake, alert, nonverbal due to intubation, follows commands HEENT:  OETT, MM moist and pink Cardiovascular:  Irregular, no murmurs Lungs:  Bilateral air entry, rhonchi R>L Abdomen:  Soft, PEG site intact, bowel sounds present Musculoskeletal:  Torticollis, contractures, bilateral foot drop Skin:  Penile erosion, skin thin and shiny  LABS:  CBC  Recent Labs Lab 04/12/13 0250 04/13/13 0755 04/14/13 0545  WBC 11.5* 13.0* 15.2*  HGB 10.4* 9.6* 10.0*  HCT 30.2* 28.6* 29.7*  PLT 85* 90* 111*   Coag's  Recent Labs Lab 04/09/13 0755 04/14/13 0545  APTT 24  --   INR 1.27 1.14   BMET  Recent Labs Lab 04/12/13 0250 04/13/13 0755 04/14/13 0545  NA 137 142 143  K 2.9* 4.3 4.7  CL 103 111 110  CO2 19 22 22   BUN 27* 23 20  CREATININE 1.30 1.11 1.03  GLUCOSE 201* 147* 118*   Electrolytes  Recent Labs Lab 04/09/13 0755 04/09/13 1000  04/12/13 0250 04/13/13 0755 04/14/13 0545  CALCIUM 8.8  --   < > 7.7* 8.1* 8.4  MG  --  1.6  --   --   --   --   < > = values in this interval not displayed. Sepsis Markers  Recent Labs Lab 04/10/13 0914 04/11/13 0500 04/12/13 0250  PROCALCITON  113.10 92.17 39.22    ABG  Recent Labs Lab 04/09/13 0929 04/09/13 1210  PHART 7.325* 7.369  PCO2ART 43.2 33.2*  PO2ART 101.0* 64.0*   Liver Enzymes  Recent Labs Lab 04/09/13 0755 04/10/13 0445  AST 23 17  ALT 14 12  ALKPHOS 66 60  BILITOT 0.6 0.5  ALBUMIN 3.1* 2.5*   Cardiac Enzymes  Recent Labs Lab 04/09/13 0755 04/09/13 1517  TROPONINI <0.30 0.63*   Glucose  Recent Labs Lab 04/13/13 1542 04/13/13 2006 04/14/13 0022 04/14/13 0450 04/14/13 0724 04/14/13 1153  GLUCAP 98 114* 115* 102* 107* 113*    CXR:  11/26 >>> Cardiomegaly. Bilateral airspace  opacities, left slightly greater than right may reflect asymmetric  edema or infection.    ASSESSMENT / PLAN:  PULMONARY A:  Acute respiratory failure in setting of pneumonia (CAP  vs aspiration) - improving.   Possibly element of pulmonary edema in setting of VT. P:   - Goal pH>7.30, SpO2>92 - Cont to wean as tolerated; I suspect that he will need a trach to be vent free. Discussed this with him and his wife 11/25 and 26. Wife agrees - VAP bundle -Trend CXR  CARDIOVASCULAR A:  VT resolved with defibrillation.  STEMI s/p cardiac cath without intervention.  H/o CAD, HTN, AF. 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 P:  - Goal MAP 60-65 - Cardiology following  - Amiodarone - Heparin gtt - ASA, Plavix, Lipitor, Metoprolol  RENAL A:  AKI / oliguria - improving   Hypokalemia. Acquired hypospadius, chronic P:   - Trend BMP - NS@KVO  - appreciate Dr Belva Crome eval; hypospadius does not appear infected  GASTROINTESTINAL A:  Dysphagia s/p PEG. P:   - TF per Nutritionist - Protonix for GI Px  HEMATOLOGIC A:   Hemodilution.   Therapeutic anticoagulation for ACS- NOT chronically anticoagulated for AF (reportedly). P:  - Trend CBC - Heparin gtt  INFECTIOUS A:  Pneumonia (aspiration vs CAP), PCT 92. P:   - Abx / cx as above, day 6 11/26 - Monitor fever curve and WBCs  ENDOCRINE A:  Hyperglycemia. P:   - SSI  NEUROLOGIC A:   Encephalopathy.  H/o multiple CVAs with significant residual deficit. Contractures P:   - Goal RASS 0 to -1 - Fentanyl PRN - Foot drop boots  Today's summary:  Suspect will need trach to be vent free unless he improves significantly w treatment PNA.  Will continue abx as ordered.   35 minutes CC time  Levy Pupa, MD, PhD 04/14/2013, 5:00 PM [facility] [phone] or if no answer [phone]

## 2013-04-14 NOTE — Progress Notes (Signed)
ANTICOAGULATION CONSULT NOTE - Follow Up Consult  Pharmacy Consult for heparin Indication: CAD  Labs:  Recent Labs  04/12/13 0250 04/12/13 1521 04/13/13 0755 04/13/13 0930 04/14/13 0545  HGB 10.4*  --  9.6*  --  10.0*  HCT 30.2*  --  28.6*  --  29.7*  PLT 85*  --  90*  --  111*  LABPROT  --   --   --   --  14.4  INR  --   --   --   --  1.14  HEPARINUNFRC 0.25* 0.68  --  0.56 0.37  CREATININE 1.30  --  1.11  --  1.03     Assessment: 72yo male on heparin drip for ACS/Afib.  Heparin drip resumed post-cath now at 1600 units/hr and has been confirmed at goal (HL= 0.37).  Coumadin started 11/25 and INR today is 1.14 (patient also noted on amiodarone and antibiotics which may increase coumadin sensitivity.  Goal of Therapy:  Heparin level 0.3-0.7 units/ml Monitor platelets per protocol: Yes   Plan:  -Continue heparin at 1600 units/hr -Daily heparin level and CBC -Coumadin 5mg  po -Daily PT/INR  Harland German, Pharm D 04/14/2013 7:57 AM

## 2013-04-14 NOTE — Progress Notes (Signed)
NUTRITION FOLLOW UP  Intervention:   1.  Enteral nutrition; continue TF of Jevity 1.2 via OGT @  68ml/hr which will provide 1872 calories, 86g protein, free water and meet 101% estimated calorie needs, 102% estimated protein needs.  Pt has reached goal.  Nutrition Dx:   Inadequate oral intake related to inability to eat, Ongoing  Goal:   1. Enteral nutrition; initiation with tolerance. Pt to meet >/=90% estimated needs with nutrition support - not met, but in process of starting TF 2. Wt/wt change; monitor trends - significant weight drop documented  Monitor:   Weights, labs, vent status, TF initiation/tolerance/advancement  Assessment:   Pt admitted with NSTEMI. 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 He has some ability to communication per wife, however is not verbal.  Home regimen is: 1 can Osmolite 1.2 + 1 can Jevity 1.2 in the mornings for a total of 2 cans daily. In addition, pt consumes meals and snacks throughout the day. Wife prepares foods soft and bland, and mashes them into a soft bites. Pt can chew and swallow well at baseline.   Patient is currently intubated on ventilator support.  MV: 9.9 L/min Temp:Temp (24hrs), Avg:99.1 F (37.3 C), Min:98.3 F (36.8 C), Max:100.2 F (37.9 C)  Pt unable to wean from vent, plans for possible trach today.  Residuals:  0 mL  Height: Ht Readings from Last 1 Encounters:  04/09/13 5\' 6"  (1.676 m)    Weight Status:   Wt Readings from Last 1 Encounters:  04/14/13 160 lb 7.9 oz (72.8 kg)    Re-estimated needs:  Kcal: 1847 Protein: 84-98g Fluid: 1.8 L/day  Skin: intact  Diet Order: NPO   Intake/Output Summary (Last 24 hours) at 04/14/13 1224 Last data filed at 04/14/13 1000  Gross per 24 hour  Intake   2472 ml  Output   1690 ml  Net    782 ml    Last BM: 11/25  Labs:   Recent Labs Lab 04/09/13 0800 04/09/13 1000  04/12/13 0250 04/13/13 0755 04/14/13 0545  NA 144  --   < >  137 142 143  K 4.0  --   < > 2.9* 4.3 4.7  CL 105  --   < > 103 111 110  CO2  --   --   < > 19 22 22   BUN 21  --   < > 27* 23 20  CREATININE 1.40*  --   < > 1.30 1.11 1.03  CALCIUM  --   --   < > 7.7* 8.1* 8.4  MG  --  1.6  --   --   --   --   GLUCOSE 203*  --   < > 201* 147* 118*  < > = values in this interval not displayed.  CBG (last 3)   Recent Labs  04/14/13 0450 04/14/13 0724 04/14/13 1153  GLUCAP 102* 107* 113*    Scheduled Meds: . amiodarone  400 mg Per Tube Daily  . antiseptic oral rinse  15 mL Mouth Rinse QID  . aspirin  81 mg Per J Tube Daily  . atorvastatin  40 mg Per Tube q1800  . azithromycin  500 mg Intravenous Q24H  . cefTRIAXone (ROCEPHIN)  IV  2 g Intravenous Q24H  . chlorhexidine  15 mL Mouth Rinse BID  . insulin aspart  0-15 Units Subcutaneous Q4H  . lisinopril  10 mg Oral Daily  . metoprolol tartrate  25 mg Per Tube Q6H  . oxybutynin  5 mg Per Tube Daily  . pantoprazole sodium  40 mg Per Tube Daily  . potassium chloride  60 mEq Per Tube BID    Continuous Infusions: . sodium chloride 10 mL/hr at 04/13/13 2000  . sodium chloride Stopped (04/12/13 1400)  . sodium chloride 10 mL/hr (04/12/13 2009)  . feeding supplement (JEVITY 1.2 CAL) 1,000 mL (04/14/13 0909)  . heparin 1,600 Units/hr (04/14/13 0911)    Loyce Dys, MS RD LDN Clinical Inpatient Dietitian Pager: [phone] Weekend/After hours pager: [phone]

## 2013-04-14 NOTE — Progress Notes (Addendum)
CARDIOLOGY PROGRESS NOTE  Subjective:  BP improved with addition of ACEi. Plans for trach possibly today by PCCM, as unable to wean from the vent at this time.    Objective:  Vital Signs in the last 24 hours: Temp:  [97.7 F (36.5 C)-99.1 F (37.3 C)] 98.3 F (36.8 C) (11/26 0400) Pulse Rate:  [61-86] 73 (11/26 0500) Resp:  [14-36] 14 (11/26 0500) BP: (118-208)/(52-125) 162/91 mmHg (11/26 0400) SpO2:  [97 %-100 %] 99 % (11/26 0500) FiO2 (%):  [40 %] 40 % (11/26 0452) Weight:  [160 lb 7.9 oz (72.8 kg)] 160 lb 7.9 oz (72.8 kg) (11/26 0452)  Intake/Output from previous day: 11/25 0701 - 11/26 0700 In: 2792 [I.V.:572; NG/GT:1430; IV Piggyback:300] Out: 3215 [Urine:3215]  Physical Exam: General: Lying in bed, intubated.  HEENT: normal, ETT in place Neck: JVP appears normal, left deviated torticollis  Lungs: Intubated. CTA bilaterally anterioly, diminished airflow CV: RRR without murmur or gallop Abd: soft, NT, diminished BS, no hepatomegaly, PEG site with no surrounding edema/erythema Ext: BLE with vascular changes.  Skin: warm/dry no rash. Feet with foam bandages. Neuro: Alert, nods to questions appropriately, follows commands, right hemiplegia   Lab Results:  Recent Labs  04/12/13 0250 04/13/13 0755  WBC 11.5* 13.0*  HGB 10.4* 9.6*  PLT 85* 90*    Recent Labs  04/12/13 0250 04/13/13 0755  NA 137 142  K 2.9* 4.3  CL 103 111  CO2 19 22  GLUCOSE 201* 147*  BUN 27* 23  CREATININE 1.30 1.11   No results found for this basename: TROPONINI, CK, MB,  in the last 72 hours  Cardiac Studies: 04/09/13 Left heart catheterization Final Conclusions:  1. Single vessel obstructive coronary disease. Patient does have a critical stenosis in a nondominant right coronary. The large dominant left coronary system is without significant disease.   Recommendations: I recommend medical management of his coronary disease. I do not think that the right coronary lesion is  the cause of his acute decompensation. Chest x-ray indicates a significant right lung pneumonia. It is possible that his ST changes may be related to pericarditis in association with his pneumonia. We will manage him with IV heparin and nitroglycerin. We'll continue aspirin and beta blocker therapy. We will add Plavix. Critical care medicine was consult for management of his pneumonia and respiratory failure. Patient is critically ill and prognosis is poor.   04/10/13 TTE Study Conclusions  - Left ventricle: The cavity size was normal. Systolic function was mildly reduced. The estimated ejection fraction was in the range of 45% to 50%. Possible akinesis of the distalanteroseptal and apical myocardium. Features are consistent with a pseudonormal left ventricular filling pattern, with concomitant abnormal relaxation and increased filling pressure (grade 2 diastolic dysfunction). - Aortic valve: Mild regurgitation.   Tele: Sinus rhythm with multiple PVCs, rate 76-82.   Assessment/Plan:  72yo male with PMH of multiple CVAs and chronic aphasia presented with acute respiratory failure associated with atrial fibrillation a rapid ventricular response, was found to have PNA and diffuse ST elevation more pronounced in the inferior lateral leads on EKG, and in the ED the patient decompensated and required intubation and cardioversion x1 2/2 sustained VT, was started on an amiodarone drip and was transferred to the cath lab for [facility].   1. STEMI: Diffuse ST elevations on EKG, more prominent in the inferior leads. Troponin with mild elevation to 0.63. There was delay in bringing the patient to the cath lab because of  his hemodynamic instability and needs for management of his respiratory failure in the emergency department. LHC with mid RCA 95% occlusion, nondominant vessel. No intervention was performed given that the vessel was nondominant, and the pt is to be managed medically on ASA, Plavix, heparin  drip. Nitro drip discontinued. 2D echo with no pericardial effusion, EF of 45-50%, possible akinesis of the distalanteroseptal and apical myocardium, grade 2 diastolic dysfunction, and mild aortic regurgitation. Continuing statin, BB and ACEi, may need to up titrate today. Continuing the heparin drip for now while we considering the need for long term anticoagulation given his h/o afib; he will likely need to start long term anticoagulation but this should be discussed with the patient's wife and PCP. Continuing the ASA and Plavix.     2. Atrial fibrillation with RVR/VT: Pt with reported h/o chronic afib (The history documented is that the wife says that he has had irregular heartbeats but if there is documentation of atrial fib in the past). On ASA 325 at home as well as Atenolol 75mg  BID. Per his wife, pt rate controlled. Pt in afib on arrival to the ED. Pt with sustained VT requiring cardioversion and he was started on an Amiodarone drip which he remains on this morning. Afib resolved. Pt remains in sinus rhythm this morning. Pt transitioned top po amiodarone 400mg  BID on 11/24. Still considering the long term need for an antiarrhythmic and for anticoagulation for this patient. Continuing heparin for now.  3. Acute respiratory failure/CAP: Pt with acute respiratory failure requiring intubation. On CXR right multilobar airspace disease with involvement in the left base concerning for PNA were noted. Mild increase in WBC count to 13 today. Pt was started on Azithromycin and Rocephin for CAP coverage, day #6. CXR and CBC pending. Possible tracheostomy today as he has been unable to wean off the vent. Management per PCCM.   4. HTN: Pt on BB, ACEi, and diuretic at home. Held on admission 2/2 hypotension; he did not require pressors this admission with MAP >60, goal 60-65. BP began to increase over the past day, and he is now hypertensive. He is currently on metoprolol 25mg  q6h per tube with hydralazine PRN.  Added Lisinopril 5mg  on 11/25 and BP improved. Increasing to 10mg  today.  5. Combined CHF: TTE on 04/10/13 with EF 45-50% with possible akinesis of the distal anteroseptal and apical myocardium and grad 2 diastolic dysfunction. CXR on 11/25 with interstitial edema. Pt given Lasix 20 IV x1. Checking CXR this morning.  6. Hypokalemia:  Pt has been hypokalemic over the past few days requiring replacement. Potassium 4.3 yesterday. AM BMP pending. Will replace as needed.  7. AKI: Pt with Cr of 1.21 on admission, which trended to 1.91. Unsure of baseline renal function. Renal function continues to improve. BMP pending this morning.   Genelle Gather, MD Internal Medicine Resident, PGY II [facility] Internal Medicine Program 04/14/2013 6:49 AM   Patient seen and examined and history reviewed. Agree with above findings and plan. Patient presented with acute respiratory failure with PNA. This lead to VT requiring defibrillation. He had diffuse ST elevation and underwent emergent cardiac cath. This showed 95% stenosis in nondominant RCA. Ecg still shows diffuse ST elevation. Echo without pericaridal effusion. Maintaining NSR on amiodarone. Would continue  400 mg daily. On IV heparin now. Would anticoagulate with coumadin once need for procedures (ie trach) completed. Once on Coumadin I would stop plavix. Defer pulmonary mangement to CCM.  Theron Arista [facility] 04/14/2013 10:35 AM

## 2013-04-15 LAB — HEPARIN LEVEL (UNFRACTIONATED): Heparin Unfractionated: 0.3 IU/mL (ref 0.30–0.70)

## 2013-04-15 LAB — CBC
HCT: 40.4 % (ref 39.0–52.0)
Hemoglobin: 12.7 g/dL — ABNORMAL LOW (ref 13.0–17.0)
MCHC: 31.4 g/dL (ref 30.0–36.0)
RBC: 4.26 MIL/uL (ref 4.22–5.81)

## 2013-04-15 LAB — CULTURE, BLOOD (ROUTINE X 2): Culture: NO GROWTH

## 2013-04-15 LAB — BASIC METABOLIC PANEL
BUN: 19 mg/dL (ref 6–23)
CO2: 24 mEq/L (ref 19–32)
GFR calc non Af Amer: 83 mL/min — ABNORMAL LOW (ref >90)
Glucose, Bld: 121 mg/dL — ABNORMAL HIGH (ref 70–99)
Potassium: 5.5 mEq/L — ABNORMAL HIGH (ref 3.5–5.1)
Sodium: 143 mEq/L (ref 135–145)

## 2013-04-15 LAB — GLUCOSE, CAPILLARY
Glucose-Capillary: 124 mg/dL — ABNORMAL HIGH (ref 70–99)
Glucose-Capillary: 100 mg/dL — ABNORMAL HIGH (ref 70–99)
Glucose-Capillary: 114 mg/dL — ABNORMAL HIGH (ref 70–99)

## 2013-04-15 LAB — PROTIME-INR
INR: 1.26 (ref 0.00–1.49)
Prothrombin Time: 15.5 seconds — ABNORMAL HIGH (ref 11.6–15.2)

## 2013-04-15 MED ORDER — NITROGLYCERIN 2 % TD OINT
1.0000 [in_us] | TOPICAL_OINTMENT | Freq: Four times a day (QID) | TRANSDERMAL | Status: DC
  Administered 2013-04-15 – 2013-04-17 (×8): 1 [in_us] via TOPICAL
  Filled 2013-04-15: qty 30

## 2013-04-15 MED ORDER — LISINOPRIL 20 MG PO TABS
20.0000 mg | ORAL_TABLET | Freq: Every day | ORAL | Status: DC
  Administered 2013-04-15 – 2013-04-16 (×2): 20 mg via ORAL
  Filled 2013-04-15 (×3): qty 1

## 2013-04-15 MED ORDER — ATORVASTATIN CALCIUM 80 MG PO TABS
80.0000 mg | ORAL_TABLET | Freq: Every day | ORAL | Status: DC
  Administered 2013-04-15 – 2013-04-30 (×16): 80 mg
  Filled 2013-04-15 (×16): qty 1

## 2013-04-15 NOTE — Progress Notes (Signed)
PULMONARY  / CRITICAL CARE MEDICINE  Name: [name] [name] MRN: [id number] DOB: [birth year]    ADMISSION DATE:  04/09/2013 CONSULTATION DATE:  04/09/2013  REFERRING MD :  Cardiology PRIMARY SERVICE: PCCM  CHIEF COMPLAINT:  Acute respiratory failure  BRIEF PATIENT DESCRIPTION: 72 yo with history of multiple CVA (aphasia, PEG) admitted with respiratory failure in setting of pneumonia and STEMI.  SIGNIFICANT EVENTS / STUDIES:  11/21  Admitted with respiratory failure in setting of pneumonia and STEMI 11/21  Cardiac cath >>> 95% occluded RCA, no intervention due to non dominant nature 11/22  TTE >>> EF 45-50%, abnormal relaxation, mild aortic regurgitation  LINES / TUBES: Foley 11/21 >>> PEG ??? >>> R fem CVL 11/21 >>> R fem A-Line 11/21 >>>  CULTURES: 11/21 Blood >>> Neg 11/22 Sputum >>> WBCs, no organisms  ANTIBIOTICS: Azithromycin 11/21 >>> Ceftriaxone 11/21 >>>  SUBJECTIVE:   Large amounts of mucous plugging this AM with desaturation.  VITAL SIGNS: Temp:  [98 F (36.7 C)-100.7 F (38.2 C)] 100.2 F (37.9 C) (11/27 0400) Pulse Rate:  [69-96] 88 (11/27 0824) Resp:  [14-32] 22 (11/27 0824) BP: (143-192)/(64-132) 143/65 mmHg (11/27 0824) SpO2:  [99 %-100 %] 100 % (11/27 0824) FiO2 (%):  [40 %] 40 % (11/27 0824) Weight:  [71.7 kg (158 lb 1.1 oz)] 71.7 kg (158 lb 1.1 oz) (11/27 0423)  HEMODYNAMICS:   VENTILATOR SETTINGS: Vent Mode:  [-] PRVC FiO2 (%):  [40 %] 40 % Set Rate:  [14 bmp] 14 bmp Vt Set:  [500 mL] 500 mL PEEP:  [5 cmH20] 5 cmH20 Plateau Pressure:  [16 cmH20-36 cmH20] 30 cmH20  INTAKE / OUTPUT: Intake/Output     11/26 0701 - 11/27 0700 11/27 0701 - 11/28 0700   I.V. (mL/kg) 614 (8.6)    Other 90    NG/GT 1560    IV Piggyback 300    Total Intake(mL/kg) 2564 (35.8)    Urine (mL/kg/hr) 1635 (1)    Total Output 1635     Net +929            PHYSICAL EXAMINATION: General:  Awake and intubated, NAD Neuro:  Awake, alert, nonverbal due to  intubation, follows commands HEENT:  OETT, MM moist and pink Cardiovascular:  Irregular, no murmurs Lungs:  Bilateral air entry, rhonchi R>L Abdomen:  Soft, PEG site intact, bowel sounds present Musculoskeletal:  Torticollis, contractures, bilateral foot drop Skin:  Penile erosion, skin thin and shiny  LABS:  CBC  Recent Labs Lab 04/13/13 0755 04/14/13 0545 04/15/13 0454  WBC 13.0* 15.2* 10.1  HGB 9.6* 10.0* 12.7*  HCT 28.6* 29.7* 40.4  PLT 90* 111* 108*   Coag's  Recent Labs Lab 04/09/13 0755 04/14/13 0545 04/15/13 0454  APTT 24  --   --   INR 1.27 1.14 1.26   BMET  Recent Labs Lab 04/13/13 0755 04/14/13 0545 04/15/13 0454  NA 142 143 143  K 4.3 4.7 5.5*  CL 111 110 109  CO2 22 22 24   BUN 23 20 19   CREATININE 1.11 1.03 0.91  GLUCOSE 147* 118* 121*   Electrolytes  Recent Labs Lab 04/09/13 0755 04/09/13 1000  04/13/13 0755 04/14/13 0545 04/15/13 0454  CALCIUM 8.8  --   < > 8.1* 8.4 8.4  MG  --  1.6  --   --   --   --   < > = values in this interval not displayed. Sepsis Markers  Recent Labs Lab 04/10/13 0914 04/11/13 0500 04/12/13 0250  PROCALCITON 113.10  92.17 39.22    ABG  Recent Labs Lab 04/09/13 0929 04/09/13 1210  PHART 7.325* 7.369  PCO2ART 43.2 33.2*  PO2ART 101.0* 64.0*   Liver Enzymes  Recent Labs Lab 04/09/13 0755 04/10/13 0445  AST 23 17  ALT 14 12  ALKPHOS 66 60  BILITOT 0.6 0.5  ALBUMIN 3.1* 2.5*   Cardiac Enzymes  Recent Labs Lab 04/09/13 0755 04/09/13 1517  TROPONINI <0.30 0.63*   Glucose  Recent Labs Lab 04/14/13 1153 04/14/13 1747 04/14/13 2020 04/15/13 0041 04/15/13 0421 04/15/13 0850  GLUCAP 113* 114* 109* 124* 100* 114*    CXR:  11/26 >>> Cardiomegaly. Bilateral airspace  opacities, left slightly greater than right may reflect asymmetric  edema or infection.    ASSESSMENT / PLAN:  PULMONARY A:  Acute respiratory failure in setting of pneumonia (CAP vs aspiration) - improving.    Possibly element of pulmonary edema in setting of VT. P:   - Goal pH>7.30, SpO2>92 - If able to suction all mucous plugging then will need bronch today. - Once mucous plugging is complete then will begin PS trials. - Wife wants trach, would likely be an OR trach, will ask Dr. Delton Coombes to call ENT in AM for trach (wife ok with Friday or Monday, will defer to Dr. Delton Coombes). - VAP bundle. - Trend CXR.  CARDIOVASCULAR A:  VT resolved with defibrillation.  STEMI s/p cardiac cath without intervention.  H/o CAD, HTN, AF. 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 P:  - Goal MAP 60-65. - Cardiology following. - Amiodarone. - Heparin gtt. - ASA, Plavix, Lipitor, Metoprolol.  RENAL A:  AKI / oliguria - improving   Hypokalemia. Acquired hypospadius, chronic P:   - Trend BMP - NS@KVO  - Appreciate Dr Belva Crome eval; hypospadius does not appear infected, please call urology on Friday to address urinary catheter.  GASTROINTESTINAL A:  Dysphagia s/p PEG. P:   - TF per Nutritionist - Protonix for GI Px  HEMATOLOGIC A:   Hemodilution.   Therapeutic anticoagulation for ACS- NOT chronically anticoagulated for AF (reportedly). P:  - Trend CBC - Heparin gtt  INFECTIOUS A:  Pneumonia (aspiration vs CAP), PCT 92. P:   - Abx / cx as above, day 7 11/27 - Monitor fever curve and WBCs  ENDOCRINE A:  Hyperglycemia. P:   - SSI  NEUROLOGIC A:   Encephalopathy.  H/o multiple CVAs with significant residual deficit. Contractures P:   - Goal RASS 0 to -1 - Fentanyl PRN - Foot drop boots  Today's summary:  Will need trach, if unable to remove mucous plugging will bronch today, will need urology to see in AM (will defer to dr. Delton Coombes).   35 minutes CC time  Alyson Reedy, M.D. [facility] Pulmonary/Critical Care Medicine. Pager: [phone]. After hours pager: [phone].

## 2013-04-15 NOTE — Progress Notes (Signed)
ANTICOAGULATION CONSULT NOTE - Follow Up Consult  Pharmacy Consult for heparin Indication: CAD  Labs:  Recent Labs  04/13/13 0755 04/13/13 0930 04/14/13 0545 04/15/13 0454  HGB 9.6*  --  10.0* 12.7*  HCT 28.6*  --  29.7* 40.4  PLT 90*  --  111* 108*  LABPROT  --   --  14.4 15.5*  INR  --   --  1.14 1.26  HEPARINUNFRC  --  0.56 0.37 0.30  CREATININE 1.11  --  1.03 0.91     Assessment: 72yo male on heparin drip for ACS/Afib.  Heparin drip resumed post-cath now at 1600 units/hr and has been confirmed at goal (HL= 0.3).  Coumadin started 11/25 and INR today is 1.26 (patient also noted on amiodarone and antibiotics which may increase coumadin sensitivity). Warfarin on hold in anticipation of trach soon.  Goal of Therapy:  Heparin level 0.3-0.7 units/ml Monitor platelets per protocol: Yes   Plan:  -Increase heparin to 1700 units/hr -Daily heparin level and CBC   Celedonio Miyamoto, PharmD, [facility] Clinical Pharmacist Pager [phone]   04/15/2013 10:27 AM

## 2013-04-15 NOTE — Progress Notes (Signed)
Pt was bagged and lavaged this am with vent check due to increased PIP on vent and rhonchi bilaterally. Removed multiple large mucous plugs and patient has been stable throughout the day. PIP in 20's and lungs clear bilaterally with last vent check. RT will continue to follow.

## 2013-04-15 NOTE — Progress Notes (Signed)
eLink Physician-Brief Progress Note Patient Name: [name] [name] DOB: 05/21/1940 MRN: [id number]  Date of Service  04/15/2013   HPI/Events of Note  Mild hyperkalemia with K of 5.5   eICU Interventions  Plan: D/C standing KCl dosing   Intervention Category Intermediate Interventions: Electrolyte abnormality - evaluation and management  DETERDING,ELIZABETH 04/15/2013, 6:27 AM

## 2013-04-15 NOTE — Progress Notes (Signed)
Patient Name: [name] [name] Date of Encounter: 04/15/2013     Active Problems:   ST elevation myocardial infarction (STEMI) of inferolateral wall   CAD (coronary artery disease)   History of CVA (cerebrovascular accident)   History of TIA (transient ischemic attack)   VT (ventricular tachycardia)   Atrial fibrillation with RVR   Pulmonary mass   Acute respiratory failure   Aspiration pneumonia    SUBJECTIVE: Intubated, wife at bedside.    OBJECTIVE  Filed Vitals:   04/15/13 0423 04/15/13 0500 04/15/13 0600 04/15/13 0700  BP:  161/86 153/68 161/78  Pulse:  82 83 86  Temp:      TempSrc:      Resp:  18 15 21   Height:      Weight: 158 lb 1.1 oz (71.7 kg)     SpO2:  100% 100% 100%    Intake/Output Summary (Last 24 hours) at 04/15/13 0817 Last data filed at 04/15/13 0700  Gross per 24 hour  Intake   2473 ml  Output   1560 ml  Net    913 ml   Weight change: -2 lb 6.8 oz (-1.1 kg)  PHYSICAL EXAM  General: Intubated, awake, in no acute distress. Head: Normocephalic, atraumatic, sclera non-icteric, no xanthomas, nares are without discharge, ETT appreciated  Neck: Supple without bruits or JVD. Lungs:  Respirations assisted by ventilator. No wheezing, scattered rhonchi. No rales.  Heart: RRR no s3, s4, or murmurs. Abdomen: Soft, non-tender, non-distended, BS + x 4.  Msk:  Strength and tone appears decreased for age. Extremities: No clubbing, cyanosis or edema. DP/PT/Radials 2+ and equal bilaterally. Neuro: Awake, flaccid extremities, lethargic Psych: [place]  LABS:  Recent Labs     04/14/13  0545  04/15/13  0454  WBC  15.2*  10.1  HGB  10.0*  12.7*  HCT  29.7*  40.4  MCV  94.6  94.8  PLT  111*  108*   Recent Labs Lab 04/10/13 0445  04/13/13 0755 04/14/13 0545 04/15/13 0454  NA 140  < > 142 143 143  K 3.7  < > 4.3 4.7 5.5*  CL 105  < > 111 110 109  CO2 18*  < > 22 22 24   BUN 30*  < > 23 20 19   CREATININE 1.91*  < > 1.11 1.03 0.91  CALCIUM 7.9*   < > 8.1* 8.4 8.4  PROT 6.3  --   --   --   --   BILITOT 0.5  --   --   --   --   ALKPHOS 60  --   --   --   --   ALT 12  --   --   --   --   AST 17  --   --   --   --   GLUCOSE 128*  < > 147* 118* 121*  < > = values in this interval not displayed. No results found for this basename: HGBA1C,  in the last 72 hours   TELE: NSR, HR 80s  Radiology/Studies:  Dg Chest Port 1 View  04/14/2013   CLINICAL DATA:  Pneumonia.  EXAM: PORTABLE CHEST - 1 VIEW  COMPARISON:  04/13/2013  FINDINGS: Endotracheal tube is unchanged. Cardiomegaly. Bilateral airspace opacities, left slightly greater than right may reflect asymmetric edema or infection. This is unchanged. More confluent opacity in the left base, atelectasis versus consolidation. Small left pleural effusion, stable.  IMPRESSION: No real change since prior study.   Electronically  Signed   By: Charlett Nose M.D.   On: 04/14/2013 07:18   Dg Chest Port 1 View  04/13/2013   CLINICAL DATA:  Intubated patient.  , history of MI and CVA  EXAM: PORTABLE CHEST - 1 VIEW  COMPARISON:  April 12, 2013.  FINDINGS: The patient is not rotated on this study. The endotracheal tube tip lies at the level of the inferior margin of the clavicular heads approximately 4.7 cm from the crotch of the carina. The cardiac silhouette is enlarged. The central pulmonary vascularity is prominent. The interstitial markings of both lungs are mildly increased but are not greatly changed from yesterday's study. The left hemidiaphragm remains obscured. The observed portions of the bony thorax appear normal.  IMPRESSION: 1. The endotracheal tube is in good position approximately 4.7 cm above the carina. 2. The findings are consistent with low grade CHF with mild interstitial edema. 3. Left lower lobe atelectasis is suspected and unchanged.   Electronically Signed   By: David  [place]   On: 04/13/2013 07:52   Dg Chest Port 1 View  04/12/2013   CLINICAL DATA:  Pneumonia  EXAM: PORTABLE CHEST -  1 VIEW  COMPARISON:  04/11/2013; 04/10/2013; 04/09/2013  FINDINGS: Grossly unchanged enlarged cardiac silhouette given patient rotation. Stable position of support apparatus. No pneumothorax. Worsening bilateral perihilar and medial basilar opacities, left greater than right. Trace left-sided effusion is not excluded. No definite evidence of edema. Grossly unchanged bones.  IMPRESSION: 1.  Stable positioning of support apparatus.  No pneumothorax. 2. Worsening bilateral mid and lower lung heterogeneous opacities, left greater than right, possibly atelectasis and/or asymmetric alveolar pulmonary edema though worrisome for multifocal infection.   Electronically Signed   By: Simonne Come M.D.   On: 04/12/2013 07:31   Dg Chest Port 1 View  04/11/2013   CLINICAL DATA:  Postprocedural evaluation  EXAM: PORTABLE CHEST - 1 VIEW  COMPARISON:  04/10/2013  FINDINGS: Endotracheal tube is appreciated with tip just below the level clavicles. Dextroscoliotic curvature is identified within the thoracic spine. Cardiac silhouette is enlarged. The areas of multifocal increased density appears stable with technique project into consideration. Residual areas of density projects in the left perihilar and right upper lobe regions.  IMPRESSION: Stable multifocal areas of pulmonary densities with thickening taking into consideration.  Endotracheal tube which appears to be adequately positioned.   Electronically Signed   By: Salome Holmes M.D.   On: 04/11/2013 08:48   Portable Chest Xray In Am  04/10/2013   CLINICAL DATA:  Endotracheal tube position.  EXAM: PORTABLE CHEST - 1 VIEW  COMPARISON:  April 09, 2013.  FINDINGS: Severe dextroscoliosis of upper thoracic spine is noted. Stable cardiomediastinal silhouette. No pleural effusion or pneumothorax is noted. Endotracheal tube is in grossly good position with distal tip approximately 7 cm above the Carina. Nasogastric tube has been removed. Left lung is clear. Improved right upper  lobe opacity is noted, although a residual density remains consistent with pneumonia.  IMPRESSION: Endotracheal tube in grossly good position. Improved right lung opacity is noted, although a residual density remains concerning for pneumonia.   Electronically Signed   By: Roque Lias M.D.   On: 04/10/2013 08:44   Dg Chest Portable 1 View  04/09/2013   CLINICAL DATA:  72 year old male with shortness of Breath status post intubation. Initial encounter.  EXAM: PORTABLE CHEST - 1 VIEW  COMPARISON:  None.  FINDINGS: Portable AP supine view at 0727 hrs. Intubated. Endotracheal tube tip in good  position just below the clavicles. Enteric tube courses to the abdomen, tip not included.  Scoliosis. There is cardiomegaly. Other mediastinal contours are within normal limits.  Nodular and confluent opacity throughout the right lung, and also suspected at the left lung base. Left lung base partially obscured by pacer/resuscitation pads. No pneumothorax or definite effusion.  IMPRESSION: 1. Endotracheal tube in good position. Enteric tube courses to the abdomen, tip not included.  2. Multilobar right lung airspace disease. Involvement also suspected at the left lung base. Top considerations are large volume aspiration versus multilobar pneumonia.   Electronically Signed   By: Augusto Gamble M.D.   On: 04/09/2013 07:40    Current Medications:  . amiodarone  400 mg Per Tube Daily  . antiseptic oral rinse  15 mL Mouth Rinse QID  . aspirin  81 mg Per J Tube Daily  . atorvastatin  40 mg Per Tube q1800  . azithromycin  500 mg Intravenous Q24H  . cefTRIAXone (ROCEPHIN)  IV  2 g Intravenous Q24H  . chlorhexidine  15 mL Mouth Rinse BID  . insulin aspart  0-15 Units Subcutaneous Q4H  . lisinopril  10 mg Oral Daily  . metoprolol tartrate  25 mg Per Tube Q6H  . oxybutynin  5 mg Per Tube Daily  . pantoprazole sodium  40 mg Per Tube Daily    ASSESSMENT AND PLAN:  72yo male with PMH of multiple CVAs and chronic aphasia  presented with acute respiratory failure associated with atrial fibrillation a rapid ventricular response, was found to have PNA and diffuse ST elevation more pronounced in the inferior lateral leads on EKG, and in the ED the patient decompensated and required intubation and cardioversion x1 2/2 sustained VT, was started on an amiodarone drip and was transferred to the cath lab for [facility].   1. STEMI- no intervention preformed on 95% nondominant RCA. Trop-I peaked to 0.63. Inferolateral ST elevations persistent on 11/23 tracing. Medical management. No further VT.  --  Continue ASA, ACEi, BB, statin->upgrade to high potency, NTG SL PRN  2. Atrial fibrillation with RVR- maintaining NSR on metoprolol and amiodarone per tube. Currently heparinized.  -- Continue heparin until post-procedures (trach tentatively planned per CCM), then transition to Coumadin -- Continue amiodarone, BB  3. Acute respiratory failure- multifactorial due to acute combined CHF, possible aspiration vs CAP. BS Abx started by CCM. Day 7. Euvolemic on exam. Plan is for tracheostomy, tentatively scheduled.  -- CCM following  4. Hypertension- SBP in 170s early this AM.   -- Increase lisinopril to 20mg , CCB or hydralazine may have greater effect in this African American male  -- Continue metoprolol, hydralazine -- Would benefit from qhs antihypertensives  5. 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 Euvolemic on exam.  -- Continue ACEi, hydralazine, consider long acting nitrates -- Monitor daily weights, strict I/Os -- Lasix PRN  6. Hyperkalemia- supplementation on hold.   7. Acute renal insufficiency- resolved.    Signed, R. Hurman Horn, PA-C 04/15/2013, 8:17 AM Patient seen. Exam reveals alert, in no distress. BP is still running high. Rhythm is maintaining NSR. Chest reveals coarse rhonchi. Heart RSR no gallop. Abdomen nontender. PEG tube in place.  Plan:  Agree with above assessment.  Will add transdermal nitrates.

## 2013-04-16 ENCOUNTER — Inpatient Hospital Stay (HOSPITAL_COMMUNITY): Payer: Medicare Other

## 2013-04-16 DIAGNOSIS — R222 Localized swelling, mass and lump, trunk: Secondary | ICD-10-CM

## 2013-04-16 LAB — BLOOD GAS, ARTERIAL
Acid-Base Excess: 4.6 mmol/L — ABNORMAL HIGH (ref 0.0–2.0)
Bicarbonate: 28.6 mEq/L — ABNORMAL HIGH (ref 20.0–24.0)
FIO2: 0.4 %
O2 Saturation: 99.7 %
PEEP: 5 cmH2O
Patient temperature: 98.6
pH, Arterial: 7.444 (ref 7.350–7.450)
pO2, Arterial: 161 mmHg — ABNORMAL HIGH (ref 80.0–100.0)

## 2013-04-16 LAB — BASIC METABOLIC PANEL
BUN: 19 mg/dL (ref 6–23)
CO2: 27 mEq/L (ref 19–32)
Chloride: 108 mEq/L (ref 96–112)
Creatinine, Ser: 0.95 mg/dL (ref 0.50–1.35)
GFR calc Af Amer: >90 mL/min (ref >90)
Potassium: 4 mEq/L (ref 3.5–5.1)

## 2013-04-16 LAB — MAGNESIUM: Magnesium: 1.9 mg/dL (ref 1.5–2.5)

## 2013-04-16 LAB — CULTURE, BLOOD (ROUTINE X 2)

## 2013-04-16 LAB — CBC
HCT: 26.7 % — ABNORMAL LOW (ref 39.0–52.0)
MCV: 95.4 fL (ref 78.0–100.0)
RBC: 2.8 MIL/uL — ABNORMAL LOW (ref 4.22–5.81)
WBC: 12.2 10*3/uL — ABNORMAL HIGH (ref 4.0–10.5)

## 2013-04-16 LAB — GLUCOSE, CAPILLARY: Glucose-Capillary: 123 mg/dL — ABNORMAL HIGH (ref 70–99)

## 2013-04-16 LAB — PHOSPHORUS: Phosphorus: 3 mg/dL (ref 2.3–4.6)

## 2013-04-16 LAB — PROTIME-INR: INR: 1.31 (ref 0.00–1.49)

## 2013-04-16 MED ORDER — METOPROLOL TARTRATE 25 MG/10 ML ORAL SUSPENSION
50.0000 mg | Freq: Two times a day (BID) | ORAL | Status: DC
Start: 2013-04-16 — End: 2013-04-17
  Administered 2013-04-16: 50 mg
  Filled 2013-04-16 (×3): qty 20

## 2013-04-16 NOTE — Progress Notes (Signed)
Deterding, MD paged regarding runs of v.tach, and frequent multifocal PVCs. Waiting on BMET results to process. Will continue to monitor.

## 2013-04-16 NOTE — Progress Notes (Signed)
ANTICOAGULATION CONSULT NOTE - Follow Up Consult  Pharmacy Consult for heparin Indication: Afib  Labs:  Recent Labs  04/13/13 0755  04/14/13 0545 04/15/13 0454 04/16/13 0400  HGB 9.6*  --  10.0* 12.7* 9.0*  HCT 28.6*  --  29.7* 40.4 26.7*  PLT 90*  --  111* 108* 155  LABPROT  --   --  14.4 15.5* 16.0*  INR  --   --  1.14 1.26 1.31  HEPARINUNFRC  --   < > 0.37 0.30 0.22*  CREATININE 1.11  --  1.03 0.91  --   < > = values in this interval not displayed.   Assessment: 72 yo male with Afib, awaiting trach Monday, for heparin    Goal of Therapy:  Heparin level 0.3-0.7 units/ml Monitor platelets per protocol: Yes   Plan:  Increase Heparin 1850 units/hr Check heparin level in 6 hours.  Geannie Risen, PharmD, BCPS

## 2013-04-16 NOTE — Consult Note (Signed)
Urology Consult  Referring physician: Dr. Cyril Mourning Reason for referral:  Foley disposition  History of Present Illness:  I  was called in consultation by Dr. Vassie Loll to see Mr Buice in follow-up consultation. He was seen by Dr. Annabell Howells 04/12/13.      He is a 72 yo BM, ventilator dependent following an aspiration event. 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   He has a hx of catheter erosion, 2ndary chronic foley for many years and developed the erosion during that period-probably because catheter was not coiled up on his abdominal wall.   Past Medical History  Diagnosis Date  . CVA (cerebral infarction)     a. 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 b. Residual aphasia and torticollis. Does not ambulate - uses a motorized chair.  Marland Kitchen TIA (transient ischemic attack)     a. 2013 around time of pneumonia - x3  . Pneumonia   . History of jejunostomy tube placement     a. Per wife due to strokes.  . Irregular heartbeat     a. Per wife. No hx of blood thinners or complications from blood thinners.  . Enlarged heart     a. Per wife.  Marland Kitchen HTN (hypertension)   . Pulmonary mass     a. Wife states has been followed with serial cxrs.   History reviewed. No pertinent past surgical history.  Medications: I have reviewed the patient's current medications.  Allergies: No Known Allergies  Family History  Problem Relation Age of Onset  . Stroke Father     Social History:  reports that he has never smoked. He does not have any smokeless tobacco history on file. He reports that he does not drink alcohol or use illicit drugs.  ROS  Physical Exam:  Vital signs in last 24 hours: Temp:  [98.7 F (37.1 C)-100.3 F (37.9 C)] 98.7 F (37.1 C) (11/28 1200) Pulse Rate:  [77-99] 85 (11/28 1538) Resp:  [14-28] 17 (11/28 1538) BP: (116-182)/(59-87) 159/59 mmHg (11/28 1209) SpO2:  [99 %-100 %] 100 % (11/28 1538) FiO2 (%):  [30 %-40 %] 30 % (11/28 1538) Weight:  [73.1 kg (161 lb 2.5 oz)] 73.1 kg  (161 lb 2.5 oz) (11/28 0500) Physical Exam: GU: GU: Uncircumcised phallus with acquired hypospadius to the mid penile shaft which appears chronic and non-infected. There is a foley indwelling. The scrotum is unremarkable.    Laboratory Data:  Results for orders placed during the hospital encounter of 04/09/13 (from the past 72 hour(s))  GLUCOSE, CAPILLARY     Status: Abnormal   Collection Time    04/13/13  8:06 PM      Result Value Range   Glucose-Capillary 114 (*) 70 - 99 mg/dL  GLUCOSE, CAPILLARY     Status: Abnormal   Collection Time    04/14/13 12:22 AM      Result Value Range   Glucose-Capillary 115 (*) 70 - 99 mg/dL  GLUCOSE, CAPILLARY     Status: Abnormal   Collection Time    04/14/13  4:50 AM      Result Value Range   Glucose-Capillary 102 (*) 70 - 99 mg/dL  HEPARIN LEVEL (UNFRACTIONATED)     Status: None   Collection Time    04/14/13  5:45 AM      Result Value Range   Heparin Unfractionated 0.37  0.30 - 0.70 IU/mL   Comment:            IF  HEPARIN RESULTS ARE BELOW     EXPECTED VALUES, AND PATIENT     DOSAGE HAS BEEN CONFIRMED,     SUGGEST FOLLOW UP TESTING     OF ANTITHROMBIN III LEVELS.  BASIC METABOLIC PANEL     Status: Abnormal   Collection Time    04/14/13  5:45 AM      Result Value Range   Sodium 143  135 - 145 mEq/L   Potassium 4.7  3.5 - 5.1 mEq/L   Chloride 110  96 - 112 mEq/L   CO2 22  19 - 32 mEq/L   Glucose, Bld 118 (*) 70 - 99 mg/dL   BUN 20  6 - 23 mg/dL   Creatinine, Ser 1.61  0.50 - 1.35 mg/dL   Calcium 8.4  8.4 - 09.6 mg/dL   GFR calc non Af Amer 71 (*) >90 mL/min   GFR calc Af Amer 82 (*) >90 mL/min   Comment: (NOTE)     The eGFR has been calculated using the CKD EPI equation.     This calculation has not been validated in all clinical situations.     eGFR's persistently <90 mL/min signify possible Chronic Kidney     Disease.  CBC     Status: Abnormal   Collection Time    04/14/13  5:45 AM      Result Value Range   WBC 15.2 (*) 4.0 -  10.5 K/uL   RBC 3.14 (*) 4.22 - 5.81 MIL/uL   Hemoglobin 10.0 (*) 13.0 - 17.0 g/dL   HCT 04.5 (*) 40.9 - 81.1 %   MCV 94.6  78.0 - 100.0 fL   MCH 31.8  26.0 - 34.0 pg   MCHC 33.7  30.0 - 36.0 g/dL   RDW 91.4 (*) 78.2 - 95.6 %   Platelets 111 (*) 150 - 400 K/uL   Comment: CONSISTENT WITH PREVIOUS RESULT  PROTIME-INR     Status: None   Collection Time    04/14/13  5:45 AM      Result Value Range   Prothrombin Time 14.4  11.6 - 15.2 seconds   INR 1.14  0.00 - 1.49  GLUCOSE, CAPILLARY     Status: Abnormal   Collection Time    04/14/13  7:24 AM      Result Value Range   Glucose-Capillary 107 (*) 70 - 99 mg/dL  GLUCOSE, CAPILLARY     Status: Abnormal   Collection Time    04/14/13 11:53 AM      Result Value Range   Glucose-Capillary 113 (*) 70 - 99 mg/dL  GLUCOSE, CAPILLARY     Status: Abnormal   Collection Time    04/14/13  5:47 PM      Result Value Range   Glucose-Capillary 114 (*) 70 - 99 mg/dL  GLUCOSE, CAPILLARY     Status: Abnormal   Collection Time    04/14/13  8:20 PM      Result Value Range   Glucose-Capillary 109 (*) 70 - 99 mg/dL  GLUCOSE, CAPILLARY     Status: Abnormal   Collection Time    04/15/13 12:41 AM      Result Value Range   Glucose-Capillary 124 (*) 70 - 99 mg/dL  GLUCOSE, CAPILLARY     Status: Abnormal   Collection Time    04/15/13  4:21 AM      Result Value Range   Glucose-Capillary 100 (*) 70 - 99 mg/dL  HEPARIN LEVEL (UNFRACTIONATED)     Status: None  Collection Time    04/15/13  4:54 AM      Result Value Range   Heparin Unfractionated 0.30  0.30 - 0.70 IU/mL   Comment:            IF HEPARIN RESULTS ARE BELOW     EXPECTED VALUES, AND PATIENT     DOSAGE HAS BEEN CONFIRMED,     SUGGEST FOLLOW UP TESTING     OF ANTITHROMBIN III LEVELS.  PROTIME-INR     Status: Abnormal   Collection Time    04/15/13  4:54 AM      Result Value Range   Prothrombin Time 15.5 (*) 11.6 - 15.2 seconds   INR 1.26  0.00 - 1.49  CBC     Status: Abnormal    Collection Time    04/15/13  4:54 AM      Result Value Range   WBC 10.1  4.0 - 10.5 K/uL   RBC 4.26  4.22 - 5.81 MIL/uL   Hemoglobin 12.7 (*) 13.0 - 17.0 g/dL   Comment: REPEATED TO VERIFY   HCT 40.4  39.0 - 52.0 %   MCV 94.8  78.0 - 100.0 fL   MCH 29.8  26.0 - 34.0 pg   MCHC 31.4  30.0 - 36.0 g/dL   RDW 16.1 (*) 09.6 - 04.5 %   Platelets 108 (*) 150 - 400 K/uL   Comment: PLATELET COUNT CONFIRMED BY SMEAR     REPEATED TO VERIFY  BASIC METABOLIC PANEL     Status: Abnormal   Collection Time    04/15/13  4:54 AM      Result Value Range   Sodium 143  135 - 145 mEq/L   Potassium 5.5 (*) 3.5 - 5.1 mEq/L   Chloride 109  96 - 112 mEq/L   CO2 24  19 - 32 mEq/L   Glucose, Bld 121 (*) 70 - 99 mg/dL   BUN 19  6 - 23 mg/dL   Creatinine, Ser 4.09  0.50 - 1.35 mg/dL   Calcium 8.4  8.4 - 81.1 mg/dL   GFR calc non Af Amer 83 (*) >90 mL/min   GFR calc Af Amer >90  >90 mL/min   Comment: (NOTE)     The eGFR has been calculated using the CKD EPI equation.     This calculation has not been validated in all clinical situations.     eGFR's persistently <90 mL/min signify possible Chronic Kidney     Disease.  GLUCOSE, CAPILLARY     Status: Abnormal   Collection Time    04/15/13  8:50 AM      Result Value Range   Glucose-Capillary 114 (*) 70 - 99 mg/dL  HEPARIN LEVEL (UNFRACTIONATED)     Status: Abnormal   Collection Time    04/16/13  4:00 AM      Result Value Range   Heparin Unfractionated 0.22 (*) 0.30 - 0.70 IU/mL   Comment:            IF HEPARIN RESULTS ARE BELOW     EXPECTED VALUES, AND PATIENT     DOSAGE HAS BEEN CONFIRMED,     SUGGEST FOLLOW UP TESTING     OF ANTITHROMBIN III LEVELS.  PROTIME-INR     Status: Abnormal   Collection Time    04/16/13  4:00 AM      Result Value Range   Prothrombin Time 16.0 (*) 11.6 - 15.2 seconds   INR 1.31  0.00 - 1.49  CBC  Status: Abnormal   Collection Time    04/16/13  4:00 AM      Result Value Range   WBC 12.2 (*) 4.0 - 10.5 K/uL   RBC  2.80 (*) 4.22 - 5.81 MIL/uL   Hemoglobin 9.0 (*) 13.0 - 17.0 g/dL   Comment: DELTA CHECK NOTED     REPEATED TO VERIFY   HCT 26.7 (*) 39.0 - 52.0 %   MCV 95.4  78.0 - 100.0 fL   MCH 32.1  26.0 - 34.0 pg   MCHC 33.7  30.0 - 36.0 g/dL   RDW 30.8 (*) 65.7 - 84.6 %   Platelets 155  150 - 400 K/uL   Comment: DELTA CHECK NOTED     REPEATED TO VERIFY  BASIC METABOLIC PANEL     Status: Abnormal   Collection Time    04/16/13  4:00 AM      Result Value Range   Sodium 145  135 - 145 mEq/L   Potassium 4.0  3.5 - 5.1 mEq/L   Comment: DELTA CHECK NOTED   Chloride 108  96 - 112 mEq/L   CO2 27  19 - 32 mEq/L   Glucose, Bld 130 (*) 70 - 99 mg/dL   BUN 19  6 - 23 mg/dL   Creatinine, Ser 9.62  0.50 - 1.35 mg/dL   Calcium 8.6  8.4 - 95.2 mg/dL   GFR calc non Af Amer 82 (*) >90 mL/min   GFR calc Af Amer >90  >90 mL/min   Comment: (NOTE)     The eGFR has been calculated using the CKD EPI equation.     This calculation has not been validated in all clinical situations.     eGFR's persistently <90 mL/min signify possible Chronic Kidney     Disease.  MAGNESIUM     Status: None   Collection Time    04/16/13  4:00 AM      Result Value Range   Magnesium 1.9  1.5 - 2.5 mg/dL  PHOSPHORUS     Status: None   Collection Time    04/16/13  4:00 AM      Result Value Range   Phosphorus 3.0  2.3 - 4.6 mg/dL  BLOOD GAS, ARTERIAL     Status: Abnormal   Collection Time    04/16/13  4:21 AM      Result Value Range   FIO2 0.40     Delivery systems VENTILATOR     Mode PRESSURE REGULATED VOLUME CONTROL     VT 500     Rate 14     Peep/cpap 5.0     pH, Arterial 7.444  7.350 - 7.450   pCO2 arterial 42.4  35.0 - 45.0 mmHg   pO2, Arterial 161.0 (*) 80.0 - 100.0 mmHg   Bicarbonate 28.6 (*) 20.0 - 24.0 mEq/L   TCO2 29.9  0 - 100 mmol/L   Acid-Base Excess 4.6 (*) 0.0 - 2.0 mmol/L   O2 Saturation 99.7     Patient temperature 98.6     Collection site RIGHT RADIAL     Drawn by COLLECTED BY RT     Sample type  ARTERIAL DRAW     Allens test (pass/fail) PASS  PASS  HEPARIN LEVEL (UNFRACTIONATED)     Status: None   Collection Time    04/16/13  2:00 PM      Result Value Range   Heparin Unfractionated 0.44  0.30 - 0.70 IU/mL   Comment:  IF HEPARIN RESULTS ARE BELOW     EXPECTED VALUES, AND PATIENT     DOSAGE HAS BEEN CONFIRMED,     SUGGEST FOLLOW UP TESTING     OF ANTITHROMBIN III LEVELS.   Recent Results (from the past 240 hour(s))  CULTURE, BLOOD (ROUTINE X 2)     Status: None   Collection Time    04/09/13  8:45 AM      Result Value Range Status   Specimen Description BLOOD A-LINE DRAW   Final   Special Requests     Final   Value: BOTTLES DRAWN AEROBIC AND ANAEROBIC 10CC RT FEMORAL ALINE   Culture  Setup Time     Final   Value: 04/09/2013 13:47     Performed at [facility]   Culture     Final   Value: NO GROWTH 5 DAYS     Performed at [facility]   Report Status 04/15/2013 FINAL   Final  CULTURE, BLOOD (ROUTINE X 2)     Status: None   Collection Time    04/09/13  9:00 AM      Result Value Range Status   Specimen Description BLOOD A-LINE DRAW   Final   Special Requests     Final   Value: BOTTLES DRAWN AEROBIC AND ANAEROBIC 10CC RT FEMORAL ALINE   Culture  Setup Time     Final   Value: 04/09/2013 13:47     Performed at [facility]   Culture     Final   Value: NO GROWTH 5 DAYS     Performed at [facility]   Report Status 04/15/2013 FINAL   Final  MRSA PCR SCREENING     Status: Abnormal   Collection Time    04/09/13  9:06 AM      Result Value Range Status   MRSA by PCR POSITIVE (*) NEGATIVE Final   Comment:            The GeneXpert MRSA Assay (FDA     approved for NASAL specimens     only), is one component of a     comprehensive MRSA colonization     surveillance program. It is not     intended to diagnose MRSA     infection nor to guide or     monitor treatment for     MRSA infections.     RESULT CALLED TO, READ BACK BY AND  VERIFIED WITH:     J BRODY,RN 04/09/13 1150 BY K SCHULTZ  CULTURE, BLOOD (ROUTINE X 2)     Status: None   Collection Time    04/09/13  7:52 PM      Result Value Range Status   Specimen Description BLOOD RIGHT ARM   Final   Special Requests BOTTLES DRAWN AEROBIC ONLY 5.5CC   Final   Culture  Setup Time     Final   Value: 04/10/2013 03:07     Performed at [facility]   Culture     Final   Value: NO GROWTH 5 DAYS     Performed at [facility]   Report Status 04/16/2013 FINAL   Final  CULTURE, RESPIRATORY (NON-EXPECTORATED)     Status: None   Collection Time    04/10/13  8:00 AM      Result Value Range Status   Specimen Description TRACHEAL ASPIRATE   Final   Special Requests Normal   Final   Gram Stain  Final   Value: NO WBC SEEN     RARE SQUAMOUS EPITHELIAL CELLS PRESENT     NO ORGANISMS SEEN     Performed at [facility]   Culture     Final   Value: Non-Pathogenic Oropharyngeal-type Flora Isolated.     Performed at [facility]   Report Status 04/12/2013 FINAL   Final   Creatinine:  Recent Labs  04/10/13 0445 04/11/13 0500 04/12/13 0250 04/13/13 0755 04/14/13 0545 04/15/13 0454 04/16/13 0400  CREATININE 1.91* 1.81* 1.30 1.11 1.03 0.91 0.95   Baseline Creatinine:   Impression/Assessment:    Agree with Dr. Annabell Howells that the patient has chronic urethral erosive hypospadius. He should have foley taped to his abdominal wall to take pressure off his bulbar urethra. I will notify Dr. Annabell Howells of your concerns and interest in possible s-p tube placement.   Plan:    Continue foley catheter for now. Will notify Dr. Annabell Howells after the Holiday weekend of possible suprapubic catheter placement.   Pedro Oldenburg I 04/16/2013, 5:08 PM

## 2013-04-16 NOTE — Progress Notes (Signed)
ANTICOAGULATION CONSULT NOTE - Follow Up Consult  Pharmacy Consult for heparin Indication: Afib  Labs:  Recent Labs  04/14/13 0545 04/15/13 0454 04/16/13 0400 04/16/13 1400  HGB 10.0* 12.7* 9.0*  --   HCT 29.7* 40.4 26.7*  --   PLT 111* 108* 155  --   LABPROT 14.4 15.5* 16.0*  --   INR 1.14 1.26 1.31  --   HEPARINUNFRC 0.37 0.30 0.22* 0.44  CREATININE 1.03 0.91 0.95  --      Assessment: 72 yo male with Afib, awaiting trach Monday 04/19/13. Continues on IV heparin infusion.  Heparin level is back in therapeutic range after rate increased to 1850 units/hr this AM.  Hgb = 9.0 and PLTC  =155K. No bleeding noted.  On Amiodarone 400 mg per tube once daily.  Goal of Therapy:  Heparin level 0.3-0.7 units/ml Monitor platelets per protocol: Yes   Plan:  Continue Heparin 1850 units/hr. Check daily heparin level and CBC.   Noah Delaine, RPh Clinical Pharmacist Pager: [phone] 04/16/2013, 2:54 PM

## 2013-04-16 NOTE — Progress Notes (Signed)
Patient Name: [name] [name] Date of Encounter: 04/16/2013     Principal Problem:   ST elevation myocardial infarction (STEMI) of inferolateral wall Active Problems:   CAD (coronary artery disease)   History of CVA (cerebrovascular accident)   History of TIA (transient ischemic attack)   VT (ventricular tachycardia)   Atrial fibrillation with RVR   Pulmonary mass   Acute respiratory failure   Aspiration pneumonia    SUBJECTIVE: Intubated, awake, watching TV. Denies chest pain or dyspnea.   OBJECTIVE  Filed Vitals:   04/16/13 0400 04/16/13 0500 04/16/13 0700 04/16/13 0713  BP: 151/73  175/76 175/76  Pulse: 89 99 86 87  Temp: 100.1 F (37.8 C)     TempSrc: Axillary     Resp: 19 28 17 18   Height:      Weight:  161 lb 2.5 oz (73.1 kg)    SpO2: 100% 100% 100% 100%    Intake/Output Summary (Last 24 hours) at 04/16/13 0743 Last data filed at 04/16/13 0700  Gross per 24 hour  Intake 2864.43 ml  Output   1230 ml  Net 1634.43 ml   Weight change: 3 lb 1.4 oz (1.4 kg)  PHYSICAL EXAM  General: Intubated, awake, in no acute distress. Head: Normocephalic, atraumatic, sclera non-icteric, no xanthomas, nares are without discharge, ETT appreciated  Neck: Supple without bruits or JVD. Lungs:  Respirations assisted by ventilator. No wheezing, scattered rhonchi. No rales.  Heart: RRR no s3, s4, or murmurs. Abdomen: Soft, non-tender, non-distended, BS + x 4.  Msk:  Strength and tone appears decreased for age. Extremities: No clubbing, cyanosis or edema. DP/PT/Radials 2+ and equal bilaterally. Neuro: Awake, flaccid extremities, lethargic Psych: [place]  LABS:  Recent Labs     04/15/13  0454  04/16/13  0400  WBC  10.1  12.2*  HGB  12.7*  9.0*  HCT  40.4  26.7*  MCV  94.8  95.4  PLT  108*  155   Recent Labs Lab 04/10/13 0445  04/14/13 0545 04/15/13 0454 04/16/13 0400  NA 140  < > 143 143 145  K 3.7  < > 4.7 5.5* 4.0  CL 105  < > 110 109 108  CO2 18*  < > 22  24 27   BUN 30*  < > 20 19 19   CREATININE 1.91*  < > 1.03 0.91 0.95  CALCIUM 7.9*  < > 8.4 8.4 8.6  PROT 6.3  --   --   --   --   BILITOT 0.5  --   --   --   --   ALKPHOS 60  --   --   --   --   ALT 12  --   --   --   --   AST 17  --   --   --   --   GLUCOSE 128*  < > 118* 121* 130*  < > = values in this interval not displayed. No results found for this basename: HGBA1C,  in the last 72 hours   TELE: NSR, frequent PVCs  Radiology/Studies:  Dg Chest Port 1 View  04/14/2013   CLINICAL DATA:  Pneumonia.  EXAM: PORTABLE CHEST - 1 VIEW  COMPARISON:  04/13/2013  FINDINGS: Endotracheal tube is unchanged. Cardiomegaly. Bilateral airspace opacities, left slightly greater than right may reflect asymmetric edema or infection. This is unchanged. More confluent opacity in the left base, atelectasis versus consolidation. Small left pleural effusion, stable.  IMPRESSION: No real change since prior  study.   Electronically Signed   By: Charlett Nose M.D.   On: 04/14/2013 07:18   Dg Chest Port 1 View  04/13/2013   CLINICAL DATA:  Intubated patient.  , history of MI and CVA  EXAM: PORTABLE CHEST - 1 VIEW  COMPARISON:  April 12, 2013.  FINDINGS: The patient is not rotated on this study. The endotracheal tube tip lies at the level of the inferior margin of the clavicular heads approximately 4.7 cm from the crotch of the carina. The cardiac silhouette is enlarged. The central pulmonary vascularity is prominent. The interstitial markings of both lungs are mildly increased but are not greatly changed from yesterday's study. The left hemidiaphragm remains obscured. The observed portions of the bony thorax appear normal.  IMPRESSION: 1. The endotracheal tube is in good position approximately 4.7 cm above the carina. 2. The findings are consistent with low grade CHF with mild interstitial edema. 3. Left lower lobe atelectasis is suspected and unchanged.   Electronically Signed   By: David  [place]   On: 04/13/2013 07:52    Dg Chest Port 1 View  04/12/2013   CLINICAL DATA:  Pneumonia  EXAM: PORTABLE CHEST - 1 VIEW  COMPARISON:  04/11/2013; 04/10/2013; 04/09/2013  FINDINGS: Grossly unchanged enlarged cardiac silhouette given patient rotation. Stable position of support apparatus. No pneumothorax. Worsening bilateral perihilar and medial basilar opacities, left greater than right. Trace left-sided effusion is not excluded. No definite evidence of edema. Grossly unchanged bones.  IMPRESSION: 1.  Stable positioning of support apparatus.  No pneumothorax. 2. Worsening bilateral mid and lower lung heterogeneous opacities, left greater than right, possibly atelectasis and/or asymmetric alveolar pulmonary edema though worrisome for multifocal infection.   Electronically Signed   By: Simonne Come M.D.   On: 04/12/2013 07:31   Dg Chest Port 1 View  04/11/2013   CLINICAL DATA:  Postprocedural evaluation  EXAM: PORTABLE CHEST - 1 VIEW  COMPARISON:  04/10/2013  FINDINGS: Endotracheal tube is appreciated with tip just below the level clavicles. Dextroscoliotic curvature is identified within the thoracic spine. Cardiac silhouette is enlarged. The areas of multifocal increased density appears stable with technique project into consideration. Residual areas of density projects in the left perihilar and right upper lobe regions.  IMPRESSION: Stable multifocal areas of pulmonary densities with thickening taking into consideration.  Endotracheal tube which appears to be adequately positioned.   Electronically Signed   By: Salome Holmes M.D.   On: 04/11/2013 08:48   Portable Chest Xray In Am  04/10/2013   CLINICAL DATA:  Endotracheal tube position.  EXAM: PORTABLE CHEST - 1 VIEW  COMPARISON:  April 09, 2013.  FINDINGS: Severe dextroscoliosis of upper thoracic spine is noted. Stable cardiomediastinal silhouette. No pleural effusion or pneumothorax is noted. Endotracheal tube is in grossly good position with distal tip approximately 7 cm  above the Carina. Nasogastric tube has been removed. Left lung is clear. Improved right upper lobe opacity is noted, although a residual density remains consistent with pneumonia.  IMPRESSION: Endotracheal tube in grossly good position. Improved right lung opacity is noted, although a residual density remains concerning for pneumonia.   Electronically Signed   By: Roque Lias M.D.   On: 04/10/2013 08:44   Dg Chest Portable 1 View  04/09/2013   CLINICAL DATA:  72 year old male with shortness of Breath status post intubation. Initial encounter.  EXAM: PORTABLE CHEST - 1 VIEW  COMPARISON:  None.  FINDINGS: Portable AP supine view at 0727 hrs. Intubated. Endotracheal  tube tip in good position just below the clavicles. Enteric tube courses to the abdomen, tip not included.  Scoliosis. There is cardiomegaly. Other mediastinal contours are within normal limits.  Nodular and confluent opacity throughout the right lung, and also suspected at the left lung base. Left lung base partially obscured by pacer/resuscitation pads. No pneumothorax or definite effusion.  IMPRESSION: 1. Endotracheal tube in good position. Enteric tube courses to the abdomen, tip not included.  2. Multilobar right lung airspace disease. Involvement also suspected at the left lung base. Top considerations are large volume aspiration versus multilobar pneumonia.   Electronically Signed   By: Augusto Gamble M.D.   On: 04/09/2013 07:40    Current Medications:  . amiodarone  400 mg Per Tube Daily  . antiseptic oral rinse  15 mL Mouth Rinse QID  . aspirin  81 mg Per J Tube Daily  . atorvastatin  80 mg Per Tube q1800  . azithromycin  500 mg Intravenous Q24H  . cefTRIAXone (ROCEPHIN)  IV  2 g Intravenous Q24H  . chlorhexidine  15 mL Mouth Rinse BID  . insulin aspart  0-15 Units Subcutaneous Q4H  . lisinopril  20 mg Oral Daily  . metoprolol tartrate  25 mg Per Tube Q6H  . nitroGLYCERIN  1 inch Topical Q6H  . oxybutynin  5 mg Per Tube Daily  .  pantoprazole sodium  40 mg Per Tube Daily    ASSESSMENT AND PLAN:  72yo male with PMH of multiple CVAs and chronic aphasia presented with acute respiratory failure associated with atrial fibrillation a rapid ventricular response, was found to have PNA and diffuse ST elevation more pronounced in the inferior lateral leads on EKG, and in the ED the patient decompensated and required intubation and cardioversion x1 2/2 sustained VT, was started on an amiodarone drip and was transferred to the cath lab for [facility].   1. STEMI- no intervention preformed on 95% nondominant RCA. Trop-I peaked to 0.63. Inferolateral ST elevations persistent on 11/23 tracing. Medical management. No further VT.  --  Continue ASA, ACEi, BB, statin->upgrade to high potency, NTG SL PRN  2. Atrial fibrillation with RVR- maintaining NSR on metoprolol and amiodarone per tube. Currently heparinized.  -- Continue heparin until post-procedures (trach tentatively planned per CCM), then transition to Coumadin -- Continue amiodarone, BB  3. Acute respiratory failure- multifactorial due to acute combined CHF, possible aspiration vs CAP. BS Abx started by CCM. Day 7. Euvolemic on exam. Plan is for tracheostomy, tentatively scheduled.  -- CCM following  4. Hypertension- SBP in 170s early this AM.   -- Increased lisinopril to 20mg ,  -- Continue metoprolol, hydralazine   5. 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 Euvolemic on exam.  -- Continue ACEi, hydralazine, nitrates, -- Monitor daily weights, strict I/Os -- Lasix PRN  6. Hyperkalemia- supplementation on hold.   7. Acute renal insufficiency- resolved.     04/16/2013, 7:43 AM

## 2013-04-16 NOTE — Progress Notes (Addendum)
PULMONARY  / CRITICAL CARE MEDICINE  Name: [name] [name] MRN: [id number] DOB: December 30, 1940    ADMISSION DATE:  04/09/2013 CONSULTATION DATE:  04/09/2013  REFERRING MD :  Cardiology PRIMARY SERVICE: PCCM  CHIEF COMPLAINT:  Acute respiratory failure  BRIEF PATIENT DESCRIPTION: 72 yo with history of multiple CVA (aphasia, PEG) admitted with respiratory failure in setting of pneumonia and STEMI.  SIGNIFICANT EVENTS / STUDIES:  11/21  Admitted with respiratory failure in setting of pneumonia and STEMI 11/21  Cardiac cath >>> 95% occluded RCA, no intervention due to non dominant nature 11/22  TTE >>> EF 45-50%, abnormal relaxation, mild aortic regurgitation  LINES / TUBES: Foley 11/21 >>> PEG ??? >>> R fem CVL 11/21 >>>out R fem A-Line 11/21 >>> out  CULTURES: 11/21 Blood >>> Neg 11/22 Sputum >>> WBCs, no organisms  ANTIBIOTICS: Azithromycin 11/21 >>>11/28 Ceftriaxone 11/21 >>>  SUBJECTIVE:   Low gr fevers No wean last 24h  VITAL SIGNS: Temp:  [99 F (37.2 C)-100.3 F (37.9 C)] 100.1 F (37.8 C) (11/28 0400) Pulse Rate:  [77-99] 87 (11/28 0713) Resp:  [14-28] 18 (11/28 0713) BP: (116-193)/(61-104) 175/76 mmHg (11/28 0713) SpO2:  [100 %] 100 % (11/28 0713) FiO2 (%):  [40 %] 40 % (11/28 0713) Weight:  [73.1 kg (161 lb 2.5 oz)] 73.1 kg (161 lb 2.5 oz) (11/28 0500)  HEMODYNAMICS:   VENTILATOR SETTINGS: Vent Mode:  [-] PRVC FiO2 (%):  [40 %] 40 % Set Rate:  [14 bmp] 14 bmp Vt Set:  [500 mL] 500 mL PEEP:  [5 cmH20] 5 cmH20 Plateau Pressure:  [17 cmH20-30 cmH20] 17 cmH20  INTAKE / OUTPUT: Intake/Output     11/27 0701 - 11/28 0700 11/28 0701 - 11/29 0700   I.V. (mL/kg) 644.4 (8.8)    Other 360    NG/GT 1560    IV Piggyback 300    Total Intake(mL/kg) 2864.4 (39.2)    Urine (mL/kg/hr) 1230 (0.7)    Total Output 1230     Net +1634.4            PHYSICAL EXAMINATION: General:  Awake and intubated, NAD Neuro:  Awake, alert, nonverbal due to intubation, follows  commands HEENT:  OETT, MM moist and pink Cardiovascular:  Irregular, no murmurs Lungs:  Bilateral air entry, rhonchi R>L Abdomen:  Soft, PEG site intact, bowel sounds present Musculoskeletal:  Torticollis, contractures, bilateral foot drop Skin:  Penile erosion, skin thin and shiny  LABS:  CBC  Recent Labs Lab 04/14/13 0545 04/15/13 0454 04/16/13 0400  WBC 15.2* 10.1 12.2*  HGB 10.0* 12.7* 9.0*  HCT 29.7* 40.4 26.7*  PLT 111* 108* 155   Coag's  Recent Labs Lab 04/14/13 0545 04/15/13 0454 04/16/13 0400  INR 1.14 1.26 1.31   BMET  Recent Labs Lab 04/14/13 0545 04/15/13 0454 04/16/13 0400  NA 143 143 145  K 4.7 5.5* 4.0  CL 110 109 108  CO2 22 24 27   BUN 20 19 19   CREATININE 1.03 0.91 0.95  GLUCOSE 118* 121* 130*   Electrolytes  Recent Labs Lab 04/09/13 1000  04/14/13 0545 04/15/13 0454 04/16/13 0400  CALCIUM  --   < > 8.4 8.4 8.6  MG 1.6  --   --   --  1.9  PHOS  --   --   --   --  3.0  < > = values in this interval not displayed. Sepsis Markers  Recent Labs Lab 04/10/13 0914 04/11/13 0500 04/12/13 0250  PROCALCITON 113.10 92.17 39.22  ABG  Recent Labs Lab 04/09/13 0929 04/09/13 1210 04/16/13 0421  PHART 7.325* 7.369 7.444  PCO2ART 43.2 33.2* 42.4  PO2ART 101.0* 64.0* 161.0*   Liver Enzymes  Recent Labs Lab 04/10/13 0445  AST 17  ALT 12  ALKPHOS 60  BILITOT 0.5  ALBUMIN 2.5*   Cardiac Enzymes  Recent Labs Lab 04/09/13 1517  TROPONINI 0.63*   Glucose  Recent Labs Lab 04/14/13 1153 04/14/13 1747 04/14/13 2020 04/15/13 0041 04/15/13 0421 04/15/13 0850  GLUCAP 113* 114* 109* 124* 100* 114*    CXR:  11/28 >>> Cardiomegaly. Bilateral airspace opacities, left slightly greater than right may reflect asymmetric  edema or infection.    ASSESSMENT / PLAN:  PULMONARY A:  Acute respiratory failure in setting of pneumonia (CAP vs aspiration) - improving.   Possibly element of pulmonary edema in setting of  VT. Pulmonary nodule RUL P:   -begin PS trials. - Wife wants trach, would likely be an OR trach, will call ENT - VAP bundle. -CT chest once acute issues resolved to clarify nodule   CARDIOVASCULAR A:  VT resolved with defibrillation.  STEMI s/p cardiac cath without intervention.  H/o CAD, HTN, AF. 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 P:  - - Cardiology following. - Amiodarone. - Heparin gtt. - ASA, Plavix, Lipitor, Metoprolol.  RENAL A:  AKI / oliguria - improving   Hypokalemia. Acquired hypospadius, chronic P:   - - Appreciate Dr Belva Crome eval; hypospadius does not appear infected, please call urology on Friday to address urinary catheter.  GASTROINTESTINAL A:  Dysphagia s/p PEG. P:   - TF per Nutritionist - Protonix for GI Px  HEMATOLOGIC A:   Hemodilution.   Therapeutic anticoagulation for ACS- NOT chronically anticoagulated for AF (reportedly). P:  - Trend CBC - Heparin gtt  INFECTIOUS A:  Pneumonia (aspiration vs CAP), PCT 92. P:   - Abx / cx as above, ct x 10ds until 11/30 - Monitor fever curve and WBCs  ENDOCRINE A:  Hyperglycemia. P:   - SSI  NEUROLOGIC A:   Encephalopathy.  H/o multiple CVAs with significant residual deficit. Contractures P:   - Goal RASS 0 to -1 - Fentanyl PRN - Foot drop boots  Today's summary:  Will need trach  Care during the described time interval was provided by me and/or other providers on the critical care team.  I have reviewed this patient's available data, including medical history, events of note, physical examination and test results as part of my evaluation  CC time x  13m  Cyril Mourning MD. Tonny Bollman. [facility] Pager [phone] If no response call [phone]

## 2013-04-16 NOTE — Consult Note (Signed)
ENT CONSULT:  Reason for Consult: Vent dependence Referring Physician: CCM  [name] [name] is an 72 y.o. male.  HPI: Pt with pneumonia and MI, will need vent support  Past Medical History  Diagnosis Date  . CVA (cerebral infarction)     a. 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 b. Residual aphasia and torticollis. Does not ambulate - uses a motorized chair.  Marland Kitchen TIA (transient ischemic attack)     a. 2013 around time of pneumonia - x3  . Pneumonia   . History of jejunostomy tube placement     a. Per wife due to strokes.  . Irregular heartbeat     a. Per wife. No hx of blood thinners or complications from blood thinners.  . Enlarged heart     a. Per wife.  Marland Kitchen HTN (hypertension)   . Pulmonary mass     a. Wife states has been followed with serial cxrs.    History reviewed. No pertinent past surgical history.  Family History  Problem Relation Age of Onset  . Stroke Father     Social History:  reports that he has never smoked. He does not have any smokeless tobacco history on file. He reports that he does not drink alcohol or use illicit drugs.  Allergies: No Known Allergies  Medications: I have reviewed the patient's current medications.  Results for orders placed during the hospital encounter of 04/09/13 (from the past 48 hour(s))  GLUCOSE, CAPILLARY     Status: Abnormal   Collection Time    04/14/13 11:53 AM      Result Value Range   Glucose-Capillary 113 (*) 70 - 99 mg/dL  GLUCOSE, CAPILLARY     Status: Abnormal   Collection Time    04/14/13  5:47 PM      Result Value Range   Glucose-Capillary 114 (*) 70 - 99 mg/dL  GLUCOSE, CAPILLARY     Status: Abnormal   Collection Time    04/14/13  8:20 PM      Result Value Range   Glucose-Capillary 109 (*) 70 - 99 mg/dL  GLUCOSE, CAPILLARY     Status: Abnormal   Collection Time    04/15/13 12:41 AM      Result Value Range   Glucose-Capillary 124 (*) 70 - 99 mg/dL  GLUCOSE, CAPILLARY     Status: Abnormal   Collection Time    04/15/13  4:21 AM      Result Value Range   Glucose-Capillary 100 (*) 70 - 99 mg/dL  HEPARIN LEVEL (UNFRACTIONATED)     Status: None   Collection Time    04/15/13  4:54 AM      Result Value Range   Heparin Unfractionated 0.30  0.30 - 0.70 IU/mL   Comment:            IF HEPARIN RESULTS ARE BELOW     EXPECTED VALUES, AND PATIENT     DOSAGE HAS BEEN CONFIRMED,     SUGGEST FOLLOW UP TESTING     OF ANTITHROMBIN III LEVELS.  PROTIME-INR     Status: Abnormal   Collection Time    04/15/13  4:54 AM      Result Value Range   Prothrombin Time 15.5 (*) 11.6 - 15.2 seconds   INR 1.26  0.00 - 1.49  CBC     Status: Abnormal   Collection Time    04/15/13  4:54 AM      Result Value Range   WBC 10.1  4.0 -  10.5 K/uL   RBC 4.26  4.22 - 5.81 MIL/uL   Hemoglobin 12.7 (*) 13.0 - 17.0 g/dL   Comment: REPEATED TO VERIFY   HCT 40.4  39.0 - 52.0 %   MCV 94.8  78.0 - 100.0 fL   MCH 29.8  26.0 - 34.0 pg   MCHC 31.4  30.0 - 36.0 g/dL   RDW 62.1 (*) 30.8 - 65.7 %   Platelets 108 (*) 150 - 400 K/uL   Comment: PLATELET COUNT CONFIRMED BY SMEAR     REPEATED TO VERIFY  BASIC METABOLIC PANEL     Status: Abnormal   Collection Time    04/15/13  4:54 AM      Result Value Range   Sodium 143  135 - 145 mEq/L   Potassium 5.5 (*) 3.5 - 5.1 mEq/L   Chloride 109  96 - 112 mEq/L   CO2 24  19 - 32 mEq/L   Glucose, Bld 121 (*) 70 - 99 mg/dL   BUN 19  6 - 23 mg/dL   Creatinine, Ser 8.46  0.50 - 1.35 mg/dL   Calcium 8.4  8.4 - 96.2 mg/dL   GFR calc non Af Amer 83 (*) >90 mL/min   GFR calc Af Amer >90  >90 mL/min   Comment: (NOTE)     The eGFR has been calculated using the CKD EPI equation.     This calculation has not been validated in all clinical situations.     eGFR's persistently <90 mL/min signify possible Chronic Kidney     Disease.  GLUCOSE, CAPILLARY     Status: Abnormal   Collection Time    04/15/13  8:50 AM      Result Value Range   Glucose-Capillary 114 (*) 70 - 99 mg/dL  HEPARIN LEVEL  (UNFRACTIONATED)     Status: Abnormal   Collection Time    04/16/13  4:00 AM      Result Value Range   Heparin Unfractionated 0.22 (*) 0.30 - 0.70 IU/mL   Comment:            IF HEPARIN RESULTS ARE BELOW     EXPECTED VALUES, AND PATIENT     DOSAGE HAS BEEN CONFIRMED,     SUGGEST FOLLOW UP TESTING     OF ANTITHROMBIN III LEVELS.  PROTIME-INR     Status: Abnormal   Collection Time    04/16/13  4:00 AM      Result Value Range   Prothrombin Time 16.0 (*) 11.6 - 15.2 seconds   INR 1.31  0.00 - 1.49  CBC     Status: Abnormal   Collection Time    04/16/13  4:00 AM      Result Value Range   WBC 12.2 (*) 4.0 - 10.5 K/uL   RBC 2.80 (*) 4.22 - 5.81 MIL/uL   Hemoglobin 9.0 (*) 13.0 - 17.0 g/dL   Comment: DELTA CHECK NOTED     REPEATED TO VERIFY   HCT 26.7 (*) 39.0 - 52.0 %   MCV 95.4  78.0 - 100.0 fL   MCH 32.1  26.0 - 34.0 pg   MCHC 33.7  30.0 - 36.0 g/dL   RDW 95.2 (*) 84.1 - 32.4 %   Platelets 155  150 - 400 K/uL   Comment: DELTA CHECK NOTED     REPEATED TO VERIFY  BASIC METABOLIC PANEL     Status: Abnormal   Collection Time    04/16/13  4:00 AM  Result Value Range   Sodium 145  135 - 145 mEq/L   Potassium 4.0  3.5 - 5.1 mEq/L   Comment: DELTA CHECK NOTED   Chloride 108  96 - 112 mEq/L   CO2 27  19 - 32 mEq/L   Glucose, Bld 130 (*) 70 - 99 mg/dL   BUN 19  6 - 23 mg/dL   Creatinine, Ser 1.61  0.50 - 1.35 mg/dL   Calcium 8.6  8.4 - 09.6 mg/dL   GFR calc non Af Amer 82 (*) >90 mL/min   GFR calc Af Amer >90  >90 mL/min   Comment: (NOTE)     The eGFR has been calculated using the CKD EPI equation.     This calculation has not been validated in all clinical situations.     eGFR's persistently <90 mL/min signify possible Chronic Kidney     Disease.  MAGNESIUM     Status: None   Collection Time    04/16/13  4:00 AM      Result Value Range   Magnesium 1.9  1.5 - 2.5 mg/dL  PHOSPHORUS     Status: None   Collection Time    04/16/13  4:00 AM      Result Value Range    Phosphorus 3.0  2.3 - 4.6 mg/dL  BLOOD GAS, ARTERIAL     Status: Abnormal   Collection Time    04/16/13  4:21 AM      Result Value Range   FIO2 0.40     Delivery systems VENTILATOR     Mode PRESSURE REGULATED VOLUME CONTROL     VT 500     Rate 14     Peep/cpap 5.0     pH, Arterial 7.444  7.350 - 7.450   pCO2 arterial 42.4  35.0 - 45.0 mmHg   pO2, Arterial 161.0 (*) 80.0 - 100.0 mmHg   Bicarbonate 28.6 (*) 20.0 - 24.0 mEq/L   TCO2 29.9  0 - 100 mmol/L   Acid-Base Excess 4.6 (*) 0.0 - 2.0 mmol/L   O2 Saturation 99.7     Patient temperature 98.6     Collection site RIGHT RADIAL     Drawn by COLLECTED BY RT     Sample type ARTERIAL DRAW     Allens test (pass/fail) PASS  PASS    Dg Chest Port 1 View  04/16/2013   CLINICAL DATA:  Check endotracheal tube position  EXAM: PORTABLE CHEST - 1 VIEW  COMPARISON:  04/14/2013  FINDINGS: An endotracheal tube is again identified in satisfactory position at the level of the aortic knob. Cardiac shadow is stable. Left basilar infiltrative density is again identified and stable. There has been relative clearing in the remainder of the lungs. A 2.9 cm nodular density is identified over the right upper lobe. This corresponds to the anterior aspect of the right 3rd rib anteriorly although remains suspicious for a nodular density. No other focal abnormality is noted.  IMPRESSION: Persistent left basilar infiltrate.  Right upper lobe nodule. CT of the chest is recommended when clinically able for further evaluation.   Electronically Signed   By: Alcide Clever M.D.   On: 04/16/2013 07:14    ROS: per adm  Blood pressure 182/81, pulse 86, temperature 98.8 F (37.1 C), temperature source Oral, resp. rate 18, height 5\' 6"  (1.676 m), weight 73.1 kg (161 lb 2.5 oz), SpO2 100.00%.  PHYSICAL EXAM: General appearance - Pt OT intubated, arousable Neck - sev contractions, ant neck  stable, prev trach scar  Studies Reviewed:none  Assessment/Plan: Plan tracheostomy  for vent support and wean. Pt scheduled for Monday 12/1 at noon. Please hold TF and anticoag at midnight Sunday. Obtain consent for Trach from pt's wife.  Jackqueline Aquilar 04/16/2013, 11:20 AM

## 2013-04-17 ENCOUNTER — Inpatient Hospital Stay (HOSPITAL_COMMUNITY): Payer: Medicare Other

## 2013-04-17 LAB — CBC
HCT: 28.9 % — ABNORMAL LOW (ref 39.0–52.0)
Hemoglobin: 9.9 g/dL — ABNORMAL LOW (ref 13.0–17.0)
MCHC: 34.3 g/dL (ref 30.0–36.0)
RBC: 3.01 MIL/uL — ABNORMAL LOW (ref 4.22–5.81)
WBC: 12.7 10*3/uL — ABNORMAL HIGH (ref 4.0–10.5)

## 2013-04-17 LAB — HEPARIN LEVEL (UNFRACTIONATED)
Heparin Unfractionated: 0.16 IU/mL — ABNORMAL LOW (ref 0.30–0.70)
Heparin Unfractionated: 0.68 IU/mL (ref 0.30–0.70)

## 2013-04-17 LAB — PROTIME-INR
INR: 1.11 (ref 0.00–1.49)
Prothrombin Time: 14.1 seconds (ref 11.6–15.2)

## 2013-04-17 LAB — GLUCOSE, CAPILLARY
Glucose-Capillary: 128 mg/dL — ABNORMAL HIGH (ref 70–99)
Glucose-Capillary: 133 mg/dL — ABNORMAL HIGH (ref 70–99)
Glucose-Capillary: 115 mg/dL — ABNORMAL HIGH (ref 70–99)
Glucose-Capillary: 119 mg/dL — ABNORMAL HIGH (ref 70–99)
Glucose-Capillary: 126 mg/dL — ABNORMAL HIGH (ref 70–99)
Glucose-Capillary: 127 mg/dL — ABNORMAL HIGH (ref 70–99)
Glucose-Capillary: 132 mg/dL — ABNORMAL HIGH (ref 70–99)
Glucose-Capillary: 139 mg/dL — ABNORMAL HIGH (ref 70–99)

## 2013-04-17 LAB — BASIC METABOLIC PANEL
BUN: 22 mg/dL (ref 6–23)
Chloride: 106 mEq/L (ref 96–112)
GFR calc Af Amer: >90 mL/min (ref >90)
GFR calc non Af Amer: 87 mL/min — ABNORMAL LOW (ref >90)
Potassium: 3.5 mEq/L (ref 3.5–5.1)
Sodium: 144 mEq/L (ref 135–145)

## 2013-04-17 MED ORDER — POTASSIUM CHLORIDE 20 MEQ/15ML (10%) PO LIQD
40.0000 meq | Freq: Once | ORAL | Status: AC
  Administered 2013-04-17: 40 meq
  Filled 2013-04-17: qty 30

## 2013-04-17 MED ORDER — LISINOPRIL 40 MG PO TABS
40.0000 mg | ORAL_TABLET | Freq: Every day | ORAL | Status: DC
  Administered 2013-04-17: 40 mg via ORAL
  Filled 2013-04-17 (×2): qty 1

## 2013-04-17 MED ORDER — FUROSEMIDE 10 MG/ML IJ SOLN
40.0000 mg | Freq: Once | INTRAMUSCULAR | Status: AC
  Administered 2013-04-17: 40 mg via INTRAVENOUS
  Filled 2013-04-17: qty 4

## 2013-04-17 MED ORDER — CARVEDILOL 12.5 MG PO TABS
12.5000 mg | ORAL_TABLET | Freq: Two times a day (BID) | ORAL | Status: DC
  Administered 2013-04-17 – 2013-04-27 (×22): 12.5 mg
  Filled 2013-04-17 (×26): qty 1

## 2013-04-17 NOTE — Progress Notes (Signed)
ANTICOAGULATION CONSULT NOTE - Follow Up Consult  Pharmacy Consult for Heparin  Indication: atrial fibrillation  No Known Allergies  Patient Measurements: Height: 5\' 6"  (167.6 cm) Weight: 160 lb 0.9 oz (72.6 kg) IBW/kg (Calculated) : 63.8  Vital Signs: Temp: 99.3 F (37.4 C) (11/29 1900) Temp src: Core (Comment) (11/29 1200) BP: 114/54 mmHg (11/29 1900) Pulse Rate: 74 (11/29 1900)  Labs:  Recent Labs  04/15/13 0454 04/16/13 0400 04/16/13 1400 04/17/13 0540 04/17/13 1935  HGB 12.7* 9.0*  --  9.9*  --   HCT 40.4 26.7*  --  28.9*  --   PLT 108* 155  --  201  --   LABPROT 15.5* 16.0*  --  14.1  --   INR 1.26 1.31  --  1.11  --   HEPARINUNFRC 0.30 0.22* 0.44 0.16* 0.68  CREATININE 0.91 0.95  --  0.81  --    Estimated Creatinine Clearance: 75.5 ml/min (by C-G formula based on Cr of 0.81).  Medications:  . sodium chloride 10 mL/hr at 04/15/13 1900  . sodium chloride Stopped (04/12/13 1400)  . sodium chloride 10 mL/hr (04/12/13 2009)  . feeding supplement (JEVITY 1.2 CAL) 65 mL/hr (04/17/13 0741)  . heparin 2,000 Units/hr (04/17/13 1529)     Assessment: 72 y/o M on heparin for afib, awaiting trach.  Heparin level is now therapeutic after rate adjustment.  Goal of Therapy:  Heparin level 0.3-0.7 units/ml Monitor platelets by anticoagulation protocol: Yes   Plan:  -Continue Heparin at 2000 units/hr -Daily CBC/HL -Monitor for bleeding  Estella Husk, Pharm.D., BCPS, AAHIVP Clinical Pharmacist Phone: [phone] or [phone] 04/17/2013, 8:42 PM

## 2013-04-17 NOTE — Progress Notes (Signed)
ANTICOAGULATION CONSULT NOTE - Follow Up Consult  Pharmacy Consult for Heparin  Indication: atrial fibrillation  No Known Allergies  Patient Measurements: Height: 5\' 6"  (167.6 cm) Weight: 160 lb 0.9 oz (72.6 kg) IBW/kg (Calculated) : 63.8  Vital Signs: Temp: 99.6 F (37.6 C) (11/29 0400) Temp src: Oral (11/29 0400) BP: 160/78 mmHg (11/29 0400) Pulse Rate: 79 (11/29 0400)  Labs:  Recent Labs  04/15/13 0454 04/16/13 0400 04/16/13 1400 04/17/13 0540  HGB 12.7* 9.0*  --  9.9*  HCT 40.4 26.7*  --  28.9*  PLT 108* 155  --  201  LABPROT 15.5* 16.0*  --  14.1  INR 1.26 1.31  --  1.11  HEPARINUNFRC 0.30 0.22* 0.44 0.16*  CREATININE 0.91 0.95  --   --    Estimated Creatinine Clearance: 64.4 ml/min (by C-G formula based on Cr of 0.95).  Medications:  Heparin 1850 units/hr  Assessment: 72 y/o M on heparin for afib, awaiting trach, HL is back down to 0.16.  Goal of Therapy:  Heparin level 0.3-0.7 units/ml Monitor platelets by anticoagulation protocol: Yes   Plan:  -Increase heparin drip to 2000 units/hr -8 hour HL at 1500 -Daily CBC/HL -Monitor for bleeding  Thank you for allowing me to take part in this patient's care,  Abran Duke, PharmD Clinical Pharmacist Phone: [phone] Pager: [phone] 04/17/2013 6:45 AM

## 2013-04-17 NOTE — Progress Notes (Signed)
Patient ID: [name] [name], male   DOB: June 29, 1940, 72 y.o.   MRN: [id number]   SUBJECTIVE: Patient remains intubated, sedated.  Plan noted for trach on 12/1.  He is in NSR.   Marland Kitchen amiodarone  400 mg Per Tube Daily  . antiseptic oral rinse  15 mL Mouth Rinse QID  . aspirin  81 mg Per J Tube Daily  . atorvastatin  80 mg Per Tube q1800  . carvedilol  12.5 mg Per Tube BID WC  . cefTRIAXone (ROCEPHIN)  IV  2 g Intravenous Q24H  . chlorhexidine  15 mL Mouth Rinse BID  . furosemide  40 mg Intravenous Once  . insulin aspart  0-15 Units Subcutaneous Q4H  . lisinopril  40 mg Oral Daily  . nitroGLYCERIN  1 inch Topical Q6H  . oxybutynin  5 mg Per Tube Daily  . pantoprazole sodium  40 mg Per Tube Daily  . potassium chloride  40 mEq Per Tube Once  heparin gtt    Filed Vitals:   04/17/13 0500 04/17/13 0715 04/17/13 0800 04/17/13 0813  BP:  186/97 200/76   Pulse:  84 85 79  Temp:   99.3 F (37.4 C)   TempSrc:   Core (Comment)   Resp:  20 14   Height:      Weight: 160 lb 0.9 oz (72.6 kg)     SpO2:  100% 100% 99%    Intake/Output Summary (Last 24 hours) at 04/17/13 0911 Last data filed at 04/17/13 0800  Gross per 24 hour  Intake 2327.5 ml  Output    800 ml  Net 1527.5 ml    LABS: Basic Metabolic Panel:  Recent Labs  78/29/56 0454 04/16/13 0400 04/17/13 0540  NA 143 145 144  K 5.5* 4.0 3.5  CL 109 108 106  CO2 24 27 29   GLUCOSE 121* 130* 132*  BUN 19 19 22   CREATININE 0.91 0.95 0.81  CALCIUM 8.4 8.6 8.8  MG  --  1.9  --   PHOS  --  3.0  --    Liver Function Tests: No results found for this basename: AST, ALT, ALKPHOS, BILITOT, PROT, ALBUMIN,  in the last 72 hours No results found for this basename: LIPASE, AMYLASE,  in the last 72 hours CBC:  Recent Labs  04/16/13 0400 04/17/13 0540  WBC 12.2* 12.7*  HGB 9.0* 9.9*  HCT 26.7* 28.9*  MCV 95.4 96.0  PLT 155 201   Cardiac Enzymes: No results found for this basename: CKTOTAL, CKMB, CKMBINDEX, TROPONINI,  in the  last 72 hours BNP: No components found with this basename: POCBNP,  D-Dimer: No results found for this basename: DDIMER,  in the last 72 hours Hemoglobin A1C: No results found for this basename: HGBA1C,  in the last 72 hours Fasting Lipid Panel: No results found for this basename: CHOL, HDL, LDLCALC, TRIG, CHOLHDL, LDLDIRECT,  in the last 72 hours Thyroid Function Tests: No results found for this basename: TSH, T4TOTAL, FREET3, T3FREE, THYROIDAB,  in the last 72 hours Anemia Panel: No results found for this basename: VITAMINB12, FOLATE, FERRITIN, TIBC, IRON, RETICCTPCT,  in the last 72 hours  RADIOLOGY: Dg Chest Port 1 View  04/17/2013   CLINICAL DATA:  Lung nodule  EXAM: PORTABLE CHEST - 1 VIEW  COMPARISON:  Answer previous  FINDINGS: Endotracheal tube stable. 2.7 cm right upper lobe nodule persists. Left lower lung consolidation/ atelectasis as before. Can't exclude small effusion. Stable cardiomegaly.  IMPRESSION: Old change from prior study  Electronically Signed   By: Oley Balm M.D.   On: 04/17/2013 07:24   Dg Chest Port 1 View  04/16/2013   CLINICAL DATA:  Check endotracheal tube position  EXAM: PORTABLE CHEST - 1 VIEW  COMPARISON:  04/14/2013  FINDINGS: An endotracheal tube is again identified in satisfactory position at the level of the aortic knob. Cardiac shadow is stable. Left basilar infiltrative density is again identified and stable. There has been relative clearing in the remainder of the lungs. A 2.9 cm nodular density is identified over the right upper lobe. This corresponds to the anterior aspect of the right 3rd rib anteriorly although remains suspicious for a nodular density. No other focal abnormality is noted.  IMPRESSION: Persistent left basilar infiltrate.  Right upper lobe nodule. CT of the chest is recommended when clinically able for further evaluation.   Electronically Signed   By: Alcide Clever M.D.   On: 04/16/2013 07:14    PHYSICAL EXAM General:  NAD Neck: JVP appears elevated, no thyromegaly or thyroid nodule.  Lungs: Coarse BS bilaterally CV: Nondisplaced PMI.  Heart regular S1/S2, no S3/S4, no murmur.  1+ edema 1/2 up lower legs bilaterally.  Abdomen: Soft, nontender, no hepatosplenomegaly, no distention.  Neurologic: Sedated Extremities: No clubbing or cyanosis.   TELEMETRY: Reviewed telemetry pt in NSR  ASSESSMENT AND PLAN: 72 yo presented with atrial fibrillation/RVR, NSTEMI, and PNA with respiratory failure.  1. Atrial fibrillation: Maintaining NSR on amiodarone.  Continue amiodarone.  On heparin gtt for now, plan to transition to coumadin after trach placement.  2. Respiratory failure: Due to PNA.  On antibiotics per CCM.  To get trach 12/1.  Suspect pulmonary edema may also play a role.  3. CHF: Acute/chronic predominantly diastolic CHF with EF 45-50%.  He looks at least mildly volume overloaded.  Will give dose of IV Lasix this morning and follow response.  4. CAD: Elevated troponin at admission was likely demand ischemia with respiratory failure.  He had 95% stenosis in a nondominant RCA on cath, medically manage.  He is on ASA and statin.  Can stop NTG paste, at this point likely not having much effect.  5. VT: No further VT.  On amiodaron.  6. H/o CVAs.  7. HTN: Increase lisinopril to 40 mg daily and change metoprolol to Coreg.    Marca Ancona 04/17/2013 9:16 AM

## 2013-04-17 NOTE — Progress Notes (Signed)
Earnest Rosier notified of inability to place PICC.

## 2013-04-17 NOTE — Progress Notes (Signed)
GU  Catheter coiled appropriately on abdomen as recommended by Dr. Patsi Sears. He has notified Dr. Annabell Howells who will determine disposition for catheter. Unlikely to place SP tube while patient is critically ill.  Natalia Leatherwood, MD

## 2013-04-17 NOTE — Progress Notes (Signed)
PULMONARY  / CRITICAL CARE MEDICINE  Name: [name] [name] MRN: [id number] DOB: 1940/10/29    ADMISSION DATE:  04/09/2013 CONSULTATION DATE:  04/09/2013  REFERRING MD :  Cardiology PRIMARY SERVICE: PCCM  CHIEF COMPLAINT:  Acute respiratory failure  BRIEF PATIENT DESCRIPTION: 72 y/o M with history of multiple CVA (aphasia, PEG) admitted with respiratory failure in setting of pneumonia and STEMI.  SIGNIFICANT EVENTS / STUDIES:  11/21 - Admitted with respiratory failure in setting of pneumonia and STEMI 11/21 - Cardiac cath >>> 95% occluded RCA, no intervention due to non dominant nature 11/22 - TTE >>> EF 45-50%, abnormal relaxation, mild aortic regurgitation 11/29 - remains on vent, pending trach on 12/1 per ENT  LINES / TUBES: Foley 11/21 >>> PEG ??? >>> R fem CVL 11/21 >>>out R fem A-Line 11/21 >>> out  CULTURES: 11/21 Blood >>> Neg 11/22 Sputum >>> WBCs, no organisms  ANTIBIOTICS: Azithromycin 11/21 >>>11/28 Ceftriaxone 11/21>>>  SUBJECTIVE:  Ongoing low grade fevers, mild leukocytosis.  RN reports limited PIV access.  Asking for PICC line  VITAL SIGNS: Temp:  [98.7 F (37.1 C)-99.6 F (37.6 C)] 99.3 F (37.4 C) (11/29 0800) Pulse Rate:  [78-92] 79 (11/29 0813) Resp:  [14-22] 14 (11/29 0800) BP: (139-200)/(59-97) 200/76 mmHg (11/29 0800) SpO2:  [97 %-100 %] 99 % (11/29 0813) FiO2 (%):  [30 %] 30 % (11/29 0813) Weight:  [160 lb 0.9 oz (72.6 kg)] 160 lb 0.9 oz (72.6 kg) (11/29 0500)  HEMODYNAMICS:    VENTILATOR SETTINGS: Vent Mode:  [-] PRVC FiO2 (%):  [30 %] 30 % Set Rate:  [14 bmp] 14 bmp Vt Set:  [500 mL] 500 mL PEEP:  [5 cmH20] 5 cmH20 Plateau Pressure:  [15 cmH20-22 cmH20] 18 cmH20  INTAKE / OUTPUT: Intake/Output     11/28 0701 - 11/29 0700 11/29 0701 - 11/30 0700   I.V. (mL/kg) 664.5 (9.2) 30 (0.4)   Other 210    NG/GT 1495 65   IV Piggyback 50    Total Intake(mL/kg) 2419.5 (33.3) 95 (1.3)   Urine (mL/kg/hr) 960 (0.6) 15 (0)   Total Output 960  15   Net +1459.5 +80          PHYSICAL EXAMINATION: General:  Awake and intubated, NAD Neuro:  Awake, alert, nonverbal due to intubation, follows commands, seems appropriate HEENT:  OETT, MM moist and pink Cardiovascular:  Irregular, no murmurs Lungs:  Bilateral air entry, rhonchi R>L Abdomen:  Soft, PEG site intact, bowel sounds present Musculoskeletal:  Torticollis, contractures, bilateral foot drop Skin:  Penile erosion, skin thin and shiny  LABS:  CBC  Recent Labs Lab 04/15/13 0454 04/16/13 0400 04/17/13 0540  WBC 10.1 12.2* 12.7*  HGB 12.7* 9.0* 9.9*  HCT 40.4 26.7* 28.9*  PLT 108* 155 201   Coag's  Recent Labs Lab 04/15/13 0454 04/16/13 0400 04/17/13 0540  INR 1.26 1.31 1.11   BMET  Recent Labs Lab 04/15/13 0454 04/16/13 0400 04/17/13 0540  NA 143 145 144  K 5.5* 4.0 3.5  CL 109 108 106  CO2 24 27 29   BUN 19 19 22   CREATININE 0.91 0.95 0.81  GLUCOSE 121* 130* 132*   Electrolytes  Recent Labs Lab 04/15/13 0454 04/16/13 0400 04/17/13 0540  CALCIUM 8.4 8.6 8.8  MG  --  1.9  --   PHOS  --  3.0  --    Sepsis Markers  Recent Labs Lab 04/11/13 0500 04/12/13 0250  PROCALCITON 92.17 39.22    ABG  Recent Labs  Lab 04/16/13 0421  PHART 7.444  PCO2ART 42.4  PO2ART 161.0*   Liver Enzymes No results found for this basename: AST, ALT, ALKPHOS, BILITOT, ALBUMIN,  in the last 168 hours Cardiac Enzymes No results found for this basename: TROPONINI, PROBNP,  in the last 168 hours Glucose  Recent Labs Lab 04/15/13 0041 04/15/13 0421 04/15/13 0850 04/16/13 2113 04/17/13 0006 04/17/13 0503  GLUCAP 124* 100* 114* 123* 133* 128*    CXR:  11/29>>> Cardiomegaly. Bilateral airspace opacities, left slightly greater than right may reflect asymmetric  edema or infection.    ASSESSMENT / PLAN:  PULMONARY A:  Acute respiratory failure in setting of pneumonia (CAP vs aspiration) - improving.   Possibly element of pulmonary edema in  setting of VT. Pulmonary nodule RUL P:   -allow PSV wean with no plans for extubation -pending trach per ENT 12/1 -VAP bundle. -CT chest once acute issues resolved to clarify nodule   CARDIOVASCULAR A:  VT resolved with defibrillation.  STEMI s/p cardiac cath without intervention.  H/o CAD, HTN, AF. 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 P:  -Cardiology following. -Amiodarone. -Heparin gtt. -ASA, Plavix, Lipitor, Metoprolol. -plan for PICC line with limited access - heparin gtt & ABX needs  RENAL A:  AKI / oliguria - improving   Hypokalemia. Acquired hypospadius, chronic P:   -trend BMP -Appreciate Dr Belva Crome eval; hypospadius does not appear infected -Urology will decide disposition for catheter -will have to wait for suprapubic cath until critical illness resolved  GASTROINTESTINAL A:   Dysphagia s/p PEG. P:   -TF per Nutrition -Protonix for GI Px  HEMATOLOGIC A:   Hemodilution.   Therapeutic anticoagulation for ACS - NOT chronically anticoagulated for AF (reportedly). P:  -Trend CBC -Heparin gtt  INFECTIOUS A:   Pneumonia (aspiration vs CAP), PCT 92. P:   -Abx / cx as above, ct x 10ds until 11/30 -Monitor fever curve and WBCs  ENDOCRINE A:   Hyperglycemia. P:   - SSI  NEUROLOGIC A:   Encephalopathy H/o multiple CVAs with significant residual deficit Contractures P:   - Goal RASS 0 to -1 - Fentanyl PRN - Foot drop boots  Canary Brim, NP-C [facility] Pgr: [phone] or [phone]  Maintain on PS as tolerated, no extubation, trach via ENT early next week.  Care during the described time interval was provided by me and/or other providers on the critical care team.  I have reviewed this patient's available data, including medical history, events of note, physical examination and test results as part of my evaluation  CC time 35 min.  Alyson Reedy, M.D. [facility] Pulmonary/Critical Care Medicine. Pager:  [phone]. After hours pager: [phone].

## 2013-04-17 NOTE — Progress Notes (Signed)
Peripherally Inserted Central Catheter/Midline Placement  The IV Nurse has discussed with the patient and/or persons authorized to consent for the patient, the purpose of this procedure and the potential benefits and risks involved with this procedure.  The benefits include less needle sticks, lab draws from the catheter and patient may be discharged home with the catheter.  Risks include, but not limited to, infection, bleeding, blood clot (thrombus formation), and puncture of an artery; nerve damage and irregular heat beat.  Alternatives to this procedure were also discussed.  PICC/Midline Placement Documentation    Attempt to insert PICC unable to thread centrally meeting some type of obstruction.  If PICC still desired may consider IR.    Ethelda Chick 04/17/2013, 2:33 PM

## 2013-04-18 ENCOUNTER — Inpatient Hospital Stay (HOSPITAL_COMMUNITY): Payer: Medicare Other

## 2013-04-18 DIAGNOSIS — I509 Heart failure, unspecified: Secondary | ICD-10-CM

## 2013-04-18 DIAGNOSIS — I5033 Acute on chronic diastolic (congestive) heart failure: Secondary | ICD-10-CM

## 2013-04-18 LAB — CBC
MCH: 31.9 pg (ref 26.0–34.0)
MCHC: 33.3 g/dL (ref 30.0–36.0)
MCV: 95.6 fL (ref 78.0–100.0)
Platelets: 214 10*3/uL (ref 150–400)
RDW: 15.7 % — ABNORMAL HIGH (ref 11.5–15.5)
WBC: 13.5 10*3/uL — ABNORMAL HIGH (ref 4.0–10.5)

## 2013-04-18 LAB — COMPREHENSIVE METABOLIC PANEL
AST: 49 U/L — ABNORMAL HIGH (ref 0–37)
Albumin: 2.2 g/dL — ABNORMAL LOW (ref 3.5–5.2)
Alkaline Phosphatase: 85 U/L (ref 39–117)
BUN: 24 mg/dL — ABNORMAL HIGH (ref 6–23)
Chloride: 104 mEq/L (ref 96–112)
GFR calc Af Amer: >90 mL/min (ref >90)
Glucose, Bld: 133 mg/dL — ABNORMAL HIGH (ref 70–99)
Potassium: 3.9 mEq/L (ref 3.5–5.1)
Total Bilirubin: 0.3 mg/dL (ref 0.3–1.2)

## 2013-04-18 LAB — GLUCOSE, CAPILLARY
Glucose-Capillary: 133 mg/dL — ABNORMAL HIGH (ref 70–99)
Glucose-Capillary: 139 mg/dL — ABNORMAL HIGH (ref 70–99)
Glucose-Capillary: 137 mg/dL — ABNORMAL HIGH (ref 70–99)

## 2013-04-18 LAB — PROTIME-INR: Prothrombin Time: 15.7 seconds — ABNORMAL HIGH (ref 11.6–15.2)

## 2013-04-18 LAB — HEPARIN LEVEL (UNFRACTIONATED): Heparin Unfractionated: 0.82 IU/mL — ABNORMAL HIGH (ref 0.30–0.70)

## 2013-04-18 MED ORDER — POTASSIUM CHLORIDE 20 MEQ/15ML (10%) PO LIQD
40.0000 meq | Freq: Once | ORAL | Status: AC
  Administered 2013-04-18: 40 meq
  Filled 2013-04-18: qty 30

## 2013-04-18 MED ORDER — FUROSEMIDE 10 MG/ML IJ SOLN
40.0000 mg | Freq: Two times a day (BID) | INTRAMUSCULAR | Status: AC
  Administered 2013-04-18 (×2): 40 mg via INTRAVENOUS
  Filled 2013-04-18 (×2): qty 4

## 2013-04-18 MED ORDER — LISINOPRIL 20 MG PO TABS
20.0000 mg | ORAL_TABLET | Freq: Every day | ORAL | Status: DC
  Administered 2013-04-18 – 2013-04-22 (×5): 20 mg via ORAL
  Filled 2013-04-18 (×5): qty 1

## 2013-04-18 MED ORDER — HEPARIN (PORCINE) IN NACL 100-0.45 UNIT/ML-% IJ SOLN
1500.0000 [IU]/h | INTRAMUSCULAR | Status: DC
  Administered 2013-04-18: 1600 [IU]/h via INTRAVENOUS
  Administered 2013-04-18: 1750 [IU]/h via INTRAVENOUS
  Administered 2013-04-19 – 2013-04-21 (×4): 1500 [IU]/h via INTRAVENOUS
  Filled 2013-04-18 (×10): qty 250

## 2013-04-18 NOTE — Progress Notes (Signed)
Patient ID: [name] [name], male   DOB: 26-Dec-1940, 72 y.o.   MRN: [id number]   SUBJECTIVE: Patient remains intubated but awake.  Plan noted for trach on 12/1.  He is in NSR.  He had a run of NSVT last night.   Marland Kitchen amiodarone  400 mg Per Tube Daily  . antiseptic oral rinse  15 mL Mouth Rinse QID  . aspirin  81 mg Per J Tube Daily  . atorvastatin  80 mg Per Tube q1800  . carvedilol  12.5 mg Per Tube BID WC  . cefTRIAXone (ROCEPHIN)  IV  2 g Intravenous Q24H  . chlorhexidine  15 mL Mouth Rinse BID  . furosemide  40 mg Intravenous BID  . insulin aspart  0-15 Units Subcutaneous Q4H  . lisinopril  20 mg Oral Daily  . oxybutynin  5 mg Per Tube Daily  . pantoprazole sodium  40 mg Per Tube Daily  . potassium chloride  40 mEq Per Tube Once  heparin gtt    Filed Vitals:   04/18/13 0500 04/18/13 0600 04/18/13 0700 04/18/13 0747  BP: 81/50 98/61 123/60 123/60  Pulse: 85 87 86 94  Temp: 99.7 F (37.6 C) 99.9 F (37.7 C) 100.2 F (37.9 C)   TempSrc:      Resp: 15 16 15 16   Height:      Weight:      SpO2: 99% 100% 100% 99%    Intake/Output Summary (Last 24 hours) at 04/18/13 0806 Last data filed at 04/18/13 0600  Gross per 24 hour  Intake   2210 ml  Output   2710 ml  Net   -500 ml    LABS: Basic Metabolic Panel:  Recent Labs  40/98/11 0400 04/17/13 0540 04/18/13 0545  NA 145 144 144  K 4.0 3.5 3.9  CL 108 106 104  CO2 27 29 29   GLUCOSE 130* 132* 133*  BUN 19 22 24*  CREATININE 0.95 0.81 0.80  CALCIUM 8.6 8.8 8.6  MG 1.9  --   --   PHOS 3.0  --   --    Liver Function Tests:  Recent Labs  04/18/13 0545  AST 49*  ALT 77*  ALKPHOS 85  BILITOT 0.3  PROT 6.5  ALBUMIN 2.2*   No results found for this basename: LIPASE, AMYLASE,  in the last 72 hours CBC:  Recent Labs  04/17/13 0540 04/18/13 0545  WBC 12.7* 13.5*  HGB 9.9* 9.5*  HCT 28.9* 28.5*  MCV 96.0 95.6  PLT 201 214   Cardiac Enzymes: No results found for this basename: CKTOTAL, CKMB, CKMBINDEX,  TROPONINI,  in the last 72 hours BNP: No components found with this basename: POCBNP,  D-Dimer: No results found for this basename: DDIMER,  in the last 72 hours Hemoglobin A1C: No results found for this basename: HGBA1C,  in the last 72 hours Fasting Lipid Panel: No results found for this basename: CHOL, HDL, LDLCALC, TRIG, CHOLHDL, LDLDIRECT,  in the last 72 hours Thyroid Function Tests: No results found for this basename: TSH, T4TOTAL, FREET3, T3FREE, THYROIDAB,  in the last 72 hours Anemia Panel: No results found for this basename: VITAMINB12, FOLATE, FERRITIN, TIBC, IRON, RETICCTPCT,  in the last 72 hours  RADIOLOGY: Dg Chest Port 1 View  04/17/2013   CLINICAL DATA:  Lung nodule  EXAM: PORTABLE CHEST - 1 VIEW  COMPARISON:  Answer previous  FINDINGS: Endotracheal tube stable. 2.7 cm right upper lobe nodule persists. Left lower lung consolidation/ atelectasis as before. Can't  exclude small effusion. Stable cardiomegaly.  IMPRESSION: Old change from prior study   Electronically Signed   By: Oley Balm M.D.   On: 04/17/2013 07:24   Dg Chest Port 1 View  04/16/2013   CLINICAL DATA:  Check endotracheal tube position  EXAM: PORTABLE CHEST - 1 VIEW  COMPARISON:  04/14/2013  FINDINGS: An endotracheal tube is again identified in satisfactory position at the level of the aortic knob. Cardiac shadow is stable. Left basilar infiltrative density is again identified and stable. There has been relative clearing in the remainder of the lungs. A 2.9 cm nodular density is identified over the right upper lobe. This corresponds to the anterior aspect of the right 3rd rib anteriorly although remains suspicious for a nodular density. No other focal abnormality is noted.  IMPRESSION: Persistent left basilar infiltrate.  Right upper lobe nodule. CT of the chest is recommended when clinically able for further evaluation.   Electronically Signed   By: Alcide Clever M.D.   On: 04/16/2013 07:14    PHYSICAL  EXAM General: NAD Neck: JVP appears elevated, no thyromegaly or thyroid nodule.  Lungs: Coarse BS bilaterally CV: Nondisplaced PMI.  Heart regular S1/S2, no S3/S4, no murmur.  1+ edema 1/2 up lower legs bilaterally.  Abdomen: Soft, nontender, no hepatosplenomegaly, no distention.  Neurologic: Sedated Extremities: No clubbing or cyanosis.   TELEMETRY: Reviewed telemetry pt in NSR  ASSESSMENT AND PLAN: 72 yo presented with atrial fibrillation/RVR, NSTEMI, and PNA with respiratory failure.  1. Atrial fibrillation: Maintaining NSR on amiodarone.  Continue amiodarone.  On heparin gtt for now, plan to transition to coumadin after trach placement.  2. Respiratory failure: Due to PNA.  On antibiotics per CCM.  To get trach 12/1.  Suspect pulmonary edema may also play a role.  3. CHF: Acute/chronic predominantly diastolic CHF with EF 45-50%.  He looks at least mildly volume overloaded still.  Lasix 40 mg IV x 2 doses and reassess tomorrow.  4. CAD: Elevated troponin at admission was likely demand ischemia with respiratory failure.  He had 95% stenosis in a nondominant RCA on cath, medically manage.  He is on ASA and statin.  Can stop NTG paste, at this point likely not having much effect.  5. VT: Run of NSVT.  Give K this morning, keep on amiodarone.  6. H/o CVAs.  7. HTN: BP lower today, decrease lisinopril to 20 daily.     Marca Ancona 04/18/2013 8:06 AM

## 2013-04-18 NOTE — Progress Notes (Signed)
Patient has had a couple of non sustained 13-15 beat runs of v-tach. He is having frequent pvcs. MD notified and no new orders at this time. BP is 101/54. Will continue to monitor and notify MD as needed.

## 2013-04-18 NOTE — Progress Notes (Signed)
ANTICOAGULATION CONSULT NOTE - Follow Up Consult  Pharmacy Consult for Heparin  Indication: atrial fibrillation  No Known Allergies  Patient Measurements: Height: 5\' 6"  (167.6 cm) Weight: 161 lb 9.6 oz (73.3 kg) IBW/kg (Calculated) : 63.8  Vital Signs: Temp: 99.7 F (37.6 C) (11/30 1900) Temp src: Core (Comment) (11/30 1600) BP: 145/71 mmHg (11/30 1900) Pulse Rate: 86 (11/30 1900)  Labs:  Recent Labs  04/16/13 0400  04/17/13 0540 04/17/13 1935 04/18/13 0545 04/18/13 1850  HGB 9.0*  --  9.9*  --  9.5*  --   HCT 26.7*  --  28.9*  --  28.5*  --   PLT 155  --  201  --  214  --   LABPROT 16.0*  --  14.1  --  15.7*  --   INR 1.31  --  1.11  --  1.28  --   HEPARINUNFRC 0.22*  < > 0.16* 0.68 1.35* 0.82*  CREATININE 0.95  --  0.81  --  0.80  --   < > = values in this interval not displayed. Estimated Creatinine Clearance: 76.4 ml/min (by C-G formula based on Cr of 0.8).  Medications:  . sodium chloride 10 mL/hr at 04/15/13 1900  . sodium chloride Stopped (04/12/13 1400)  . sodium chloride 10 mL/hr (04/12/13 2009)  . feeding supplement (JEVITY 1.2 CAL) 65 mL/hr (04/18/13 1637)  . heparin 1,750 Units/hr (04/18/13 1251)     Assessment: 72 y/o M on heparin for afib, awaiting trach.  Heparin level remains slightly supratherapeutic after rate decrease today.  Goal of Therapy:  Heparin level 0.3-0.7 units/ml Monitor platelets by anticoagulation protocol: Yes   Plan:  Decrease Heparin to 1600 units/hr Check AM Heparin level and CBC  Estella Husk, Pharm.D., BCPS, AAHIVP Clinical Pharmacist Phone: [phone] or [phone] 04/18/2013, 8:01 PM

## 2013-04-18 NOTE — Progress Notes (Signed)
PULMONARY  / CRITICAL CARE MEDICINE  Name: [name] [name] MRN: [id number] DOB: May 13, 1941    ADMISSION DATE:  04/09/2013 CONSULTATION DATE:  04/09/2013  REFERRING MD :  Cardiology PRIMARY SERVICE: PCCM  CHIEF COMPLAINT:  Acute respiratory failure  BRIEF PATIENT DESCRIPTION: 72 y/o M with history of multiple CVA (aphasia, PEG) admitted with respiratory failure in setting of pneumonia and STEMI.  SIGNIFICANT EVENTS / STUDIES:  11/21 - Admitted with respiratory failure in setting of pneumonia and STEMI 11/21 - Cardiac cath >>> 95% occluded RCA, no intervention due to non dominant nature 11/22 - TTE >>> EF 45-50%, abnormal relaxation, mild aortic regurgitation 11/29 - remains on vent, pending trach on 12/1 per ENT  LINES / TUBES: Foley 11/21 >>> PEG ??? >>> R fem CVL 11/21 >>>out R fem A-Line 11/21 >>> out  CULTURES: 11/21 Blood >>> Neg 11/22 Sputum >>> WBCs, no organisms  ANTIBIOTICS: Azithromycin 11/21 >>>11/28 Ceftriaxone 11/21>>>11/30  SUBJECTIVE:  Ongoing low grade fevers, mild leukocytosis.  RN reports limited PIV access.  Asking for PICC line  VITAL SIGNS: Temp:  [99 F (37.2 C)-100.4 F (38 C)] 100 F (37.8 C) (11/30 1300) Pulse Rate:  [69-94] 93 (11/30 1300) Resp:  [14-34] 34 (11/30 1300) BP: (81-186)/(50-86) 138/75 mmHg (11/30 1300) SpO2:  [97 %-100 %] 97 % (11/30 1300) FiO2 (%):  [30 %] 30 % (11/30 1205) Weight:  [73.3 kg (161 lb 9.6 oz)] 73.3 kg (161 lb 9.6 oz) (11/30 0433)  HEMODYNAMICS:   VENTILATOR SETTINGS: Vent Mode:  [-] PSV;CPAP FiO2 (%):  [30 %] 30 % Set Rate:  [14 bmp-17 bmp] 14 bmp Vt Set:  [500 mL] 500 mL PEEP:  [5 cmH20] 5 cmH20 Pressure Support:  [10 cmH20] 10 cmH20 Plateau Pressure:  [17 cmH20-18 cmH20] 18 cmH20  INTAKE / OUTPUT: Intake/Output     11/29 0701 - 11/30 0700 11/30 0701 - 12/01 0700   I.V. (mL/kg) 700 (9.5) 127.6 (1.7)   Other 120 60   NG/GT 1560 390   IV Piggyback 50 50   Total Intake(mL/kg) 2430 (33.2) 627.6 (8.6)    Urine (mL/kg/hr) 2725 (1.5) 1000 (2)   Total Output 2725 1000   Net -295 -372.4        Stool Occurrence 2 x     PHYSICAL EXAMINATION: General:  Awake and intubated, NAD Neuro:  Awake, alert, nonverbal due to intubation, follows commands, seems appropriate HEENT:  OETT, MM moist and pink Cardiovascular:  Irregular, no murmurs Lungs:  Bilateral air entry, rhonchi R>L Abdomen:  Soft, PEG site intact, bowel sounds present Musculoskeletal:  Torticollis, contractures, bilateral foot drop Skin:  Penile erosion, skin thin and shiny  LABS:  CBC  Recent Labs Lab 04/16/13 0400 04/17/13 0540 04/18/13 0545  WBC 12.2* 12.7* 13.5*  HGB 9.0* 9.9* 9.5*  HCT 26.7* 28.9* 28.5*  PLT 155 201 214   Coag's  Recent Labs Lab 04/16/13 0400 04/17/13 0540 04/18/13 0545  INR 1.31 1.11 1.28   BMET  Recent Labs Lab 04/16/13 0400 04/17/13 0540 04/18/13 0545  NA 145 144 144  K 4.0 3.5 3.9  CL 108 106 104  CO2 27 29 29   BUN 19 22 24*  CREATININE 0.95 0.81 0.80  GLUCOSE 130* 132* 133*   Electrolytes  Recent Labs Lab 04/16/13 0400 04/17/13 0540 04/18/13 0545  CALCIUM 8.6 8.8 8.6  MG 1.9  --   --   PHOS 3.0  --   --    Sepsis Markers  Recent Labs Lab 04/12/13 0250  PROCALCITON 39.22   ABG  Recent Labs Lab 04/16/13 0421  PHART 7.444  PCO2ART 42.4  PO2ART 161.0*   Liver Enzymes  Recent Labs Lab 04/18/13 0545  AST 49*  ALT 77*  ALKPHOS 85  BILITOT 0.3  ALBUMIN 2.2*   Cardiac Enzymes No results found for this basename: TROPONINI, PROBNP,  in the last 168 hours Glucose  Recent Labs Lab 04/17/13 1607 04/17/13 1943 04/17/13 2318 04/18/13 0431 04/18/13 0727 04/18/13 1143  GLUCAP 145* 141* 129* 133* 139* 137*   CXR:  11/29>>> Cardiomegaly. Bilateral airspace opacities, left slightly greater than right may reflect asymmetric  edema or infection.   ASSESSMENT / PLAN:  PULMONARY A:  Acute respiratory failure in setting of pneumonia (CAP vs  aspiration) - improving.   Possibly element of pulmonary edema in setting of VT. Pulmonary nodule RUL P:   - Allow PSV wean with no plans for extubation - Pending trach per ENT 12/1 - VAP bundle. - CT chest once acute issues resolved to clarify nodule  CARDIOVASCULAR A:  VT resolved with defibrillation.  STEMI s/p cardiac cath without intervention.  H/o CAD, HTN, AF. 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 P:  - Cardiology following. - Amiodarone. - Heparin gtt. - ASA, Plavix, Lipitor, Metoprolol. - IR to place PICC line since PICC nurse was unable to.  RENAL A:  AKI / oliguria - improving   Hypokalemia. Acquired hypospadius, chronic P:   - Trend BMP - Appreciate Dr Belva Crome eval; hypospadius does not appear infected. - Urology will decide disposition for catheter. - Will have to wait for suprapubic cath until critical illness resolved.  GASTROINTESTINAL A:   Dysphagia s/p PEG. P:   - TF per Nutrition. - Protonix for GI Px.  HEMATOLOGIC A:   Hemodilution.   Therapeutic anticoagulation for ACS - NOT chronically anticoagulated for AF (reportedly). P:  - Trend CBC. - Heparin GTT.  INFECTIOUS A:   Pneumonia (aspiration vs CAP), PCT 92. P:   - Abx/cx as above, ct x 10ds until 11/30 as last dose, will D/C. - Monitor fever curve and WBCs.  ENDOCRINE A:   Hyperglycemia. P:   - SSI.  NEUROLOGIC A:   Encephalopathy H/o multiple CVAs with significant residual deficit Contractures P:   - Goal RASS 0 to -1 - Fentanyl PRN. - Foot drop boots.  Maintain on PS as tolerated, no extubation, trach via ENT early next week.  Care during the described time interval was provided by me and/or other providers on the critical care team.  I have reviewed this patient's available data, including medical history, events of note, physical examination and test results as part of my evaluation  CC time 35 min.  Alyson Reedy, M.D. [facility] Pulmonary/Critical Care  Medicine. Pager: [phone]. After hours pager: [phone].

## 2013-04-18 NOTE — Progress Notes (Addendum)
ANTICOAGULATION CONSULT NOTE - Follow Up Consult  Pharmacy Consult for Heparin  Indication: atrial fibrillation  No Known Allergies  Patient Measurements: Height: 5\' 6"  (167.6 cm) Weight: 161 lb 9.6 oz (73.3 kg) IBW/kg (Calculated) : 63.8  Vital Signs: Temp: 100.4 F (38 C) (11/30 0900) BP: 102/57 mmHg (11/30 0900) Pulse Rate: 89 (11/30 0900)  Labs:  Recent Labs  04/16/13 0400  04/17/13 0540 04/17/13 1935 04/18/13 0545  HGB 9.0*  --  9.9*  --  9.5*  HCT 26.7*  --  28.9*  --  28.5*  PLT 155  --  201  --  214  LABPROT 16.0*  --  14.1  --  15.7*  INR 1.31  --  1.11  --  1.28  HEPARINUNFRC 0.22*  < > 0.16* 0.68 1.35*  CREATININE 0.95  --  0.81  --  0.80  < > = values in this interval not displayed. Estimated Creatinine Clearance: 76.4 ml/min (by C-G formula based on Cr of 0.8).  Medications:  . sodium chloride 10 mL/hr at 04/15/13 1900  . sodium chloride Stopped (04/12/13 1400)  . sodium chloride 10 mL/hr (04/12/13 2009)  . feeding supplement (JEVITY 1.2 CAL) 1,000 mL (04/18/13 0140)     Assessment: 72 y/o M on heparin for afib, awaiting trach.  Heparin level has increased to 1.35, supratherapeutic on 2000 units/hr IV heparin drip. RN reports patient does not have a central line thus level most likely accurate. No bleeding noted per RN.  Need to hold heparin drip x 1 hour. Will recheck heparin level in 1hr as heparin level has fluctuated down to 0.16 yesterday, then 0.68 after rate increased and now up to 1.35.  Hgb 9.5 from 9.9 and pltc stable at 214K.   Goal of Therapy:  Heparin level 0.3-0.7 units/ml Monitor platelets by anticoagulation protocol: Yes   Plan:  -Hold heparin drip x 1 hours. -Check STAT Heparin level in 1 hours -Daily CBC/HL -Monitor for bleeding  Noah Delaine, RPh Clinical Pharmacist Pager: [phone] 04/18/2013, 10:11 AM  Addendum:  Cancel STAT heparin level.  No bleeding reported. Restart heparin at lower rate of 1750 units/hr. Check 6 hour  heparin level. Plan for trach tomorrow 04/19/13.  Noah Delaine, RPh Clinical Pharmacist Pager: [phone] 04/18/2013 11:42 AM

## 2013-04-19 ENCOUNTER — Inpatient Hospital Stay (HOSPITAL_COMMUNITY): Payer: Medicare Other

## 2013-04-19 ENCOUNTER — Encounter (HOSPITAL_COMMUNITY): Payer: Self-pay | Admitting: Certified Registered Nurse Anesthetist

## 2013-04-19 ENCOUNTER — Inpatient Hospital Stay (HOSPITAL_COMMUNITY): Payer: Medicare Other | Admitting: Certified Registered Nurse Anesthetist

## 2013-04-19 ENCOUNTER — Encounter (HOSPITAL_COMMUNITY)
Admission: EM | Disposition: A | Discharge status: Home Health | Payer: Self-pay | Source: Home / Self Care | Attending: Cardiology

## 2013-04-19 ENCOUNTER — Encounter (HOSPITAL_COMMUNITY): Payer: Medicare Other | Admitting: Certified Registered Nurse Anesthetist

## 2013-04-19 HISTORY — PX: TRACHEOSTOMY TUBE PLACEMENT: SHX814

## 2013-04-19 LAB — BASIC METABOLIC PANEL
BUN: 28 mg/dL — ABNORMAL HIGH (ref 6–23)
CO2: 31 mEq/L (ref 19–32)
Chloride: 105 mEq/L (ref 96–112)
Creatinine, Ser: 0.87 mg/dL (ref 0.50–1.35)
Sodium: 146 mEq/L — ABNORMAL HIGH (ref 135–145)

## 2013-04-19 LAB — CBC
HCT: 25 % — ABNORMAL LOW (ref 39.0–52.0)
Hemoglobin: 8.5 g/dL — ABNORMAL LOW (ref 13.0–17.0)
MCH: 32.6 pg (ref 26.0–34.0)
MCHC: 34 g/dL (ref 30.0–36.0)
Platelets: 210 10*3/uL (ref 150–400)
RBC: 2.61 MIL/uL — ABNORMAL LOW (ref 4.22–5.81)

## 2013-04-19 LAB — BLOOD GAS, ARTERIAL
Acid-Base Excess: 9.5 mmol/L — ABNORMAL HIGH (ref 0.0–2.0)
Bicarbonate: 33.6 mEq/L — ABNORMAL HIGH (ref 20.0–24.0)
Drawn by: 331761
FIO2: 0.3 %
O2 Saturation: 99 %
PEEP: 5 cmH2O
Patient temperature: 100.4
RATE: 14 resp/min

## 2013-04-19 LAB — GLUCOSE, CAPILLARY: Glucose-Capillary: 91 mg/dL (ref 70–99)

## 2013-04-19 SURGERY — CREATION, TRACHEOSTOMY
Anesthesia: General | Site: Neck

## 2013-04-19 MED ORDER — ROCURONIUM BROMIDE 100 MG/10ML IV SOLN
INTRAVENOUS | Status: DC | PRN
  Administered 2013-04-19: 30 mg via INTRAVENOUS
  Administered 2013-04-19: 20 mg via INTRAVENOUS

## 2013-04-19 MED ORDER — FENTANYL CITRATE 0.05 MG/ML IJ SOLN
INTRAMUSCULAR | Status: DC | PRN
  Administered 2013-04-19 (×5): 50 ug via INTRAVENOUS

## 2013-04-19 MED ORDER — LACTATED RINGERS IV SOLN
INTRAVENOUS | Status: DC | PRN
  Administered 2013-04-19: 12:00:00 via INTRAVENOUS

## 2013-04-19 MED ORDER — ONDANSETRON HCL 4 MG/2ML IJ SOLN: 4.0000 mg | Freq: Once | INTRAMUSCULAR | Status: AC | PRN

## 2013-04-19 MED ORDER — OXYCODONE HCL 5 MG PO TABS: 5.0000 mg | ORAL_TABLET | Freq: Once | ORAL | Status: AC | PRN

## 2013-04-19 MED ORDER — FUROSEMIDE 40 MG PO TABS
40.0000 mg | ORAL_TABLET | Freq: Every day | ORAL | Status: DC
  Administered 2013-04-19 – 2013-04-21 (×3): 40 mg via ORAL
  Filled 2013-04-19 (×3): qty 1

## 2013-04-19 MED ORDER — WARFARIN VIDEO
Freq: Once | Status: AC
  Administered 2013-04-19: 16:00:00

## 2013-04-19 MED ORDER — COUMADIN BOOK
Freq: Once | Status: AC
  Administered 2013-04-19: 16:00:00
  Filled 2013-04-19: qty 1

## 2013-04-19 MED ORDER — LIDOCAINE-EPINEPHRINE 1 %-1:100000 IJ SOLN
INTRAMUSCULAR | Status: AC
  Filled 2013-04-19: qty 1

## 2013-04-19 MED ORDER — OXYCODONE HCL 5 MG/5ML PO SOLN
5.0000 mg | Freq: Once | ORAL | Status: AC | PRN
Start: 2013-04-19 — End: 2013-04-19

## 2013-04-19 MED ORDER — WARFARIN SODIUM 7.5 MG PO TABS
7.5000 mg | ORAL_TABLET | Freq: Once | ORAL | Status: AC
  Administered 2013-04-19: 7.5 mg via ORAL
  Filled 2013-04-19: qty 1

## 2013-04-19 MED ORDER — LIDOCAINE-EPINEPHRINE 1 %-1:100000 IJ SOLN
INTRAMUSCULAR | Status: DC | PRN
  Administered 2013-04-19: 30 mL

## 2013-04-19 MED ORDER — 0.9 % SODIUM CHLORIDE (POUR BTL) OPTIME
TOPICAL | Status: DC | PRN
  Administered 2013-04-19: 1000 mL

## 2013-04-19 MED ORDER — PROPOFOL 10 MG/ML IV BOLUS
INTRAVENOUS | Status: DC | PRN
  Administered 2013-04-19: 50 mg via INTRAVENOUS

## 2013-04-19 MED ORDER — HYDROMORPHONE HCL PF 1 MG/ML IJ SOLN: 0.2500 mg | INTRAMUSCULAR | Status: DC | PRN

## 2013-04-19 MED ORDER — ONDANSETRON HCL 4 MG/2ML IJ SOLN
INTRAMUSCULAR | Status: DC | PRN
  Administered 2013-04-19: 4 mg via INTRAVENOUS

## 2013-04-19 MED ORDER — WARFARIN - PHARMACIST DOSING INPATIENT
Freq: Every day | Status: DC
  Administered 2013-04-19 – 2013-04-20 (×2)

## 2013-04-19 MED ORDER — MIDAZOLAM HCL 5 MG/5ML IJ SOLN
INTRAMUSCULAR | Status: DC | PRN
  Administered 2013-04-19 (×2): 1 mg via INTRAVENOUS

## 2013-04-19 SURGICAL SUPPLY — 45 items
BLADE SURG 11 STRL SS (BLADE) IMPLANT
BLADE SURG 15 STRL LF DISP TIS (BLADE) ×1 IMPLANT
BLADE SURG 15 STRL SS (BLADE) ×1
BLADE SURG ROTATE 9660 (MISCELLANEOUS) IMPLANT
CANISTER SUCTION 2500CC (MISCELLANEOUS) ×2 IMPLANT
CLEANER TIP ELECTROSURG 2X2 (MISCELLANEOUS) ×2 IMPLANT
CLOTH BEACON ORANGE TIMEOUT ST (SAFETY) ×2 IMPLANT
COVER SURGICAL LIGHT HANDLE (MISCELLANEOUS) ×2 IMPLANT
DECANTER SPIKE VIAL GLASS SM (MISCELLANEOUS) ×2 IMPLANT
ELECT COATED BLADE 2.86 ST (ELECTRODE) ×2 IMPLANT
ELECT REM PT RETURN 9FT ADLT (ELECTROSURGICAL) ×2
ELECTRODE REM PT RTRN 9FT ADLT (ELECTROSURGICAL) ×1 IMPLANT
GAUZE SPONGE 4X4 16PLY XRAY LF (GAUZE/BANDAGES/DRESSINGS) ×2 IMPLANT
GAUZE XEROFORM 5X9 LF (GAUZE/BANDAGES/DRESSINGS) IMPLANT
GLOVE BIO SURGEON STRL SZ 6.5 (GLOVE) ×4 IMPLANT
GLOVE BIO SURGEON STRL SZ7 (GLOVE) ×2 IMPLANT
GLOVE BIOGEL M 7.0 STRL (GLOVE) ×2 IMPLANT
GLOVE BIOGEL PI IND STRL 6.5 (GLOVE) ×1 IMPLANT
GLOVE BIOGEL PI IND STRL 7.5 (GLOVE) ×1 IMPLANT
GLOVE BIOGEL PI INDICATOR 6.5 (GLOVE) ×1
GLOVE BIOGEL PI INDICATOR 7.5 (GLOVE) ×1
GOWN STRL NON-REIN LRG LVL3 (GOWN DISPOSABLE) ×6 IMPLANT
HOLDER TRACH TUBE VELCRO 19.5 (MISCELLANEOUS) ×2 IMPLANT
KIT BASIN OR (CUSTOM PROCEDURE TRAY) ×2 IMPLANT
KIT ROOM TURNOVER OR (KITS) ×2 IMPLANT
KIT SUCTION CATH 14FR (SUCTIONS) IMPLANT
NEEDLE HYPO 25GX1X1/2 BEV (NEEDLE) ×2 IMPLANT
NS IRRIG 1000ML POUR BTL (IV SOLUTION) ×2 IMPLANT
PACK EENT II TURBAN DRAPE (CUSTOM PROCEDURE TRAY) ×2 IMPLANT
PAD ARMBOARD 7.5X6 YLW CONV (MISCELLANEOUS) ×4 IMPLANT
PENCIL BUTTON HOLSTER BLD 10FT (ELECTRODE) ×2 IMPLANT
SPONGE DRAIN TRACH 4X4 STRL 2S (GAUZE/BANDAGES/DRESSINGS) ×2 IMPLANT
SPONGE INTESTINAL PEANUT (DISPOSABLE) ×2 IMPLANT
SUT CHROMIC 2 0 SH (SUTURE) ×2 IMPLANT
SUT ETHILON 2 0 FS 18 (SUTURE) ×4 IMPLANT
SUT SILK 2 0 FS (SUTURE) ×2 IMPLANT
SUT SILK 3 0 REEL (SUTURE) ×2 IMPLANT
SYR 20CC LL (SYRINGE) ×2 IMPLANT
SYR BULB IRRIGATION 50ML (SYRINGE) ×2 IMPLANT
SYR CONTROL 10ML LL (SYRINGE) ×2 IMPLANT
TOWEL OR 17X24 6PK STRL BLUE (TOWEL DISPOSABLE) ×2 IMPLANT
TOWEL OR 17X26 10 PK STRL BLUE (TOWEL DISPOSABLE) ×2 IMPLANT
TUBE CONNECTING 12X1/4 (SUCTIONS) ×2 IMPLANT
TUBE TRACH SHILEY  6 DIST  CUF (TUBING) ×2 IMPLANT
WATER STERILE IRR 1000ML POUR (IV SOLUTION) ×2 IMPLANT

## 2013-04-19 NOTE — Transfer of Care (Signed)
Immediate Anesthesia Transfer of Care Note  Patient: [name] [name]  Procedure(s) Performed: Procedure(s): TRACHEOSTOMY (N/A)  Patient Location: ICU  Anesthesia Type:General  Level of Consciousness: Patient remains intubated per anesthesia plan  Airway & Oxygen Therapy: Patient remains intubated per anesthesia plan  Post-op Assessment: Report given to PACU RN  Post vital signs: Reviewed and stable  Complications: No apparent anesthesia complications

## 2013-04-19 NOTE — Preoperative (Signed)
Beta Blockers   Reason not to administer Beta Blockers:Not Applicable 

## 2013-04-19 NOTE — Progress Notes (Signed)
ANTICOAGULATION CONSULT NOTE   Pharmacy Consult for Heparin Indication: atrial fibrillation  No Known Allergies  Patient Measurements: Height: 5\' 6"  (167.6 cm) Weight: 153 lb 3.5 oz (69.5 kg) IBW/kg (Calculated) : 63.8  Vital Signs: Temp: 99.1 F (37.3 C) (12/01 2200) BP: 135/76 mmHg (12/01 2200) Pulse Rate: 81 (12/01 2200)  Labs:  Recent Labs  04/17/13 0540  04/18/13 0545 04/18/13 1850 04/19/13 0415 04/19/13 2300  HGB 9.9*  --  9.5*  --  8.5*  --   HCT 28.9*  --  28.5*  --  25.0*  --   PLT 201  --  214  --  210  --   LABPROT 14.1  --  15.7*  --  15.1  --   INR 1.11  --  1.28  --  1.22  --   HEPARINUNFRC 0.16*  < > 1.35* 0.82* 0.74* 0.56  CREATININE 0.81  --  0.80  --  0.87  --   < > = values in this interval not displayed. Estimated Creatinine Clearance: 70.3 ml/min (by C-G formula based on Cr of 0.87).  Assessment: 72 y/o Male with Afib for heparin  Goal of Therapy:  Heparin level 0.3-0.7 units/ml Monitor platelets by anticoagulation protocol: Yes   Plan:  Continue Heparin at current rate  Follow-up am labs.  Geannie Risen, PharmD, BCPS

## 2013-04-19 NOTE — Progress Notes (Signed)
PULMONARY  / CRITICAL CARE MEDICINE  Name: [name] [name] MRN: [id number] DOB: 20-Dec-1940    ADMISSION DATE:  04/09/2013 CONSULTATION DATE:  04/09/2013  REFERRING MD :  Cardiology PRIMARY SERVICE: PCCM  CHIEF COMPLAINT:  Acute respiratory failure  BRIEF PATIENT DESCRIPTION: 72 y/o M with history of multiple CVA (aphasia, PEG) admitted with respiratory failure in setting of pneumonia and STEMI.  SIGNIFICANT EVENTS / STUDIES:  11/21 - Admitted with respiratory failure in setting of pneumonia and STEMI 11/21 - Cardiac cath >>> 95% occluded RCA, no intervention due to non dominant nature 11/22 - TTE >>> EF 45-50%, abnormal relaxation, mild aortic regurgitation 11/29 - Remains on vent, pending trach on 12/1 per ENT 12/01 - Tracheostomy by ENT  LINES / TUBES: Foley 11/21 >>> PEG ??? >>> R fem CVL 11/21 >>> out R fem A-Line 11/21 >>> out R PICC 12/1 >>>  CULTURES: 11/21 Blood >>> Neg 11/22 Sputum >>> WBCs, no organisms  ANTIBIOTICS: Azithromycin 11/21 >>>11/28 Ceftriaxone 11/21>>>11/30  SUBJECTIVE:  Ongoing low grade fevers, mild leukocytosis trending up.  Patient is awake and follow commands.  Nodding to doing ok.  Agitation intermittently with attempt to pull out ET tube.  VITAL SIGNS: Temp:  [99.1 F (37.3 C)-100.2 F (37.9 C)] 100.2 F (37.9 C) (12/01 0800) Pulse Rate:  [72-104] 92 (12/01 0923) Resp:  [14-34] 14 (12/01 0923) BP: (100-182)/(59-105) 134/105 mmHg (12/01 0923) SpO2:  [97 %-100 %] 100 % (12/01 0923) FiO2 (%):  [30 %] 30 % (12/01 0923) Weight:  [69.5 kg (153 lb 3.5 oz)] 69.5 kg (153 lb 3.5 oz) (12/01 0450)  HEMODYNAMICS:   VENTILATOR SETTINGS: Vent Mode:  [-] PRVC FiO2 (%):  [30 %] 30 % Set Rate:  [14 bmp] 14 bmp Vt Set:  [500 mL] 500 mL PEEP:  [5 cmH20] 5 cmH20 Pressure Support:  [10 cmH20] 10 cmH20 Plateau Pressure:  [15 cmH20-21 cmH20] 18 cmH20  INTAKE / OUTPUT: Intake/Output     11/30 0701 - 12/01 0700 12/01 0701 - 12/02 0700   I.V. (mL/kg)  580.2 (8.3)    Other 120    NG/GT 1040    IV Piggyback 50    Total Intake(mL/kg) 1790.2 (25.8)    Urine (mL/kg/hr) 2075 (1.2) 300 (1.7)   Total Output 2075 300   Net -284.8 -300         PHYSICAL EXAMINATION: General:  Awake and intubated, NAD Neuro:  Awake, alert, nonverbal due to intubation, follows commands, seems appropriate HEENT:  OETT, MM moist and pink Cardiovascular:  Irregular, no murmurs Lungs:  Bilateral air entry, rhonchi R>L Abdomen:  Soft, PEG site intact, bowel sounds present Musculoskeletal:  Torticollis, contractures, bilateral foot drop Skin:  Penile erosion, skin thin and shiny  LABS:  CBC  Recent Labs Lab 04/17/13 0540 04/18/13 0545 04/19/13 0415  WBC 12.7* 13.5* 15.7*  HGB 9.9* 9.5* 8.5*  HCT 28.9* 28.5* 25.0*  PLT 201 214 210   Coag's  Recent Labs Lab 04/17/13 0540 04/18/13 0545 04/19/13 0415  INR 1.11 1.28 1.22   BMET  Recent Labs Lab 04/17/13 0540 04/18/13 0545 04/19/13 0415  NA 144 144 146*  K 3.5 3.9 3.8  CL 106 104 105  CO2 29 29 31   BUN 22 24* 28*  CREATININE 0.81 0.80 0.87  GLUCOSE 132* 133* 85   Electrolytes  Recent Labs Lab 04/16/13 0400 04/17/13 0540 04/18/13 0545 04/19/13 0415  CALCIUM 8.6 8.8 8.6 8.8  MG 1.9  --   --  2.0  PHOS  3.0  --   --  3.9   Sepsis Markers No results found for this basename: LATICACIDVEN, PROCALCITON, O2SATVEN,  in the last 168 hours ABG  Recent Labs Lab 04/16/13 0421 04/19/13 0435  PHART 7.444 7.459*  PCO2ART 42.4 48.6*  PO2ART 161.0* 117.0*   Liver Enzymes  Recent Labs Lab 04/18/13 0545  AST 49*  ALT 77*  ALKPHOS 85  BILITOT 0.3  ALBUMIN 2.2*   Cardiac Enzymes No results found for this basename: TROPONINI, PROBNP,  in the last 168 hours Glucose  Recent Labs Lab 04/18/13 0727 04/18/13 1143 04/18/13 1625 04/18/13 1930 04/18/13 2335 04/19/13 0329  GLUCAP 139* 137* 135* 108* 131* 91   CXR:  11/29>>> Cardiomegaly. Bilateral airspace opacities, left slightly  greater than right may reflect asymmetric  edema or infection.   ASSESSMENT / PLAN:  PULMONARY A:  Acute respiratory failure in setting of pneumonia (CAP vs aspiration) - improving.   Possibly element of pulmonary edema in setting of VT. Pulmonary nodule RUL.  P:   - Allow PSV 5-10 wean with no plans for extubation - Pending trach per ENT 12/1 at 11.30AM - VAP bundle - CT chest once acute issues resolved to clarify nodule -ABG reviewed, reduce MV slight  CARDIOVASCULAR A:  VT resolved with defibrillation.  STEMI s/p cardiac cath without intervention.  H/o CAD, HTN, AF. 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  P:  - Cardiology following. - Amiodarone. - Heparin gtt, on hold until tracheostomy by IR - ASA, Plavix, Lipitor, Metoprolol. - PICC line placed  RENAL A:  AKI / oliguria - improving   Hypokalemia. Acquired hypospadius, chronic Mild hypernatremia  P:   - Trend BMP - Appreciate Dr Belva Crome eval; hypospadius does not appear infected. - Urology will decide disposition for catheter. - Will have to wait for suprapubic cath until critical illness resolved. Correct na if rise  GASTROINTESTINAL A:   Dysphagia s/p PEG.  P:   - TF per Nutrition. - Protonix for GI Px.  HEMATOLOGIC A:   Hemodilution.   Therapeutic anticoagulation for ACS - NOT chronically anticoagulated for AF (reportedly).  P:  - Trend CBC. - Heparin GTT, hold for ENT trach  INFECTIOUS A:   Pneumonia (aspiration vs CAP), PCT 92 Mild Trending Up Leukocytosis  - Pleural effusion on CXR, ?parapneumonic?  P:   - Monitor fever curve and WBCs, pcxr in am   ENDOCRINE A:   Hyperglycemia.  P:   - SSI.  NEUROLOGIC A:   Encephalopathy H/o multiple CVAs with significant residual deficit Contractures  P:   - Goal RASS 0 to -1 - Fentanyl PRN. - Foot drop boots.  Care during the described time interval was provided by me and/or other providers on the critical care team.  I have  reviewed this patient's available data, including medical history, events of note, physical examination and test results as part of my evaluation  CC time 30 min.  Mcarthur Rossetti. Tyson Alias, MD, FACP Pgr: [phone] [facility]

## 2013-04-19 NOTE — Progress Notes (Addendum)
ANTICOAGULATION CONSULT NOTE -  Follow Up Consult for Heparin Initial Consult for Coumadin  Pharmacy Consult for Heparin/coumadin  Indication: STEMI/atrial fibrillation  No Known Allergies  Patient Measurements: Height: 5\' 6"  (167.6 cm) Weight: 153 lb 3.5 oz (69.5 kg) IBW/kg (Calculated) : 63.8  Vital Signs: Temp: 98.1 F (36.7 C) (12/01 1100) Temp src: Core (Comment) (12/01 1100) BP: 131/79 mmHg (12/01 1100) Pulse Rate: 88 (12/01 1000)  Labs:  Recent Labs  04/17/13 0540  04/18/13 0545 04/18/13 1850 04/19/13 0415  HGB 9.9*  --  9.5*  --  8.5*  HCT 28.9*  --  28.5*  --  25.0*  PLT 201  --  214  --  210  LABPROT 14.1  --  15.7*  --  15.1  INR 1.11  --  1.28  --  1.22  HEPARINUNFRC 0.16*  < > 1.35* 0.82* 0.74*  CREATININE 0.81  --  0.80  --  0.87  < > = values in this interval not displayed. Estimated Creatinine Clearance: 70.3 ml/min (by C-G formula based on Cr of 0.87).  Medications:  . sodium chloride 10 mL/hr at 04/15/13 1900  . sodium chloride Stopped (04/12/13 1400)  . sodium chloride 10 mL/hr (04/12/13 2009)  . feeding supplement (JEVITY 1.2 CAL) Stopped (04/19/13 0100)  . heparin 1,600 Units/hr (04/18/13 2032)   Assessment: 72 y/o M on heparin for afib, awaiting trach.  Heparin level remains slightly supratherapeutic this morning at 0.74. No bleeding noted. Heparin drip off at 0700 for trach.  Hgb has decreased to 8.5 from 9.5 yesterday. Pltc stable at 210K.   Goal of Therapy:  Heparin level 0.3-0.7 units/ml Monitor platelets by anticoagulation protocol: Yes   Plan:  -F/u restart of heparin after trach; will plan to restart heparin at 1500 units/hr -Check heparin level in 8 hours after restart of heparin (will f/u after trach) -Daily CBC/HL -Monitor for bleeding   Maxamillian Tienda C. Tanyia Grabbe, PharmD Clinical Pharmacist-Resident Pager: [phone] Pharmacy: [phone] 04/19/2013 11:38 AM  Addendum: Patient is now s/p trach and heparin is to continue for  STEMI. Heparin was resumed at ~1500 at 1500 units/hr. Will check 8 hr heparin level to ensure within therapeutic range. Pharmacy has also been consulted to dose warfarin for atrial fibrillation. Patient's coumadin score is 6.   Plan: -Continue heparin 1500 units/hr IV. Check 8 hr heparin level. -Give warfarin 7.5 mg PO x 1 dose tonight. -Check daily CBC, heparin level, INR, and signs of bleeding.  Edgardo Petrenko C. Eris Breck, PharmD Clinical Pharmacist-Resident Pager: [phone] Pharmacy: [phone] 04/19/2013 3:15 PM

## 2013-04-19 NOTE — Progress Notes (Signed)
   ENT Progress Note:  Procedure(s): TRACHEOSTOMY   Subjective: Pt stable on vent  Objective: Vital signs in last 24 hours: Temp:  [97.9 F (36.6 C)-100.2 F (37.9 C)] 98.1 F (36.7 C) (12/01 1100) Pulse Rate:  [72-104] 88 (12/01 1000) Resp:  [14-34] 14 (12/01 1100) BP: (100-182)/(59-105) 131/79 mmHg (12/01 1100) SpO2:  [97 %-100 %] 100 % (12/01 1100) FiO2 (%):  [30 %] 30 % (12/01 1100) Weight:  [69.5 kg (153 lb 3.5 oz)] 69.5 kg (153 lb 3.5 oz) (12/01 0450) Weight change: -3.8 kg (-8 lb 6 oz) Last BM Date: 04/19/13  Intake/Output from previous day: 11/30 0701 - 12/01 0700 In: 1790.2 [I.V.:580.2; NG/GT:1040; IV Piggyback:50] Out: 2075 [Urine:2075] Intake/Output this shift: Total I/O In: 100 [I.V.:40; Other:60] Out: 425 [Urine:425]  Labs:  Recent Labs  04/18/13 0545 04/19/13 0415  WBC 13.5* 15.7*  HGB 9.5* 8.5*  HCT 28.5* 25.0*  PLT 214 210    Recent Labs  04/18/13 0545 04/19/13 0415  NA 144 146*  K 3.9 3.8  CL 104 105  CO2 29 31  GLUCOSE 133* 85  BUN 24* 28*  CALCIUM 8.6 8.8    Studies/Results: Dg Chest Port 1 View  04/19/2013   CLINICAL DATA:  Endotracheal tube position.  EXAM: PORTABLE CHEST - 1 VIEW  COMPARISON:  April 18, 2013.  FINDINGS: Dextroscoliosis of upper thoracic spine is noted. Endotracheal tube is in grossly good position with distal tip approximately 5 cm above the carina. No pneumothorax is noted. Minimal left pleural effusion is seen. Stable cardiomegaly. Left retrocardiac opacity is noted and unchanged compared to prior exam. Right lung is clear.  IMPRESSION: Endotracheal tube in grossly good position. Stable left basilar opacity with associated minimal left pleural effusion.   Electronically Signed   By: Roque Lias M.D.   On: 04/19/2013 08:02   Dg Chest Port 1 View  04/18/2013   CLINICAL DATA:  Intubated  EXAM: PORTABLE CHEST - 1 VIEW  COMPARISON:  the previous day's study  FINDINGS: Endotracheal tube tip 7.4 cm above carina. 2.7  cm nodular opacity in the right upper lung stable. Bibasilar airspace opacities left greater than right as before. There may be a small left pleural effusion. The right lateral costophrenic angle is excluded. Mild cardiomegaly stable.  IMPRESSION: Little significant change from previous day's exam.   Electronically Signed   By: Oley Balm M.D.   On: 04/18/2013 07:34     PHYSICAL EXAM: OT intubation   Assessment/Plan: Plan tracheostomy under GA today.    [name] [name] 04/19/2013, 12:28 PM

## 2013-04-19 NOTE — Progress Notes (Signed)
    Subjective:  Awake and alert. Wife at bedside. Denies pain. Had trach today.  Objective:  Vital Signs in the last 24 hours: Temp:  [96.3 F (35.7 C)-100.2 F (37.9 C)] 97.5 F (36.4 C) (12/01 1700) Pulse Rate:  [60-104] 68 (12/01 1725) Resp:  [13-20] 14 (12/01 1725) BP: (100-182)/(53-105) 161/84 mmHg (12/01 1725) SpO2:  [96 %-100 %] 98 % (12/01 1725) FiO2 (%):  [30 %] 30 % (12/01 1725) Weight:  [153 lb 3.5 oz (69.5 kg)] 153 lb 3.5 oz (69.5 kg) (12/01 0450)  Intake/Output from previous day: 11/30 0701 - 12/01 0700 In: 1790.2 [I.V.:580.2; NG/GT:1040; IV Piggyback:50] Out: 2075 [Urine:2075]  Physical Exam: Pt is alert, NAD HEENT: normal Neck: JVP - difficult to visualize Lungs: coarse bilaterally CV: RRR without murmur or gallop Abd: soft, NT, Positive BS, no hepatomegaly Ext: no edema Skin: warm/dry no rash  Lab Results:  Recent Labs  04/18/13 0545 04/19/13 0415  WBC 13.5* 15.7*  HGB 9.5* 8.5*  PLT 214 210    Recent Labs  04/18/13 0545 04/19/13 0415  NA 144 146*  K 3.9 3.8  CL 104 105  CO2 29 31  GLUCOSE 133* 85  BUN 24* 28*  CREATININE 0.80 0.87   No results found for this basename: TROPONINI, CK, MB,  in the last 72 hours  Tele: Sinus rhythm, PVC's  Assessment/Plan:  1. Atrial fibrillation - on heparin. Coumadin started today. Maintaining sinus rhythm on amiodarone.  2. Acute on chronic respiratory failure. Now s/p trach. PCCM following.  3. CHF - acute on chronic diastolic. Transition to oral lasix.  4. H/o CVA's   Tonny Bollman, M.D. 04/19/2013, 6:08 PM

## 2013-04-19 NOTE — Anesthesia Postprocedure Evaluation (Signed)
  Anesthesia Post-op Note  Patient: [name] [name]  Procedure(s) Performed: Procedure(s): TRACHEOSTOMY (N/A)  Patient Location: ICU  Anesthesia Type:General  Level of Consciousness: sedated and Patient remains intubated per anesthesia plan  Airway and Oxygen Therapy: Patient connected to tracheostomy mask oxygen  Post-op Pain: none  Post-op Assessment: Post-op Vital signs reviewed, Patient's Cardiovascular Status Stable, Respiratory Function Stable, Patent Airway and Pain level controlled  Post-op Vital Signs: stable  Complications: No apparent anesthesia complications

## 2013-04-19 NOTE — Anesthesia Preprocedure Evaluation (Signed)
Anesthesia Evaluation  Patient identified by MRN, date of birth, ID band Patient awake    Reviewed: Allergy & Precautions, H&P , NPO status , Patient's Chart, lab work & pertinent test results  Airway   Neck ROM: Limited    Dental  (+) Teeth Intact and Poor Dentition   Pulmonary  + rhonchi         Cardiovascular hypertension, Rhythm:Regular Rate:Normal     Neuro/Psych    GI/Hepatic   Endo/Other    Renal/GU      Musculoskeletal   Abdominal   Peds  Hematology   Anesthesia Other Findings   Reproductive/Obstetrics                           Anesthesia Physical Anesthesia Plan  ASA: IV  Anesthesia Plan: General   Post-op Pain Management:    Induction: Intravenous  Airway Management Planned: Oral ETT and Tracheostomy  Additional Equipment:   Intra-op Plan:   Post-operative Plan: Post-operative intubation/ventilation  Informed Consent: I have reviewed the patients History and Physical, chart, labs and discussed the procedure including the risks, benefits and alternatives for the proposed anesthesia with the patient or authorized representative who has indicated his/her understanding and acceptance.     Plan Discussed with: CRNA, Anesthesiologist and Surgeon  Anesthesia Plan Comments:         Anesthesia Quick Evaluation

## 2013-04-19 NOTE — Procedures (Signed)
Interventional Radiology Procedure Note  Procedure: Placement of a right brachial vein dual lumen PowerPICC. Complications: None Recommendations: - Routine line care  Signed,  Sterling Big, MD [facility]

## 2013-04-19 NOTE — Brief Op Note (Signed)
04/09/2013 - 04/19/2013  1:31 PM  PATIENT:  [name] [name]  72 y.o. male  PRE-OPERATIVE DIAGNOSIS:  Acute Respiratory Failure  POST-OPERATIVE DIAGNOSIS:  Acute Respiratory Failure  PROCEDURE:  Procedure(s): TRACHEOSTOMY (N/A)  SURGEON:  Surgeon(s) and Role:    * Osborn Coho, MD - Primary  PHYSICIAN ASSISTANT:   ASSISTANTS: none   ANESTHESIA:   general  EBL:  Total I/O In: 500 [I.V.:440; Other:60] Out: 425 [Urine:425]  Min EBL  BLOOD ADMINISTERED:none  DRAINS: none   LOCAL MEDICATIONS USED:  LIDOCAINE  and Amount: 3 ml  SPECIMEN:  No Specimen  DISPOSITION OF SPECIMEN:  N/A  COUNTS:  YES  TOURNIQUET:  * No tourniquets in log *  DICTATION: .Other Dictation: Dictation Number [id number]  PLAN OF CARE: Admit to inpatient   PATIENT DISPOSITION:  PACU - hemodynamically stable.   Delay start of Pharmacological VTE agent (>24hrs) due to surgical blood loss or risk of bleeding: no

## 2013-04-20 LAB — CBC
Platelets: 246 10*3/uL (ref 150–400)
RBC: 2.88 MIL/uL — ABNORMAL LOW (ref 4.22–5.81)
RDW: 15.9 % — ABNORMAL HIGH (ref 11.5–15.5)
WBC: 15.8 10*3/uL — ABNORMAL HIGH (ref 4.0–10.5)

## 2013-04-20 LAB — GLUCOSE, CAPILLARY
Glucose-Capillary: 92 mg/dL (ref 70–99)
Glucose-Capillary: 88 mg/dL (ref 70–99)
Glucose-Capillary: 83 mg/dL (ref 70–99)
Glucose-Capillary: 105 mg/dL — ABNORMAL HIGH (ref 70–99)
Glucose-Capillary: 135 mg/dL — ABNORMAL HIGH (ref 70–99)
Glucose-Capillary: 121 mg/dL — ABNORMAL HIGH (ref 70–99)

## 2013-04-20 LAB — HEPARIN LEVEL (UNFRACTIONATED): Heparin Unfractionated: 0.6 IU/mL (ref 0.30–0.70)

## 2013-04-20 LAB — PROTIME-INR
INR: 1.4 (ref 0.00–1.49)
Prothrombin Time: 16.8 seconds — ABNORMAL HIGH (ref 11.6–15.2)

## 2013-04-20 MED ORDER — WARFARIN SODIUM 7.5 MG PO TABS
7.5000 mg | ORAL_TABLET | Freq: Once | ORAL | Status: AC
  Administered 2013-04-20: 7.5 mg via ORAL
  Filled 2013-04-20: qty 1

## 2013-04-20 NOTE — Op Note (Signed)
NAMESAI, ZINN            ACCOUNT NO.:  [id number]  MEDICAL RECORD NO.:  [id number]  LOCATION:  2H13C                        FACILITY:  [facility]  PHYSICIAN:  Kinnie Scales. Annalee Genta, M.D.DATE OF BIRTH:  1940-06-08  DATE OF PROCEDURE:  04/19/2013 DATE OF DISCHARGE:                              OPERATIVE REPORT   LOCATION:  [facility] Main OR.  PREOPERATIVE DIAGNOSIS:  Chronic ventilatory-dependent respiratory failure.  POSTOPERATIVE DIAGNOSIS:  Chronic ventilatory-dependent respiratory failure.  INDICATION FOR SURGERY:  Chronic ventilatory-dependent respiratory failure.  SURGICAL PROCEDURE:  Tracheostomy.  SURGEON:  Kinnie Scales. Annalee Genta, M.D.  COMPLICATIONS:  None.  ESTIMATED BLOOD LOSS:  Minimal.  The patient was transferred from the operating room to the recovery room in stable condition.  A #6 Shiley cuffed tracheostomy tube was inserted without difficulty.  BRIEF HISTORY:  The patient is a 72 year old, black male, admitted to [facility] with severe aspiration pneumonia and subsequent myocardial infarction.  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  Based on his body habitus, previous history and current concerns, ENT Service was consulted for placement of tracheostomy for airway management and ventilatory weaning.  The risks and benefits of the procedure were discussed in detail with the patient's wife who understood and concurred with our plan for surgery, which was scheduled on elective basis on April 19, 2013.  PROCEDURE:  The patient was brought to the operating room at [facility] on April 19, 2013, and placed in supine position on the operating table.  General endotracheal anesthesia was established via the patient's existing endotracheal tube.  He was then positioned on the operating table, and prepped and draped in a sterile fashion.  His neck was injected with 3 mL of 1%  lidocaine with 1:100,000 solution epinephrine, injected in the subcutaneous fashion in the anterior neck skin overlying the previous tracheostomy scar.  The patient was then positioned and prepped, and the surgical procedure was begun.  With the patient prepared for surgery, a 2-cm horizontally oriented incision was made in his preexisting scar.  This was carried through the skin and underlying subcutaneous tissue.  There was a moderate amount of scarring from his previous healed tracheostomy site.  The strap muscles were identified, divided in the midline and lateralized.  This allowed access to the anterior compartment of the neck.  The anterior trachea was carefully palpated.  The previous tracheostomy site was in the upper aspect of the trachea, approximately the second tracheal interspace, using this as a guide.  Soft tissue was elevated using blunt and sharp dissection and anterior tracheotomy was performed.  The patient's endotracheal tube was carefully withdrawn and a #6 Shiley cuffed tracheostomy tube was inserted without difficulty.  The patient's airway was stable and secured with good gas exchange and ventilation.  The tracheostomy tube was sutured in position with 2-0 Ethilon sutures and a Velcro tracheostomy tie.  The patient was then awakened from his anesthetic and returned to unit 2900 for further postoperative care.          ______________________________ Kinnie Scales. Annalee Genta, M.D.     DLS/MEDQ  D:  95/[id number]  T:  04/20/2013  Job:  [id number]

## 2013-04-20 NOTE — Progress Notes (Signed)
PULMONARY  / CRITICAL CARE MEDICINE  Name: [name] [name] MRN: [id number] DOB: August 28, 1940    ADMISSION DATE:  04/09/2013 CONSULTATION DATE:  04/09/2013  REFERRING MD :  Cardiology PRIMARY SERVICE: PCCM  CHIEF COMPLAINT:  Acute respiratory failure  BRIEF PATIENT DESCRIPTION: 72 y/o M with history of multiple CVA (aphasia, PEG) admitted with respiratory failure in setting of pneumonia and STEMI.  SIGNIFICANT EVENTS / STUDIES:  11/21 - Admitted with respiratory failure in setting of pneumonia and STEMI 11/21 - Cardiac cath >>> 95% occluded RCA, no intervention due to non dominant nature 11/22 - TTE >>> EF 45-50%, abnormal relaxation, mild aortic regurgitation 11/29 - Remains on vent, pending trach on 12/1 per ENT 12/01 - Tracheostomy by ENT completed  LINES / TUBES: Foley 11/21 >>> PEG ??? >>> R fem CVL 11/21 >>> out R fem A-Line 11/21 >>> out R PICC 12/1 >>>  CULTURES: 11/21 Blood >>> Neg 11/22 Sputum >>> WBCs, no organisms  ANTIBIOTICS: Azithromycin 11/21 >>>11/28 Ceftriaxone 11/21>>>11/30  SUBJECTIVE:  Ongoing low grade fevers, mild leukocytosis trending up.  Patient is awake and follow commands.  Nodding to doing ok.  Agitation intermittently with attempt to pull out ET tube.  VITAL SIGNS: Temp:  [96.3 F (35.7 C)-100.4 F (38 C)] 99.3 F (37.4 C) (12/02 0800) Pulse Rate:  [60-92] 85 (12/02 0824) Resp:  [9-29] 28 (12/02 0824) BP: (103-175)/(53-105) 170/99 mmHg (12/02 0824) SpO2:  [96 %-100 %] 99 % (12/02 0824) FiO2 (%):  [30 %] 30 % (12/02 0824) Weight:  [67.5 kg (148 lb 13 oz)] 67.5 kg (148 lb 13 oz) (12/02 0500)  HEMODYNAMICS:   VENTILATOR SETTINGS: Vent Mode:  [-] CPAP;PSV FiO2 (%):  [30 %] 30 % Set Rate:  [14 bmp] 14 bmp Vt Set:  [500 mL] 500 mL PEEP:  [5 cmH20] 5 cmH20 Pressure Support:  [8 cmH20] 8 cmH20 Plateau Pressure:  [18 cmH20-19 cmH20] 18 cmH20  INTAKE / OUTPUT: Intake/Output     12/01 0701 - 12/02 0700 12/02 0701 - 12/03 0700   I.V.  (mL/kg) 1070 (15.9) 35 (0.5)   Other 120    NG/GT 855 65   IV Piggyback     Total Intake(mL/kg) 2045 (30.3) 100 (1.5)   Urine (mL/kg/hr) 1180 (0.7) 60 (0.5)   Stool 20 (0)    Total Output 1200 60   Net +845 +40         PHYSICAL EXAMINATION: General:  Awake and in NAD on trach with wife at bedside Neuro:  Awake, alert, nonverbal due to intubation, follows commands, appropriate HEENT:  OETT, MM moist and pink Cardiovascular:  Irregular, no murmurs Lungs:  Bilateral air entry, rhonchi R>L Abdomen:  Soft, PEG site intact, bowel sounds present Musculoskeletal:  Torticollis, contractures, bilateral foot drop Skin:  Penile erosion, skin thin and shiny  LABS:  CBC  Recent Labs Lab 04/18/13 0545 04/19/13 0415 04/20/13 0430  WBC 13.5* 15.7* 15.8*  HGB 9.5* 8.5* 9.0*  HCT 28.5* 25.0* 28.1*  PLT 214 210 246   Coag's  Recent Labs Lab 04/18/13 0545 04/19/13 0415 04/20/13 0430  INR 1.28 1.22 1.40   BMET  Recent Labs Lab 04/17/13 0540 04/18/13 0545 04/19/13 0415  NA 144 144 146*  K 3.5 3.9 3.8  CL 106 104 105  CO2 29 29 31   BUN 22 24* 28*  CREATININE 0.81 0.80 0.87  GLUCOSE 132* 133* 85   Electrolytes  Recent Labs Lab 04/16/13 0400 04/17/13 0540 04/18/13 0545 04/19/13 0415  CALCIUM 8.6 8.8  8.6 8.8  MG 1.9  --   --  2.0  PHOS 3.0  --   --  3.9   Sepsis Markers No results found for this basename: LATICACIDVEN, PROCALCITON, O2SATVEN,  in the last 168 hours ABG  Recent Labs Lab 04/16/13 0421 04/19/13 0435  PHART 7.444 7.459*  PCO2ART 42.4 48.6*  PO2ART 161.0* 117.0*   Liver Enzymes  Recent Labs Lab 04/18/13 0545  AST 49*  ALT 77*  ALKPHOS 85  BILITOT 0.3  ALBUMIN 2.2*   Cardiac Enzymes No results found for this basename: TROPONINI, PROBNP,  in the last 168 hours Glucose  Recent Labs Lab 04/19/13 1216 04/19/13 1617 04/19/13 1936 04/20/13 0012 04/20/13 0342 04/20/13 0801  GLUCAP 88 83 105* 132* 111* 139*   CXR:  11/29>>>  Cardiomegaly. Bilateral airspace opacities, left slightly greater than right may reflect asymmetric  edema or infection.   ASSESSMENT / PLAN:  PULMONARY A:  Acute respiratory failure in setting of pneumonia (CAP vs aspiration)  --> Failed wean  --> s/p Tracheostomy 04/19/13 Possibly element of pulmonary edema in setting of VT. Pulmonary nodule RUL.  P:   - Allow PSV 5-10, goal to trach collar attempts today - Tracheostomy complete, on CPAP at FiO2 of 30% - CT chest once acute issues resolved to clarify nodule  CARDIOVASCULAR A:  VT resolved with defibrillation.  STEMI s/p cardiac cath without intervention.  H/o CAD, HTN, AF. 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 Atrial Fibrillation  P:  - Cardiology following. - Amiodarone an in rhythm control - Heparin drip and Coumadin bridging per pharmacy - ASA, Plavix, Lipitor, Metoprolol.  RENAL A:  AKI / oliguria - improving   Acquired hypospadius, chronic Mild hypernatremia  P:   - Trend BMP - Appreciate Dr Belva Crome eval; hypospadius does not appear infected. - Urology will decide disposition for catheter. - Will have to wait for suprapubic cath until critical illness resolved.  GASTROINTESTINAL A:   Dysphagia s/p PEG.  P:   - TF per Nutrition. - Protonix for GI Px.  HEMATOLOGIC A:   Hemodilution.   Therapeutic anticoagulation for ACS - NOT chronically anticoagulated for AF (reportedly).  P:  - Trend CBC. - Heparin drip and Coumadin bridging  INFECTIOUS A:   Pneumonia (aspiration vs CAP), PCT 92 Mild Trending Up Leukocytosis  - Pleural effusion on CXR, ?parapneumonic?  P:   - Monitor fever curve and WBCs, remains stable  ENDOCRINE A:   Hyperglycemia.  P:   - SSI.  NEUROLOGIC A:   Encephalopathy H/o multiple CVAs with significant residual deficit Contractures  P:   - Goal RASS 0 to -1 - Fentanyl PRN. - Foot drop boots.  Care during the described time interval was provided by me and/or  other providers on the critical care team.  I have reviewed this patient's available data, including medical history, events of note, physical examination and test results as part of my evaluation  CC time 30 min.  Mcarthur Rossetti. Tyson Alias, MD, FACP Pgr: [phone] [place] [facility]

## 2013-04-20 NOTE — Progress Notes (Signed)
Clinical Social Work Department BRIEF PSYCHOSOCIAL ASSESSMENT 04/20/2013  Patient:  [name] [name],[name] [name]     Account Number:  [id number]     Admit date:  04/09/2013  Clinical Social Worker:  Varney Biles  Date/Time:  04/20/2013 01:19 PM  Referred by:  Physician  Date Referred:  04/20/2013 Referred for  SNF Placement   Other Referral:   Interview type:  Family Other interview type:    PSYCHOSOCIAL DATA Living Status:  FAMILY Admitted from facility:   Level of care:   Primary support name:  [name] [name] Primary support relationship to patient:  SPOUSE Degree of support available:   Good--Pt's wife of 50 years at bedside.    CURRENT CONCERNS Current Concerns  Post-Acute Placement   Other Concerns:    SOCIAL WORK ASSESSMENT / PLAN CSW introduced self and CSW role. Pt's wife of 50 years at bedside, holding pt's hand. CSW explained that because pt is on vent, he will need vent SNF if unable to wean to trach. Pt's wife states pt is on trach--CSW explained that trach SNFs are more prevalent and there are more options for this. CSW left room and checked chart, finding pt is not completely weaned from trach yet--CSW will go back to pt's wife and explain that if he cannot wean from vent, he will need vent SNF. CSW sent clinicals to [facility] in [place], as they can handle pts on trachs & vents. CSW waiting on response from [facility]. If pt weans from vent, CSW will send clinicals to trach SNFs.   Assessment/plan status:  Psychosocial Support/Ongoing Assessment of Needs Other assessment/ plan:   Information/referral to community resources:   SNF(vent or trach depending on ability to wean from vent).    PATIENT'S/FAMILY'S RESPONSE TO PLAN OF CARE: Pt's wife receptive to CSW visit but expressed that she does not want to drive any further than she does currently to see pt in hospital. Wife expressed that she wants to keep pt here in the hospital--CSW explained the nature of  acute care and that the hospital gets pts to a safe position to be cared for in a longer-term facility, like SNF. Pt's wife nodded but did not verbalize understanding of this, talking about hesitation concerning driving from home to new facility. Pt's wife had questions about home health, and CSW explained that medical team does not recommend this unless she has at least two people who could be at home with pt and provide 24/7 care--wife says she does not have this but is still interested in hearing about home health. CSW alerted RNCM.       Maryclare Labrador, MSW, [facility] Clinical Social Worker [phone]

## 2013-04-20 NOTE — Progress Notes (Signed)
ANTICOAGULATION CONSULT NOTE -  Follow Up Consult for Heparin and coumadin  Pharmacy Consult for Heparin/coumadin  Indication: STEMI/atrial fibrillation  No Known Allergies  Patient Measurements: Height: 5\' 6"  (167.6 cm) Weight: 148 lb 13 oz (67.5 kg) IBW/kg (Calculated) : 63.8  Vital Signs: Temp: 99.7 F (37.6 C) (12/02 1000) BP: 136/75 mmHg (12/02 1000) Pulse Rate: 85 (12/02 1000)  Labs:  Recent Labs  04/18/13 0545  04/19/13 0415 04/19/13 2300 04/20/13 0430  HGB 9.5*  --  8.5*  --  9.0*  HCT 28.5*  --  25.0*  --  28.1*  PLT 214  --  210  --  246  LABPROT 15.7*  --  15.1  --  16.8*  INR 1.28  --  1.22  --  1.40  HEPARINUNFRC 1.35*  < > 0.74* 0.56 0.60  CREATININE 0.80  --  0.87  --   --   < > = values in this interval not displayed. Estimated Creatinine Clearance: 70.3 ml/min (by C-G formula based on Cr of 0.87).  Medications:  . sodium chloride 10 mL/hr at 04/15/13 1900  . sodium chloride Stopped (04/12/13 1400)  . sodium chloride 10 mL/hr (04/12/13 2009)  . feeding supplement (JEVITY 1.2 CAL) 1,000 mL (04/20/13 0400)  . heparin 1,500 Units/hr (04/20/13 0224)   Assessment: 72 y/o M on heparin for STEMI/afib, s/p trach 12/1.  Heparin to continue as bridge with coumadin which was started last night (12/1).   Heparin level remains therapeutic this morning at 0.60.  INR is subtherapeutic at 1.40 after one 7.5mg  dose of warfarin. Patient's coumadin score is 6.  No bleeding noted. Hgb fairly stable at 9, pltc stable at 246K.   Of note, patient is also on amiodarone which can increase warfarin levels. This potential drug interaction will continue to be monitored.  Goal of Therapy:  Heparin level 0.3-0.7 units/ml Monitor platelets by anticoagulation protocol: Yes   Plan:  -Give warfarin 7.5 mg PO x 1 dose tonight. -Continue heparin at 1500 units/hr -Daily CBC/HL, INR -Monitor for bleeding   Hollace Michelli C. Veldon Wager, PharmD Clinical Pharmacist-Resident Pager:  [phone] Pharmacy: [phone] 04/20/2013 10:50 AM

## 2013-04-20 NOTE — Progress Notes (Signed)
Clinical Social Work Department CLINICAL SOCIAL WORK PLACEMENT NOTE 04/20/2013  Patient:  [name] [name],[name] [name]  Account Number:  [id number] Admit date:  04/09/2013  Clinical Social Worker:  Maryclare Labrador, Theresia Majors  Date/time:  04/20/2013 03:46 PM  Clinical Social Work is seeking post-discharge placement for this patient at the following level of care:   SKILLED NURSING   (*CSW will update this form in Epic as items are completed)   04/20/2013  Patient/family provided with [facility] Department of Clinical Social Work's list of facilities offering this level of care within the geographic area requested by the patient (or if unable, by the patient's family).  04/20/2013  Patient/family informed of their freedom to choose among providers that offer the needed level of care, that participate in Medicare, Medicaid or managed care program needed by the patient, have an available bed and are willing to accept the patient.  04/20/2013  Patient/family informed of [facility]' ownership interest in [facility], as well as of the fact that they are under no obligation to receive care at this facility.  PASARR submitted to EDS on EXISTING PASARR number received from EDS on EXISTING  FL2 transmitted to all facilities in geographic area requested by pt/family on  04/20/2013 (clinicals hard faxed to [facility])  FL2 transmitted to all facilities within larger geographic area on   Patient informed that his/her managed care company has contracts with or will negotiate with  certain facilities, including the following:     Patient/family informed of bed offers received:   Patient chooses bed at  Physician recommends and patient chooses bed at    Patient to be transferred to  on   Patient to be transferred to facility by   The following physician request were entered in Epic:   Additional Comments:

## 2013-04-20 NOTE — Progress Notes (Signed)
NUTRITION FOLLOW UP  Intervention:   1.  Enteral nutrition; continue TF of Jevity 1.2 via OGT @  38ml/hr which will provide 1872 calories, 86g protein, free water and meet 101% estimated calorie needs, 102% estimated protein needs.  Pt has reached goal.  Nutrition Dx:   Inadequate oral intake related to inability to eat, Ongoing  Goal:   1. Enteral nutrition; initiation with tolerance. Pt to meet >/=90% estimated needs with nutrition support - not met, but in process of starting TF 2. Wt/wt change; monitor trends - significant weight drop documented  Monitor:   Weights, labs, vent status, TF initiation/tolerance/advancement  Assessment:   Pt admitted with NSTEMI. 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 He has some ability to communication per wife, however is not verbal.  Home regimen is: 1 can Osmolite 1.2 + 1 can Jevity 1.2 in the mornings for a total of 2 cans daily. In addition, pt consumes meals and snacks throughout the day. Wife prepares foods soft and bland, and mashes them into a soft bites. Pt can chew and swallow well at baseline.   Patient is currently intubated on ventilator support.  MV: 8.8 L/min Temp:Temp (24hrs), Avg:98.7 F (37.1 C), Min:96.3 F (35.7 C), Max:100.4 F (38 C)  S/p trach placement.  TFs have been resumed.   Residuals:  0-5 mL  Height: Ht Readings from Last 1 Encounters:  04/09/13 5\' 6"  (1.676 m)    Weight Status:   Wt Readings from Last 1 Encounters:  04/20/13 148 lb 13 oz (67.5 kg)    Re-estimated needs:  Kcal: 1847 Protein: 84-98g Fluid: 1.8 L/day  Skin: neck incision  Diet Order: NPO   Intake/Output Summary (Last 24 hours) at 04/20/13 0902 Last data filed at 04/20/13 0800  Gross per 24 hour  Intake   2145 ml  Output    960 ml  Net   1185 ml    Last BM: 12/1  Labs:   Recent Labs Lab 04/15/13 0454 04/16/13 0400 04/17/13 0540 04/18/13 0545 04/19/13 0415  NA 143 145 144 144 146*  K  5.5* 4.0 3.5 3.9 3.8  CL 109 108 106 104 105  CO2 24 27 29 29 31   BUN 19 19 22  24* 28*  CREATININE 0.91 0.95 0.81 0.80 0.87  CALCIUM 8.4 8.6 8.8 8.6 8.8  MG  --  1.9  --   --  2.0  PHOS  --  3.0  --   --  3.9  GLUCOSE 121* 130* 132* 133* 85    CBG (last 3)   Recent Labs  04/20/13 0012 04/20/13 0342 04/20/13 0801  GLUCAP 132* 111* 139*    Scheduled Meds: . amiodarone  400 mg Per Tube Daily  . antiseptic oral rinse  15 mL Mouth Rinse QID  . aspirin  81 mg Per J Tube Daily  . atorvastatin  80 mg Per Tube q1800  . carvedilol  12.5 mg Per Tube BID WC  . chlorhexidine  15 mL Mouth Rinse BID  . furosemide  40 mg Oral Daily  . insulin aspart  0-15 Units Subcutaneous Q4H  . lisinopril  20 mg Oral Daily  . oxybutynin  5 mg Per Tube Daily  . pantoprazole sodium  40 mg Per Tube Daily  . Warfarin - Pharmacist Dosing Inpatient   Does not apply q1800    Continuous Infusions: . sodium chloride 10 mL/hr at 04/15/13 1900  . sodium chloride Stopped (04/12/13 1400)  .  sodium chloride 10 mL/hr (04/12/13 2009)  . feeding supplement (JEVITY 1.2 CAL) 1,000 mL (04/20/13 0400)  . heparin 1,500 Units/hr (04/20/13 0224)    Loyce Dys, MS RD LDN Clinical Inpatient Dietitian Pager: [phone] Weekend/After hours pager: [phone]

## 2013-04-21 ENCOUNTER — Encounter (HOSPITAL_COMMUNITY): Payer: Self-pay | Admitting: Otolaryngology

## 2013-04-21 ENCOUNTER — Inpatient Hospital Stay (HOSPITAL_COMMUNITY): Payer: Medicare Other

## 2013-04-21 LAB — CBC
HCT: 26.8 % — ABNORMAL LOW (ref 39.0–52.0)
Hemoglobin: 8.8 g/dL — ABNORMAL LOW (ref 13.0–17.0)
MCV: 96.8 fL (ref 78.0–100.0)
Platelets: 260 10*3/uL (ref 150–400)
RBC: 2.77 MIL/uL — ABNORMAL LOW (ref 4.22–5.81)
WBC: 14.8 10*3/uL — ABNORMAL HIGH (ref 4.0–10.5)

## 2013-04-21 LAB — GLUCOSE, CAPILLARY
Glucose-Capillary: 121 mg/dL — ABNORMAL HIGH (ref 70–99)
Glucose-Capillary: 122 mg/dL — ABNORMAL HIGH (ref 70–99)
Glucose-Capillary: 126 mg/dL — ABNORMAL HIGH (ref 70–99)
Glucose-Capillary: 128 mg/dL — ABNORMAL HIGH (ref 70–99)

## 2013-04-21 LAB — BASIC METABOLIC PANEL
BUN: 24 mg/dL — ABNORMAL HIGH (ref 6–23)
CO2: 32 mEq/L (ref 19–32)
Glucose, Bld: 121 mg/dL — ABNORMAL HIGH (ref 70–99)
Potassium: 3.5 mEq/L (ref 3.5–5.1)
Sodium: 145 mEq/L (ref 135–145)

## 2013-04-21 LAB — HEPARIN LEVEL (UNFRACTIONATED): Heparin Unfractionated: 0.55 IU/mL (ref 0.30–0.70)

## 2013-04-21 MED ORDER — POTASSIUM CHLORIDE 20 MEQ/15ML (10%) PO LIQD
20.0000 meq | Freq: Two times a day (BID) | ORAL | Status: DC
  Administered 2013-04-21 – 2013-04-23 (×5): 20 meq via ORAL
  Filled 2013-04-21 (×6): qty 15

## 2013-04-21 MED ORDER — WARFARIN SODIUM 7.5 MG PO TABS
7.5000 mg | ORAL_TABLET | Freq: Once | ORAL | Status: AC
  Administered 2013-04-21: 7.5 mg via ORAL
  Filled 2013-04-21: qty 1

## 2013-04-21 MED ORDER — FUROSEMIDE 40 MG PO TABS
40.0000 mg | ORAL_TABLET | Freq: Two times a day (BID) | ORAL | Status: DC
  Administered 2013-04-21 – 2013-04-22 (×2): 40 mg via ORAL
  Filled 2013-04-21 (×4): qty 1

## 2013-04-21 NOTE — Progress Notes (Signed)
CSW called MD and asked about progress concerning wean to trach. MD states attempts have failed, and pt likely will need vent SNF. Efforts will continue to wean pt from trach. CSW spoke with pt's wife in pt's room and explained that it appears, if pt is unable to wean to trach, that pt will need vent SNF. CSW explained that there are a few facilities in [place], but these are typically full. CSW will send clinicals to these facilities, but also explained that the closest facility that typically can take pts is in [place]. Pt's wife adamant that this is too far away, saying she will not send him out of [place]. CSW asked pt if she has family in other states that CSW could look in for vent SNF, and pt states again that she will not send him. CSW provided support and explained that CSW will continue to collaborate with the medical team to provide CSW services that facilitate the most appropriate discharge.    Maryclare Labrador, MSW, [facility] Clinical Social Worker [phone]

## 2013-04-21 NOTE — Progress Notes (Signed)
S.p trach 04/19/13 Now on IV heparin  RN calls saying patient had ping pong sized hemoptysis clot  [facility] exam  - bleeding around trach and into vent circuit   Recent Labs Lab 04/17/13 0540 04/18/13 0545 04/19/13 0415 04/20/13 0430 04/21/13 0405  HGB 9.9* 9.5* 8.5* 9.0* 8.8*     Plan Stat heparin level Dc heparin stat cxr stat  Dr. Kalman Shan, M.D., [facility].C.P Pulmonary and Critical Care Medicine Staff Physician [facility] Pager: [phone], If no answer or between  15:00h - 7:00h: call [phone]  04/21/2013 11:50 PM

## 2013-04-21 NOTE — Progress Notes (Signed)
ANTICOAGULATION CONSULT NOTE - Follow Up   Pharmacy Consult for heparin/coumadin  Indication: STEMI/atrial fibrillation  No Known Allergies  Patient Measurements: Height: 5\' 6"  (167.6 cm) Weight: 156 lb 15.5 oz (71.2 kg) IBW/kg (Calculated) : 63.8  Vital Signs: Temp: 98.9 F (37.2 C) (12/03 0902) Temp src: Oral (12/03 0902) BP: 167/89 mmHg (12/03 0902) Pulse Rate: 88 (12/03 0902)  Labs:  Recent Labs  04/19/13 0415 04/19/13 2300 04/20/13 0430 04/21/13 0405  HGB 8.5*  --  9.0* 8.8*  HCT 25.0*  --  28.1* 26.8*  PLT 210  --  246 260  LABPROT 15.1  --  16.8* 18.4*  INR 1.22  --  1.40 1.58*  HEPARINUNFRC 0.74* 0.56 0.60 0.55  CREATININE 0.87  --   --   --    Estimated Creatinine Clearance: 70.3 ml/min (by C-G formula based on Cr of 0.87).  Medications:  . sodium chloride 10 mL/hr at 04/15/13 1900  . sodium chloride Stopped (04/12/13 1400)  . sodium chloride 10 mL/hr (04/12/13 2009)  . feeding supplement (JEVITY 1.2 CAL) 1,000 mL (04/21/13 0616)  . heparin 1,500 Units/hr (04/20/13 1734)   Assessment: 72 y/o M on heparin for STEMI/afib, s/p trach 12/1.  Heparin to continue as bridge with coumadin, overlap day#3.  Heparin level remains therapeutic this morning at 0.55. INR is subtherapeutic at 1.58 after two daily 7.5mg  doses of warfarin, but is trending up.  Of note, patient is also on amiodarone which can increase warfarin levels. This potential drug interaction will continue to be monitored.   Goal of Therapy:  Heparin level 0.3-0.7 units/ml Monitor platelets by anticoagulation protocol: Yes    Plan:  - repeat warfarin 7.5 mg PO x1 dose tonight. - continue heparin at 1500 units/hr - daily CBC/HL, INR - monitor for s/s of bleeding  Harrold Donath E. Achilles Dunk, PharmD Clinical Pharmacist - Resident Pager: [phone] Pharmacy: [phone] 04/21/2013 9:21 AM

## 2013-04-21 NOTE — Progress Notes (Signed)
Pt having runs of Vtach. 6 and now 33 beats. Pt asymptomatic- asleep and VS stable. Dr. Jon Billings notified and aware.

## 2013-04-21 NOTE — Progress Notes (Addendum)
CSW and RNCM spoke with pt's wife at bedside yesterday, explaining that if pt is unable to wean from vent to trach he will need vent SNF. CSW and RNCM explained that vent SNFs are limited and that the closest facility to take pts due to bed limitation is in [place], [place]. Wife states that she does not want to travel any further than she does currently to see her husband in the hospital, and states "that's not happening" when CSW and RNCM discuss out-of-state vent SNF. CSW explained that if pt is able to wean completely from vent and can be on trach alone, he can go to trach SNF and that options for trach SNF are not as limited. CSW explained that there are facilities very close to [facility] and pt's wife more agreeable to this but still expressed hesitation about moving pt. CSW and RNCM to talk with MD about pt's disposition and likelihood of weaning from vent. CSW continues to follow case and will provide education and facilitate appropriate discharge depending on pt's medical needs.   Maryclare Labrador, MSW, [facility] Clinical Social Worker [phone]

## 2013-04-21 NOTE — Progress Notes (Signed)
PULMONARY  / CRITICAL CARE MEDICINE  Name: [name] [name] MRN: [id number] DOB: June 17, 1940    ADMISSION DATE:  04/09/2013 CONSULTATION DATE:  04/09/2013  REFERRING MD :  Cardiology PRIMARY SERVICE: PCCM  CHIEF COMPLAINT:  Acute respiratory failure  BRIEF PATIENT DESCRIPTION: 72 y/o M with history of multiple CVA (aphasia, PEG) admitted with respiratory failure in setting of pneumonia and STEMI.  SIGNIFICANT EVENTS / STUDIES:  11/21 - Admitted with respiratory failure in setting of pneumonia and STEMI 11/21 - Cardiac cath >>> 95% occluded RCA, no intervention due to non dominant nature 11/22 - TTE >>> EF 45-50%, abnormal relaxation, mild aortic regurgitation 11/29 - Remains on vent, pending trach on 12/1 per ENT 12/01 - Tracheostomy by ENT completed  LINES / TUBES: Foley 11/21 >>> PEG ??? >>> R fem CVL 11/21 >>> out R fem A-Line 11/21 >>> out R PICC 12/1 >>>  CULTURES: 11/21 Blood >>> Neg 11/22 Sputum >>> WBCs, no organisms  ANTIBIOTICS: Azithromycin 11/21 >>>11/28 Ceftriaxone 11/21>>>11/30  SUBJECTIVE:  Patient awake and following commands.  Not agitated.  Has not been able to wean to trach collar.   VITAL SIGNS: Temp:  [98.4 F (36.9 C)-100.2 F (37.9 C)] 98.9 F (37.2 C) (12/03 0902) Pulse Rate:  [67-88] 88 (12/03 0902) Resp:  [11-34] 27 (12/03 0902) BP: (133-170)/(62-89) 158/76 mmHg (12/03 1042) SpO2:  [99 %-100 %] 99 % (12/03 0902) FiO2 (%):  [30 %] 30 % (12/03 0755) Weight:  [156 lb 15.5 oz (71.2 kg)] 156 lb 15.5 oz (71.2 kg) (12/03 0431)  HEMODYNAMICS:   VENTILATOR SETTINGS: Vent Mode:  [-] PRVC FiO2 (%):  [30 %] 30 % Set Rate:  [14 bmp] 14 bmp Vt Set:  [500 mL] 500 mL PEEP:  [5 cmH20] 5 cmH20 Pressure Support:  [8 cmH20] 8 cmH20 Plateau Pressure:  [12 cmH20-17 cmH20] 17 cmH20  INTAKE / OUTPUT: Intake/Output     12/02 0701 - 12/03 0700 12/03 0701 - 12/04 0700   I.V. (mL/kg) 715 (10)    Other     NG/GT 1365    Total Intake(mL/kg) 2080 (29.2)     Urine (mL/kg/hr) 660 (0.4) 150 (0.5)   Stool     Total Output 660 150   Net +1420 -150         PHYSICAL EXAMINATION: General:  Awake and in NAD on trach resting comfortably Neuro:  Awake, alert, follows commands, appropriate HEENT:  Trach site has dried blood around site and minimal secretions, MM moist and pink Cardiovascular:  S1S2, no murmurs Lungs:  Bilateral air entry, few scattered rhonchi Abdomen:  Soft, PEG site intact, bowel sounds present Musculoskeletal:  Torticollis, contractures, bilateral foot drop with boots on Skin:  Penile erosion, skin thin and shiny  LABS:  CBC  Recent Labs Lab 04/19/13 0415 04/20/13 0430 04/21/13 0405  WBC 15.7* 15.8* 14.8*  HGB 8.5* 9.0* 8.8*  HCT 25.0* 28.1* 26.8*  PLT 210 246 260   Coag's  Recent Labs Lab 04/19/13 0415 04/20/13 0430 04/21/13 0405  INR 1.22 1.40 1.58*   BMET  Recent Labs Lab 04/17/13 0540 04/18/13 0545 04/19/13 0415  NA 144 144 146*  K 3.5 3.9 3.8  CL 106 104 105  CO2 29 29 31   BUN 22 24* 28*  CREATININE 0.81 0.80 0.87  GLUCOSE 132* 133* 85   Electrolytes  Recent Labs Lab 04/16/13 0400 04/17/13 0540 04/18/13 0545 04/19/13 0415  CALCIUM 8.6 8.8 8.6 8.8  MG 1.9  --   --  2.0  PHOS 3.0  --   --  3.9   Sepsis Markers No results found for this basename: LATICACIDVEN, PROCALCITON, O2SATVEN,  in the last 168 hours ABG  Recent Labs Lab 04/16/13 0421 04/19/13 0435  PHART 7.444 7.459*  PCO2ART 42.4 48.6*  PO2ART 161.0* 117.0*   Liver Enzymes  Recent Labs Lab 04/18/13 0545  AST 49*  ALT 77*  ALKPHOS 85  BILITOT 0.3  ALBUMIN 2.2*   Cardiac Enzymes No results found for this basename: TROPONINI, PROBNP,  in the last 168 hours Glucose  Recent Labs Lab 04/20/13 1147 04/20/13 1729 04/20/13 2028 04/21/13 0052 04/21/13 0428 04/21/13 0817  GLUCAP 135* 134* 121* 121* 122* 134*   CXR:  12/1>>> Cardiomegaly. Bilateral airspace opacities, left slightly greater than right with  possible left pleural effusion ASSESSMENT / PLAN:  PULMONARY A:  Acute respiratory failure in setting of pneumonia (CAP vs aspiration)  --> Failed wean  --> s/p Tracheostomy 04/19/13 Possibly element of pulmonary edema in setting of VT. Pulmonary nodule RUL.  P:   - On CPAP at FiO2 of 30% - Allow PSV 5-10, goal to trach collar attempts today-failed, back to PS - Repeat CXR due to inability to wean well, likley need to increase lasix - CT chest once acute issues resolved to clarify nodule  CARDIOVASCULAR A:  VT resolved with defibrillation.  STEMI s/p cardiac cath without intervention.  H/o CAD, HTN, AF. 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 Atrial Fibrillation  P:  - Cardiology following. - Amiodarone for rhythm control - Coreg - Hydralazine prn - Increase Lasix 40 mg BID as remains net positive over past two days - Heparin drip and Coumadin bridging per pharmacy - ASA, Plavix, Lipitor, Metoprolol.  RENAL A:  AKI / oliguria - improving   Acquired hypospadius, chronic Appreciate Dr Belva Crome eval; hypospadius does not appear infected. - Urology will decide disposition for catheter. - Will have to wait for suprapubic cath until critical illness resolved.  Mild hypernatremia  P:   - Trend BMP - Increase lasix to BID - Replace K in setting of increased lasix dosing - May need to increase free water in setting of increased lasix   GASTROINTESTINAL A:   Dysphagia s/p PEG.  P:   - TF per Nutrition. - Protonix for GI Px.  HEMATOLOGIC A:   Hemodilution.   Therapeutic anticoagulation for ACS - NOT chronically anticoagulated for AF (reportedly).  P:  - Trend CBC. - Heparin drip and Coumadin bridging  INFECTIOUS A:   Pneumonia (aspiration vs CAP), PCT 92 Mild Trending Up Leukocytosis - trending down 12/3  - Pleural effusion on CXR, ?parapneumonic?  P:   - Monitor fever curve and WBCs, remains stable - No abx at this time  ENDOCRINE A:    Hyperglycemia.  P:   - SSI.  NEUROLOGIC A:   Encephalopathy H/o multiple CVAs with significant residual deficit Contractures  P:   - Goal RASS 0 to -1 - Fentanyl PRN. - Foot drop boots. -PT re eval  Terri Piedra [facility] Physician Assistant Student 04/21/13, 11:36 AM  Today's summary:  Patient has not been weaning well and had a run of vtach this am.  Cards is following.  Question of whether VT and pulmonary edema.  Will get a repeat CXR. Patient has some likely pulmonary edema on old films.  Patient net 2L positive over the past two days.  Will go ahead and increase lasix and replete K now.  May need to increase free  water in the setting of hypernatremia and lasix use.    I have fully examined this patient and agree with above findings.     Mcarthur Rossetti. Tyson Alias, MD, FACP Pgr: [phone] [facility]

## 2013-04-22 LAB — CBC
MCH: 30.9 pg (ref 26.0–34.0)
MCHC: 32.4 g/dL (ref 30.0–36.0)
MCHC: 31.7 g/dL (ref 30.0–36.0)
MCV: 97.5 fL (ref 78.0–100.0)
Platelets: 234 10*3/uL (ref 150–400)
Platelets: 233 10*3/uL (ref 150–400)
RDW: 16.1 % — ABNORMAL HIGH (ref 11.5–15.5)
WBC: 15.3 10*3/uL — ABNORMAL HIGH (ref 4.0–10.5)
WBC: 13.6 10*3/uL — ABNORMAL HIGH (ref 4.0–10.5)

## 2013-04-22 LAB — PROTIME-INR: Prothrombin Time: 20.8 seconds — ABNORMAL HIGH (ref 11.6–15.2)

## 2013-04-22 LAB — GLUCOSE, CAPILLARY
Glucose-Capillary: 116 mg/dL — ABNORMAL HIGH (ref 70–99)
Glucose-Capillary: 126 mg/dL — ABNORMAL HIGH (ref 70–99)
Glucose-Capillary: 118 mg/dL — ABNORMAL HIGH (ref 70–99)
Glucose-Capillary: 123 mg/dL — ABNORMAL HIGH (ref 70–99)
Glucose-Capillary: 121 mg/dL — ABNORMAL HIGH (ref 70–99)

## 2013-04-22 LAB — APTT: aPTT: 33 seconds (ref 24–37)

## 2013-04-22 LAB — BASIC METABOLIC PANEL
BUN: 27 mg/dL — ABNORMAL HIGH (ref 6–23)
Calcium: 8.5 mg/dL (ref 8.4–10.5)
Chloride: 105 mEq/L (ref 96–112)
Creatinine, Ser: 0.82 mg/dL (ref 0.50–1.35)
GFR calc Af Amer: >90 mL/min (ref >90)
GFR calc non Af Amer: 87 mL/min — ABNORMAL LOW (ref >90)

## 2013-04-22 LAB — ABO/RH: ABO/RH(D): B POS

## 2013-04-22 LAB — HEPARIN LEVEL (UNFRACTIONATED): Heparin Unfractionated: 0.46 IU/mL (ref 0.30–0.70)

## 2013-04-22 MED ORDER — SODIUM CHLORIDE 0.9 % IN NEBU
3.0000 mL | INHALATION_SOLUTION | RESPIRATORY_TRACT | Status: DC | PRN
  Filled 2013-04-22: qty 3

## 2013-04-22 MED ORDER — RACEPINEPHRINE HCL 2.25 % IN NEBU
0.5000 mL | INHALATION_SOLUTION | RESPIRATORY_TRACT | Status: DC | PRN
  Filled 2013-04-22: qty 0.5

## 2013-04-22 MED ORDER — LISINOPRIL 20 MG PO TABS
20.0000 mg | ORAL_TABLET | Freq: Every day | ORAL | Status: DC
  Administered 2013-04-23 – 2013-04-30 (×8): 20 mg
  Filled 2013-04-22 (×8): qty 1

## 2013-04-22 MED ORDER — FUROSEMIDE 40 MG PO TABS
40.0000 mg | ORAL_TABLET | Freq: Every day | ORAL | Status: DC
  Administered 2013-04-22 – 2013-04-30 (×9): 40 mg via ORAL
  Filled 2013-04-22 (×10): qty 1

## 2013-04-22 NOTE — Progress Notes (Signed)
eLink Physician-Brief Progress Note Patient Name: [name] DOB: 1940-05-25 MRN: [id number]  Date of Service  04/22/2013   HPI/Events of Note  Bleeding contines from trach site even after hep held Cbc awaited, pcxr reviewed    eICU Interventions  ffp   Intervention Category Intermediate Interventions: Bleeding - evaluation and treatment with blood products  Anielle Headrick J. 04/22/2013, 1:19 AM

## 2013-04-22 NOTE — Progress Notes (Addendum)
PULMONARY  / CRITICAL CARE MEDICINE  Name: [name] [name] MRN: [id number] DOB: Nov 17, 1940    ADMISSION DATE:  04/09/2013 CONSULTATION DATE:  04/09/2013  REFERRING MD :  Cardiology PRIMARY SERVICE: PCCM  CHIEF COMPLAINT:  Acute respiratory failure  BRIEF PATIENT DESCRIPTION: 72 y/o M with history of multiple CVA (aphasia, PEG) admitted with respiratory failure in setting of pneumonia and STEMI.  SIGNIFICANT EVENTS / STUDIES:  11/21 - Admitted with respiratory failure in setting of pneumonia and STEMI 11/21 - Cardiac cath >>> 95% occluded RCA, no intervention due to non dominant nature 11/22 - TTE >>> EF 45-50%, abnormal relaxation, mild aortic regurgitation 11/29 - Remains on vent, pending trach on 12/1 per ENT 12/01 - Tracheostomy by ENT completed 12/03 - Bleeding around trach site noted  LINES / TUBES: Foley 11/21 >>> PEG ??? >>> R fem CVL 11/21 >>> out R fem A-Line 11/21 >>> out R PICC 12/1 >>>  CULTURES: 11/21 Blood >>> Neg 11/22 Sputum >>> WBCs, no organisms  ANTIBIOTICS: Azithromycin 11/21 >>>11/28 Ceftriaxone 11/21>>>11/30  SUBJECTIVE:  Patient awake and alert with wife at bedside.  Bleeding noted from trach last night with some clots being coughed/suctioned.   Heparin has been held, but bleeding continues. Febrile  VITAL SIGNS: Temp:  [98.6 F (37 C)-101.9 F (38.8 C)] 101.9 F (38.8 C) (12/04 0759) Pulse Rate:  [80-88] 82 (12/04 0913) Resp:  [14-23] 14 (12/04 0913) BP: (114-169)/(59-101) 130/67 mmHg (12/04 0959) SpO2:  [96 %-100 %] 100 % (12/04 0756) FiO2 (%):  [30 %] 30 % (12/04 0913) Weight:  [158 lb 11.7 oz (72 kg)] 158 lb 11.7 oz (72 kg) (12/04 0500)  HEMODYNAMICS:   VENTILATOR SETTINGS: Vent Mode:  [-] PRVC FiO2 (%):  [30 %] 30 % Set Rate:  [14 bmp] 14 bmp Vt Set:  [500 mL] 500 mL PEEP:  [5 cmH20] 5 cmH20 Plateau Pressure:  [16 cmH20-18 cmH20] 17 cmH20  INTAKE / OUTPUT: Intake/Output     12/03 0701 - 12/04 0700 12/04 0701 - 12/05 0700    I.V. (mL/kg) 90 (1.3) 40 (0.6)   Blood 225    NG/GT 845 130   Total Intake(mL/kg) 1160 (16.1) 170 (2.4)   Urine (mL/kg/hr) 2200 (1.3)    Total Output 2200     Net -1040 +170         PHYSICAL EXAMINATION: General:  Awake and in NAD on vent resting comfortably Neuro:  Awake, alert, follows commands, appropriate HEENT:  Trach site has fresh dried blood around site and some blood in suction tube, MM moist and pink Cardiovascular:  S1S2, no murmurs Lungs:  Bilateral air entry, scattered rhonchi Abdomen:  Soft, PEG site intact, bowel sounds present Musculoskeletal:  Torticollis, contractures, bilateral foot drop with boots on Skin:  Penile erosion, skin thin and shiny  LABS:  CBC  Recent Labs Lab 04/21/13 0405 04/22/13 0040 04/22/13 0600  WBC 14.8* 15.3* 13.6*  HGB 8.8* 8.3* 7.3*  HCT 26.8* 25.6* 23.0*  PLT 260 234 233   Coag's  Recent Labs Lab 04/20/13 0430 04/21/13 0405 04/22/13 0600  APTT  --   --  33  INR 1.40 1.58* 1.85*   BMET  Recent Labs Lab 04/19/13 0415 04/21/13 1300 04/22/13 0600  NA 146* 145 145  K 3.8 3.5 3.8  CL 105 106 105  CO2 31 32 33*  BUN 28* 24* 27*  CREATININE 0.87 0.83 0.82  GLUCOSE 85 121* 109*   Electrolytes  Recent Labs Lab 04/16/13 0400  04/19/13 0415  04/21/13 1300 04/22/13 0600  CALCIUM 8.6  < > 8.8 8.4 8.5  MG 1.9  --  2.0  --   --   PHOS 3.0  --  3.9  --   --   < > = values in this interval not displayed. Sepsis Markers No results found for this basename: LATICACIDVEN, PROCALCITON, O2SATVEN,  in the last 168 hours ABG  Recent Labs Lab 04/16/13 0421 04/19/13 0435  PHART 7.444 7.459*  PCO2ART 42.4 48.6*  PO2ART 161.0* 117.0*   Liver Enzymes  Recent Labs Lab 04/18/13 0545  AST 49*  ALT 77*  ALKPHOS 85  BILITOT 0.3  ALBUMIN 2.2*   Cardiac Enzymes No results found for this basename: TROPONINI, PROBNP,  in the last 168 hours Glucose  Recent Labs Lab 04/21/13 1233 04/21/13 1633 04/21/13 2028  04/22/13 0034 04/22/13 0443 04/22/13 0753  GLUCAP 122* 126* 128* 116* 126* 118*   CXR:  12/1>>> Edema much improved, stable cardiomegaly, hardware in good position ASSESSMENT / PLAN:  PULMONARY A:  Acute respiratory failure in setting of pneumonia (CAP vs aspiration)  --> Failed wean  --> s/p Tracheostomy 04/19/13 Possibly element of pulmonary edema in setting of VT. Pulmonary nodule RUL. Bleeding from trach site  P:   - SBTs , goal to trach collar - Cont lasix with gentle net negative - Hold anticoags as bleeding at trach site - ENT following - Racemic epi per ent for bleeding - Avoid manipulation of trach - CT chest once acute issues resolved to clarify nodule  CARDIOVASCULAR A:  VT resolved with defibrillation.  STEMI s/p cardiac cath without intervention.  H/o CAD, HTN, AF. 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 Atrial Fibrillation  P:  - Cardiology following. - Amiodarone for rhythm control - Coreg - Hydralazine prn - Cont lasix 40 mg QD - Hold anticoags & plavix currently until trach site bleeding resolves -not clear that he is a candidate for coumadin (was not on this PTA) - ASA, Lipitor, Metoprolol.  RENAL A:  AKI / oliguria - improving   Acquired hypospadius, chronic Appreciate Dr Belva Crome eval; hypospadius does not appear infected. - Urology will decide disposition for catheter. - Will have to wait for suprapubic cath until critical illness resolved.  Mild hypernatremia  P:   - Trend BMP - Cont lasix 40 mg QD - Replace K in setting of increased lasix dosing  GASTROINTESTINAL A:   Dysphagia s/p PEG.  P:   - TF per Nutrition. - Protonix for GI Px.  HEMATOLOGIC A:   Hemodilution.   Therapeutic anticoagulation for ACS - NOT chronically anticoagulated for AF (reportedly).  P:  - Trend CBC. - Hold anticoags until trach bleeding resolves  INFECTIOUS A:   Pneumonia (aspiration vs CAP), PCT 92 Mild Trending Up Leukocytosis - trending  down 12/3  P:   - Monitor fever curve and WBCs, remains stable - No abx at this time  ENDOCRINE A:   Hyperglycemia.  P:   - SSI.  NEUROLOGIC A:   Encephalopathy H/o multiple CVAs with significant residual deficit Contractures  P:   - Fentanyl PRN. - Foot drop boots. - PT re eval  Terri Piedra [facility] Physician Assistant Student 04/22/13, 10:07 AM  Today's summary:  Patient was negative net 1L yesterday with increase in lasix.  Edema much improved on CXR.  Trach site began bleeding last night.  Anticoags have been held at this time until bleeding around trach site resolves.  Will restart anticoags once bleeding  resolved.  Will begin to attempt wean from vent today given improvement CXR.     Plan for vent SNf eventually  Cyril Mourning MD. FCCP. [facility] Pager [phone] If no response call [phone]

## 2013-04-22 NOTE — Evaluation (Signed)
Physical Therapy Evaluation Patient Details Name: [name] [name] MRN: [id number] DOB: [birth year] Today's Date: 04/22/2013 Time: 1208-1228 PT Time Calculation (min): 20 min  PT Assessment / Plan / Recommendation History of Present Illness  72 y/o M with history of multiple CVA (aphasia, PEG) admitted with respiratory failure in setting of pneumonia and STEMI.   Clinical Impression  Pt with very limited mobility and function at baseline who is cared for 24hr/day by spouse. Pt normally can assist with rolling and sitting on the EOB but with very limited activity tolerance at this time stating fatigue from trach bleeding. Wife would like to take pt home ideally on trach collar but did not seem receptive to the idea of SNF. Will follow for trial acutely to attempt to maximize mobility returning pt to PLOF and increasing strength of LLE.     PT Assessment  Patient needs continued PT services    Follow Up Recommendations  No PT follow up;Supervision/Assistance - 24 hour    Does the patient have the potential to tolerate intense rehabilitation      Barriers to Discharge Other (comment) vent    Equipment Recommendations  None recommended by PT    Recommendations for Other Services     Frequency Min 2X/week    Precautions / Restrictions Precautions Precautions: Fall Precaution Comments: right hemiplegia, torticollis, peg, trach, vent   Pertinent Vitals/Pain No pain VSS PRVC 30%      Mobility  Bed Mobility Bed Mobility: Rolling Left Rolling Left: With rail;1: +1 Total assist Details for Bed Mobility Assistance: hand over hand assist for pt to be able to reach with LUE for rail and assist to LLE into flexion, pt then able to grasp rail and total assit to roll pt to sidelying Transfers Transfers: Not assessed    Exercises     PT Diagnosis: Generalized weakness  PT Problem List: Decreased mobility;Decreased strength PT Treatment Interventions: Functional mobility  training;Therapeutic activities;Therapeutic exercise;Patient/family education     PT Goals(Current goals can be found in the care plan section) Acute Rehab PT Goals Patient Stated Goal: spouse stated she wants to be able to take pt home and for his LLE to work better PT Goal Formulation: With family Time For Goal Achievement: 05/06/13 Potential to Achieve Goals: Fair  Visit Information  Last PT Received On: 04/22/13 Assistance Needed: +2 (2 for OOB, 1 for HEP) History of Present Illness: 72 y/o M with history of multiple CVA (aphasia, PEG) admitted with respiratory failure in setting of pneumonia and STEMI.        Prior Functioning  Home Living Family/patient expects to be discharged to:: Private residence Living Arrangements: Spouse/significant other Available Help at Discharge: Family;Available 24 hours/day Type of Home: House Home Equipment: Wheelchair - manual;Wheelchair - power;Hospital bed;Other (comment) (hoyer lift) Additional Comments: pt has Conservation officer, nature and high back chair Prior Function Level of Independence: Needs assistance Gait / Transfers Assistance Needed: pt total assist for all mobility, assisted with rolling and sitting EOB with leaning per wife. Total assist for transfers either physical assist or hoyer to W/C grossly 1x/wk ADL's / Homemaking Assistance Needed: wife  does all bathing/dressing and household chores as well as feeds pt Communication Communication: Tracheostomy Dominant Hand: Left    Cognition  Cognition Arousal/Alertness: Awake/alert Behavior During Therapy: Flat affect Overall Cognitive Status: History of cognitive impairments - at baseline    Extremity/Trunk Assessment Upper Extremity Assessment Upper Extremity Assessment: Generalized weakness;RUE deficits/detail RUE Deficits / Details: pt with flaccid RUE with hyperextension of  all PIP of the hand with hard end feel to elbow flexion at grossly 90 degrees but full extension Lower Extremity  Assessment Lower Extremity Assessment: RLE deficits/detail;LLE deficits/detail RLE Deficits / Details: flaccid RLE LLE Deficits / Details: knee flexion 2-/5, able to wiggle toes did not demonstrate other AROM at this time LLE Coordination: decreased gross motor Cervical / Trunk Assessment Cervical / Trunk Assessment: Other exceptions Cervical / Trunk Exceptions: severe torticollis with head to left   Balance    End of Session PT - End of Session Activity Tolerance: Patient limited by fatigue Patient left: in bed;with call bell/phone within reach;with family/visitor present Nurse Communication: Mobility status;Need for lift equipment  GP     [name] [name] 04/22/2013, 2:40 PM [name] [name], PT [phone]

## 2013-04-22 NOTE — Progress Notes (Signed)
Respiratory aware of MD request to wean ventilator setting for pt

## 2013-04-22 NOTE — Progress Notes (Signed)
RT note:  Patient had profuse bleeding around trach site throughout the night.  Patient also coughed up a large blood clot. Bleeding filled ETT ballard and HME on two occassions.

## 2013-04-22 NOTE — Progress Notes (Signed)
   ENT Progress Note: POD #3 s/p Procedure(s): TRACHEOSTOMY   Subjective: Pt stable on vent  Objective: Vital signs in last 24 hours: Temp:  [98.6 F (37 C)-101.9 F (38.8 C)] 101.9 F (38.8 C) (12/04 0759) Pulse Rate:  [80-88] 86 (12/04 0822) Resp:  [14-27] 16 (12/04 0800) BP: (114-169)/(59-101) 169/94 mmHg (12/04 0822) SpO2:  [96 %-100 %] 100 % (12/04 0756) FiO2 (%):  [30 %] 30 % (12/04 0756) Weight:  [72 kg (158 lb 11.7 oz)] 72 kg (158 lb 11.7 oz) (12/04 0500) Weight change: 0.8 kg (1 lb 12.2 oz) Last BM Date: 04/21/13  Intake/Output from previous day: 12/03 0701 - 12/04 0700 In: 1160 [I.V.:90; Blood:225; NG/GT:845] Out: 2200 [Urine:2200] Intake/Output this shift: Total I/O In: 65 [NG/GT:65] Out: -   Labs:  Recent Labs  04/22/13 0040 04/22/13 0600  WBC 15.3* 13.6*  HGB 8.3* 7.3*  HCT 25.6* 23.0*  PLT 234 233    Recent Labs  04/21/13 1300 04/22/13 0600  NA 145 145  K 3.5 3.8  CL 106 105  CO2 32 33*  GLUCOSE 121* 109*  BUN 24* 27*  CALCIUM 8.4 8.5    Studies/Results: Dg Chest Port 1 View  04/22/2013   CLINICAL DATA:  Bleeding around trach site.  EXAM: PORTABLE CHEST - 1 VIEW  COMPARISON:  04/21/2013 at 1228 hr.  FINDINGS: 2355 hr. Right-sided PICC line that terminates at the low SVC or high right atrium. Numerous leads and wires project over the chest. Tracheostomy remains appropriately positioned. Mild cardiomegaly. No pleural effusion or pneumothorax. Mild interstitial edema which is asymmetric, and greater on the left than right. Improved left base aeration with mild airspace disease remaining. Right upper lobe nodular density is partially obscured by overlying wire.  IMPRESSION: Appropriate position of tracheostomy, without explanation for bleeding.  Persistent asymmetric interstitial edema with improved left base airspace disease.  Right upper lobe nodular density, slightly less distinct than on the exam of earlier today. Recommend attention on  followup.   Electronically Signed   By: Jeronimo Greaves M.D.   On: 04/22/2013 00:47   Dg Chest Port 1 View  04/21/2013   CLINICAL DATA:  Difficulty weaning the patient, history of pulmonary edema  EXAM: PORTABLE CHEST - 1 VIEW  COMPARISON:  Chest x-ray dated April 19, 2013. And November 30th 2014  FINDINGS: The patient is rotated on this study. The lungs are reasonably well inflated. There remain increased interstitial markings especially on the left. . A nodular mass persists in the right upper hemi thorax. No pleural effusion is demonstrated. The cardiopericardial silhouette is enlarged. The sub [place] venous catheter tip lies in the region of the junction of the SVC with the right atrium. The endotracheal collection the tracheostomy appliance tip lies approximately 4 cm above the crotch of the carina.  IMPRESSION: 1. The findings suggest persistent mild interstitial edema secondary to CHF. 2. A pulmonary nodule or mass is present in the right upper lobe measuring approximately 2.3 cm in greatest dimension.   Electronically Signed   By: Izeah Vossler  [place]   On: 04/21/2013 13:28     PHYSICAL EXAM: Trach in place No further bleeding   Assessment/Plan: Acute bleeding post-trach, stable Currently on anti-coag tx Monitor at this time, contact us if more acute bleeding. Would not rec OR eval at this time.     [name] [name] 04/22/2013, 8:48 AM

## 2013-04-22 NOTE — Consult Note (Signed)
[name] [name], Custard [id number] 10-14-1940 Peter M [place], MD  Reason for Consult: bleeding from trach  HPI: s/p tracheotomy by Dr. Annalee Genta on 04/19/13. Patient is on Coumadin and aspirin and has had some bleeding around and through trach tube and some hemotypsis for which ENT was consulted.  Allergies: No Known Allergies  ROS: respiratory failure/hemoptysis, otherwise negative x 10 systems except per HPI.  PMH:  Past Medical History  Diagnosis Date  . CVA (cerebral infarction)     a. 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 b. Residual aphasia and torticollis. Does not ambulate - uses a motorized chair.  Marland Kitchen TIA (transient ischemic attack)     a. 2013 around time of pneumonia - x3  . Pneumonia   . History of jejunostomy tube placement     a. Per wife due to strokes.  . Irregular heartbeat     a. Per wife. No hx of blood thinners or complications from blood thinners.  . Enlarged heart     a. Per wife.  Marland Kitchen HTN (hypertension)   . Pulmonary mass     a. Wife states has been followed with serial cxrs.    FH:  Family History  Problem Relation Age of Onset  . Stroke Father     SH:  History   Social History  . Marital Status: Married    Spouse Name: N/A    Number of Children: N/A  . Years of Education: N/A   Occupational History  . Not on file.   Social History Main Topics  . Smoking status: Never Smoker   . Smokeless tobacco: Not on file  . Alcohol Use: No  . Drug Use: No  . Sexual Activity: Not on file   Other Topics Concern  . Not on file   Social History Narrative   Lives with wife who has been primary care giver. Married 50 yrs as of 08/2012. They have a son but do not see him regularly.    PSH:  Past Surgical History  Procedure Laterality Date  . Tracheostomy tube placement N/A 04/19/2013    Procedure: TRACHEOSTOMY;  Surgeon: Osborn Coho, MD;  Location: [facility] OR;  Service: ENT;  Laterality: N/A;    Physical  Exam: leftward torticollis, nonverbal, obvious sequalae  of stroke.  EAC/TMs normal BL. Oral cavity, lips, gums, ororpharynx normal with no masses or lesions. Skin warm and dry. Nasal cavity without polyps or purulence, no epistaxis. External nose and ears without masses or lesions. Tracheostomy tube in place. I see no bleeding from around the trach tube which is sutured in place.  Procedure Note: 31575 flexible tracheoscopy. Informed verbal consent was obtained after explaining the risks (including bleeding and infection), benefits and alternatives of the procedure. Verbal timeout was performed prior to the procedure. The 4mm flexible scope was advanced through the tracheostomy tube. The tracheal lumen and mucosa are intact with no evidence of tracheoinnominate fistula or tracheitis or tracheal lacerations. The carina is patent but there is some nonocclusive blood clot coming up from the mainstem bronchi L>R and a streak of blood clot posteriorly coming down from the surgical site. The patient tolerated the procedure with no immediate complications.  A/P: 3 days s/p tracheotomy with bleeding likely from trach site and some likely old blood aspirated during the tracheotomy being coughed up from the lungs. The bleeding appears to be fairly normal post-op oozing from the surgery given the aspirin and coumadin that the patient is on. If his anticoagulation/blood thinners cannot be stopped  would recommend racemic epinephrine nebulizers as needed which will help with the bleeding, and his trach site can be dressed with betadine gauze or sterile gauze around the trach flange which will help tamponade the bleeding. Should also use saline nebs/bullets prior to suctioning which will help loosen the trach secretions/clot.  Can touch base with Dr. Annalee Genta to see if he feels the patient needs to go back to the OR or needs any other surgical interventions.   Melvenia Beam 04/22/2013 8:11 AM

## 2013-04-23 DIAGNOSIS — J9509 Other tracheostomy complication: Secondary | ICD-10-CM

## 2013-04-23 LAB — CBC
HCT: 24.1 % — ABNORMAL LOW (ref 39.0–52.0)
Hemoglobin: 7.7 g/dL — ABNORMAL LOW (ref 13.0–17.0)
MCH: 31.4 pg (ref 26.0–34.0)
MCV: 98.4 fL (ref 78.0–100.0)
Platelets: 259 10*3/uL (ref 150–400)
RDW: 16.2 % — ABNORMAL HIGH (ref 11.5–15.5)
WBC: 12.8 10*3/uL — ABNORMAL HIGH (ref 4.0–10.5)

## 2013-04-23 LAB — PREPARE FRESH FROZEN PLASMA
Unit division: 0
Unit division: 0

## 2013-04-23 LAB — GLUCOSE, CAPILLARY
Glucose-Capillary: 123 mg/dL — ABNORMAL HIGH (ref 70–99)
Glucose-Capillary: 130 mg/dL — ABNORMAL HIGH (ref 70–99)

## 2013-04-23 LAB — BASIC METABOLIC PANEL
BUN: 29 mg/dL — ABNORMAL HIGH (ref 6–23)
CO2: 33 mEq/L — ABNORMAL HIGH (ref 19–32)
Calcium: 8.7 mg/dL (ref 8.4–10.5)
GFR calc non Af Amer: 84 mL/min — ABNORMAL LOW (ref >90)
Glucose, Bld: 143 mg/dL — ABNORMAL HIGH (ref 70–99)
Potassium: 4.2 mEq/L (ref 3.5–5.1)
Sodium: 147 mEq/L — ABNORMAL HIGH (ref 135–145)

## 2013-04-23 MED ORDER — POTASSIUM CHLORIDE 20 MEQ/15ML (10%) PO LIQD
20.0000 meq | Freq: Every day | ORAL | Status: DC
  Administered 2013-04-24 – 2013-04-30 (×7): 20 meq via ORAL
  Filled 2013-04-23 (×7): qty 15

## 2013-04-23 MED ORDER — FREE WATER
200.0000 mL | Freq: Three times a day (TID) | Status: DC
  Administered 2013-04-23 – 2013-04-30 (×22): 200 mL

## 2013-04-23 NOTE — Progress Notes (Signed)
error 

## 2013-04-23 NOTE — Progress Notes (Signed)
     Subjective:   Awake. Follows command. S/P tracheostomy 04/19/13 has had bleeding around trach. Coumadin stopped. Bleeding stopped.   Objective:  Vital Signs in the last 24 hours: Temp:  [98.3 F (36.8 C)-100.1 F (37.8 C)] 99.5 F (37.5 C) (12/05 1200) Pulse Rate:  [69-83] 79 (12/05 1200) Resp:  [15-34] 34 (12/05 1200) BP: (131-177)/(65-92) 134/65 mmHg (12/05 1200) SpO2:  [98 %-100 %] 100 % (12/05 1200) FiO2 (%):  [28 %-30 %] 28 % (12/05 1200) Weight:  [71.9 kg (158 lb 8.2 oz)] 71.9 kg (158 lb 8.2 oz) (12/05 0423)  Intake/Output from previous day: 12/04 0701 - 12/05 0700 In: 1785 [I.V.:420; NG/GT:1365] Out: 2150 [Urine:2150]  Physical Exam: Pt is alert, NAD Chronically ill appearing HEENT: normal Neck: + Trach  colalr Lungs: coarse bilaterally CV: RRR without murmur or gallop Abd: soft, NT, Positive BS, no hepatomegaly Ext: tr edema. +contractures Skin: warm/dry no rash  Lab Results:  Recent Labs  04/22/13 0600 04/23/13 0430  WBC 13.6* 12.8*  HGB 7.3* 7.7*  PLT 233 259    Recent Labs  04/22/13 0600 04/23/13 0430  NA 145 147*  K 3.8 4.2  CL 105 108  CO2 33* 33*  GLUCOSE 109* 143*  BUN 27* 29*  CREATININE 0.82 0.89   No results found for this basename: TROPONINI, CK, MB,  in the last 72 hours  Tele: Sinus rhythm, PVC's  Assessment/Plan:  1. Atrial fibrillation -  CHADS = 4. Coumadin stopped due to bleeding around trach. Maintaining sinus rhythm on amiodarone. Discussed anticoagulation with wife at bedside. He has never been on coumadin before. Given high CHADS score would ideally favor anti-coagulation but did not tolerate here in hospital due to bleeding. We briefly discussed NOACs with lower risk of bleeding but they cannot afford. Thus I think ASA 325/plavix 75 is an option if they can afford. (otherwise ASA 325 alone)  2. Acute on chronic respiratory failure. Now s/p trach. PCCM following.  3. CHF - acute on chronic diastolic. Continue lasix  40 mg daily. Volume status looks good.   4. H/o CVA's  5. CAD s/p Inferior STEMI on admit. Cath with 95% lesion in nondominant RCA. Left system OK. Treated medically with b-blocker, asa, statin.   Arvilla Meres, MD 04/23/2013, 1:26 PM

## 2013-04-23 NOTE — Progress Notes (Signed)
PULMONARY  / CRITICAL CARE MEDICINE  Name: [name] [name] MRN: [id number] DOB: 07-Jan-1941    ADMISSION DATE:  04/09/2013 CONSULTATION DATE:  04/09/2013  REFERRING MD :  Cardiology PRIMARY SERVICE: PCCM  CHIEF COMPLAINT:  Acute respiratory failure  BRIEF PATIENT DESCRIPTION: 72 y/o M with history of multiple CVA (aphasia, PEG) admitted with respiratory failure in setting of pneumonia and STEMI.  SIGNIFICANT EVENTS / STUDIES:  11/21 - Admitted with respiratory failure in setting of pneumonia and STEMI 11/21 - Cardiac cath >>> 95% occluded RCA, no intervention due to non dominant nature 11/22 - TTE >>> EF 45-50%, abnormal relaxation, mild aortic regurgitation 11/29 - Remains on vent, pending trach on 12/1 per ENT 12/01 - Tracheostomy by ENT completed 12/03 - Bleeding around trach site noted  LINES / TUBES: Foley 11/21 >>> PEG ??? >>> R fem CVL 11/21 >>> out R fem A-Line 11/21 >>> out R PICC 12/1 >>>  CULTURES: 11/21 Blood >>> Neg 11/22 Sputum >>> WBCs, no organisms  ANTIBIOTICS: Azithromycin 11/21 >>>11/28 Ceftriaxone 11/21>>>11/30  SUBJECTIVE:   Patient awake and alert with wife at bedside.  Bleeding has stopped around trach site On trach collar and tolerating well  VITAL SIGNS: Temp:  [98.3 F (36.8 C)-100.1 F (37.8 C)] 100.1 F (37.8 C) (12/05 0730) Pulse Rate:  [69-83] 83 (12/05 0900) Resp:  [14-34] 34 (12/05 0900) BP: (131-177)/(67-92) 156/84 mmHg (12/05 0952) SpO2:  [98 %-100 %] 100 % (12/05 0900) FiO2 (%):  [28 %-30 %] 28 % (12/05 0858) Weight:  [158 lb 8.2 oz (71.9 kg)] 158 lb 8.2 oz (71.9 kg) (12/05 0423)  HEMODYNAMICS:   VENTILATOR SETTINGS: Vent Mode:  [-] PRVC FiO2 (%):  [28 %-30 %] 28 % Set Rate:  [14 bmp] 14 bmp Vt Set:  [500 mL] 500 mL PEEP:  [5 cmH20] 5 cmH20 Plateau Pressure:  [15 cmH20-17 cmH20] 15 cmH20  INTAKE / OUTPUT: Intake/Output     12/04 0701 - 12/05 0700 12/05 0701 - 12/06 0700   I.V. (mL/kg) 420 (5.8)    Blood     NG/GT  1365    Total Intake(mL/kg) 1785 (24.8)    Urine (mL/kg/hr) 2150 (1.2)    Total Output 2150     Net -365           PHYSICAL EXAMINATION: General:  Awake and in NAD on trach collar Neuro:  Awake, alert, follows commands, appropriate HEENT:  Trach site clean, MM moist and pink, mild JVD Cardiovascular:  S1S2, no murmurs Lungs:  Bilateral air entry, CTA, on trach collar Abdomen:  Soft, PEG site intact, bowel sounds present Musculoskeletal:  Torticollis, contractures, bilateral foot drop with boots on Skin:  Penile erosion, skin thin and shiny  LABS:  CBC  Recent Labs Lab 04/22/13 0040 04/22/13 0600 04/23/13 0430  WBC 15.3* 13.6* 12.8*  HGB 8.3* 7.3* 7.7*  HCT 25.6* 23.0* 24.1*  PLT 234 233 259   Coag's  Recent Labs Lab 04/21/13 0405 04/22/13 0600 04/23/13 0430  APTT  --  33  --   INR 1.58* 1.85* 1.91*   BMET  Recent Labs Lab 04/21/13 1300 04/22/13 0600 04/23/13 0430  NA 145 145 147*  K 3.5 3.8 4.2  CL 106 105 108  CO2 32 33* 33*  BUN 24* 27* 29*  CREATININE 0.83 0.82 0.89  GLUCOSE 121* 109* 143*   Electrolytes  Recent Labs Lab 04/19/13 0415 04/21/13 1300 04/22/13 0600 04/23/13 0430  CALCIUM 8.8 8.4 8.5 8.7  MG 2.0  --   --   --  PHOS 3.9  --   --   --    Sepsis Markers No results found for this basename: LATICACIDVEN, PROCALCITON, O2SATVEN,  in the last 168 hours ABG  Recent Labs Lab 04/19/13 0435  PHART 7.459*  PCO2ART 48.6*  PO2ART 117.0*   Liver Enzymes  Recent Labs Lab 04/18/13 0545  AST 49*  ALT 77*  ALKPHOS 85  BILITOT 0.3  ALBUMIN 2.2*   Cardiac Enzymes No results found for this basename: TROPONINI, PROBNP,  in the last 168 hours Glucose  Recent Labs Lab 04/22/13 0753 04/22/13 1227 04/22/13 1553 04/22/13 1935 04/23/13 0004 04/23/13 0340  GLUCAP 118* 115* 123* 121* 116* 123*   CXR:  12/4>>> Edema much improved, stable cardiomegaly, hardware in good position, NNF  ASSESSMENT / PLAN:  PULMONARY A:  Acute  respiratory failure in setting of pneumonia (CAP vs aspiration)  --> Failed wean  --> s/p Tracheostomy 04/19/13 Possibly element of pulmonary edema in setting of VT. Pulmonary nodule RUL. Bleeding from trach site - resolved 12/5  P:   - Trach collar as tolerated - goal 24h  - Cont lasix with gentle net negative - ENT following - d/c racemic epi - Avoid manipulation of trach - CT chest once acute issues resolved to clarify nodule  CARDIOVASCULAR A:  VT resolved with defibrillation.  STEMI s/p cardiac cath without intervention.  H/o CAD, HTN, AF. 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 Atrial Fibrillation  P:  - Cardiology following. - Amiodarone for rhythm control - Coreg - Hydralazine prn - Cont lasix 40 mg QD - Restart Plavix in 24- 48h once bleeding resolved - Hold anticoags-not clear that he is a candidate for coumadin (was not on this PTA) - ASA, Lipitor, Metoprolol.  RENAL A:  AKI / oliguria - improving   Acquired hypospadius, chronic Appreciate Dr Belva Crome eval; hypospadius does not appear infected. - Urology will decide disposition for catheter. - Will have to wait for suprapubic cath until critical illness resolved.  Mild hypernatremia  P:   - Trend BMP - Cont lasix 40 mg QD - upward trend in Na, give 200 mL free water q8  GASTROINTESTINAL A:   Dysphagia s/p PEG.  P:   - TF per Nutrition. - Protonix for GI Px.  HEMATOLOGIC A:   Hemodilution.   Therapeutic anticoagulation for ACS - NOT chronically anticoagulated for AF (reportedly).  P:  - Trend CBC.  INFECTIOUS A:   Pneumonia (aspiration vs CAP), PCT 92 Mild Trending Up Leukocytosis - trending down 12/3  P:   - Monitor fever curve and WBCs, remains stable - No abx at this time  ENDOCRINE A:   Hyperglycemia.  P:   - SSI.  NEUROLOGIC A:   Encephalopathy H/o multiple CVAs with significant residual deficit Contractures  P:   - Fentanyl PRN. - Foot drop boots. - PT re  eval  Terri Piedra [facility] Physician Assistant Student 04/23/13, 10:15 AM  Today's summary:  Patient tolerating trach collar well today.  Bleeding around trach site has resolved.  Will discuss with cards -not a candidate for coumadin in my opinion.  Restart plavix in 24- 48h once bleeding resolved.  Sodium beginning to trend upward.  Will give free water now.  Plan for vent SNf vs home eventually based on  Progress with ATC.  Cyril Mourning MD. Tonny Bollman. [place] [facility] Pager [phone] If no response call [phone]

## 2013-04-23 NOTE — Progress Notes (Signed)
CSW read PT note stating wife planning to take pt home--per RNCM, respiratory is attempting to wean pt to trach today. CSW continues to follow case.   Maryclare Labrador, MSW, [facility] Clinical Social Worker [phone]

## 2013-04-23 NOTE — Progress Notes (Signed)
resp therapy in to wean pt from the ventilator.

## 2013-04-24 LAB — CBC
HCT: 23.8 % — ABNORMAL LOW (ref 39.0–52.0)
Hemoglobin: 7.5 g/dL — ABNORMAL LOW (ref 13.0–17.0)
MCH: 31.1 pg (ref 26.0–34.0)
MCHC: 31.5 g/dL (ref 30.0–36.0)
MCV: 98.8 fL (ref 78.0–100.0)
Platelets: 267 10*3/uL (ref 150–400)
RBC: 2.41 MIL/uL — ABNORMAL LOW (ref 4.22–5.81)
WBC: 11.4 10*3/uL — ABNORMAL HIGH (ref 4.0–10.5)

## 2013-04-24 LAB — BASIC METABOLIC PANEL
BUN: 32 mg/dL — ABNORMAL HIGH (ref 6–23)
Calcium: 8.6 mg/dL (ref 8.4–10.5)
Chloride: 104 mEq/L (ref 96–112)
Glucose, Bld: 131 mg/dL — ABNORMAL HIGH (ref 70–99)
Potassium: 4.3 mEq/L (ref 3.5–5.1)
Sodium: 143 mEq/L (ref 135–145)

## 2013-04-24 LAB — GLUCOSE, CAPILLARY
Glucose-Capillary: 127 mg/dL — ABNORMAL HIGH (ref 70–99)
Glucose-Capillary: 127 mg/dL — ABNORMAL HIGH (ref 70–99)
Glucose-Capillary: 107 mg/dL — ABNORMAL HIGH (ref 70–99)
Glucose-Capillary: 135 mg/dL — ABNORMAL HIGH (ref 70–99)

## 2013-04-24 NOTE — Progress Notes (Signed)
PULMONARY  / CRITICAL CARE MEDICINE  Name: [name] [name] MRN: [id number] DOB: [birth year]    ADMISSION DATE:  04/09/2013 CONSULTATION DATE:  04/09/2013  REFERRING MD :  Cardiology PRIMARY SERVICE: PCCM  CHIEF COMPLAINT:  Acute respiratory failure  BRIEF PATIENT DESCRIPTION: 72 y/o M with history of multiple CVA (aphasia, PEG) admitted with respiratory failure in setting of pneumonia and STEMI.  SIGNIFICANT EVENTS / STUDIES:  11/21 - Admitted with respiratory failure in setting of pneumonia and STEMI 11/21 - Cardiac cath >>> 95% occluded RCA, no intervention due to non dominant nature 11/22 - TTE >>> EF 45-50%, abnormal relaxation, mild aortic regurgitation 11/29 - Remains on vent, pending trach on 12/1 per ENT 12/01 - Tracheostomy by ENT completed 12/03 - Bleeding around trach site noted  LINES / TUBES: Foley 11/21 >>> PEG ??? >>> R fem CVL 11/21 >>> out R fem A-Line 11/21 >>> out R PICC 12/1 >>>  CULTURES: 11/21 Blood >>> Neg 11/22 Sputum >>> WBCs, no organisms  ANTIBIOTICS: Azithromycin 11/21 >>>11/28 Ceftriaxone 11/21>>>11/30  SUBJECTIVE:   Pt sleepy this am but arousable.  On trach collar with increased RR but no increased wob.    VITAL SIGNS: Temp:  [98.1 F (36.7 C)-99.5 F (37.5 C)] 99.1 F (37.3 C) (12/06 0840) Pulse Rate:  [68-89] 73 (12/06 0840) Resp:  [14-46] 38 (12/06 0840) BP: (117-170)/(65-112) 170/83 mmHg (12/06 0840) SpO2:  [99 %-100 %] 100 % (12/06 0840) FiO2 (%):  [28 %-30 %] 28 % (12/06 0840) Weight:  [72 kg (158 lb 11.7 oz)] 72 kg (158 lb 11.7 oz) (12/06 0500)  HEMODYNAMICS:   VENTILATOR SETTINGS: Vent Mode:  [-] PRVC FiO2 (%):  [28 %-30 %] 28 % Set Rate:  [14 bmp] 14 bmp Vt Set:  [500 mL] 500 mL PEEP:  [5 cmH20] 5 cmH20 Plateau Pressure:  [14 cmH20-15 cmH20] 14 cmH20  INTAKE / OUTPUT: Intake/Output     12/05 0701 - 12/06 0700 12/06 0701 - 12/07 0700   I.V. (mL/kg)     Other 130    NG/GT 845    Total Intake(mL/kg) 975 (13.5)    Urine (mL/kg/hr) 2150 (1.2)    Total Output 2150     Net -1175           PHYSICAL EXAMINATION: General:  Wd male nad on tc Neuro: arousable, FC HEENT:  Trach site clean,no nasal discharge Cardiovascular:  Mild tachy, no murmurs Lungs:  Decreased depth inspiration, a few scattered crackles. Abdomen:  Soft, PEG site intact, bowel sounds present Musculoskeletal:  Torticollis, contractures, bilateral foot drop with boots on   LABS:  CBC  Recent Labs Lab 04/22/13 0600 04/23/13 0430 04/24/13 0500  WBC 13.6* 12.8* 11.4*  HGB 7.3* 7.7* 7.5*  HCT 23.0* 24.1* 23.8*  PLT 233 259 267   Coag's  Recent Labs Lab 04/22/13 0600 04/23/13 0430 04/24/13 0500  APTT 33  --   --   INR 1.85* 1.91* 1.85*   BMET  Recent Labs Lab 04/22/13 0600 04/23/13 0430 04/24/13 0500  NA 145 147* 143  K 3.8 4.2 4.3  CL 105 108 104  CO2 33* 33* 32  BUN 27* 29* 32*  CREATININE 0.82 0.89 0.87  GLUCOSE 109* 143* 131*   Electrolytes  Recent Labs Lab 04/19/13 0415  04/22/13 0600 04/23/13 0430 04/24/13 0500  CALCIUM 8.8  < > 8.5 8.7 8.6  MG 2.0  --   --   --   --   PHOS 3.9  --   --   --   --   < > =  values in this interval not displayed. Sepsis Markers No results found for this basename: LATICACIDVEN, PROCALCITON, O2SATVEN,  in the last 168 hours ABG  Recent Labs Lab 04/19/13 0435  PHART 7.459*  PCO2ART 48.6*  PO2ART 117.0*   Liver Enzymes  Recent Labs Lab 04/18/13 0545  AST 49*  ALT 77*  ALKPHOS 85  BILITOT 0.3  ALBUMIN 2.2*   Cardiac Enzymes No results found for this basename: TROPONINI, PROBNP,  in the last 168 hours Glucose  Recent Labs Lab 04/23/13 0732 04/23/13 1338 04/23/13 1710 04/23/13 2020 04/23/13 2351 04/24/13 0459  GLUCAP 124* 141* 130* 115* 127* 127*    ASSESSMENT / PLAN:  PULMONARY A:  Acute respiratory failure in setting of pneumonia (CAP vs aspiration)  --> Failed wean  --> s/p Tracheostomy 04/19/13 Possibly element of pulmonary edema in  setting of VT. Pulmonary nodule RUL. Bleeding from trach site - resolved 12/5  The pt seems to be stable from a pulmonary standpoint, and tolerating TC currently.  Will continue weaning during day as tolerated.   P:   - Trach collar as tolerated - goal 24h  - ENT following - CT chest once acute issues resolved to clarify nodule  CARDIOVASCULAR A:  VT resolved with defibrillation.  STEMI s/p cardiac cath without intervention.  H/o CAD, HTN, AF. 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 Atrial Fibrillation  P:  - Cardiology following.   INFECTIOUS A:   Pneumonia (aspiration vs CAP), PCT 92 Mild Trending Up Leukocytosis - trending down 12/3  P:   - Monitor fever curve and WBCs, remains stable - No abx at this time

## 2013-04-24 NOTE — Progress Notes (Signed)
     Subjective:   Awake. Follows command. S/P tracheostomy 04/19/13 has had bleeding around trach. No bleeding for past 2 days.  His wife in room feels like he is doing well today. No chest pain or dyspnea.  Objective:  Vital Signs in the last 24 hours: Temp:  [98.1 F (36.7 C)-99.5 F (37.5 C)] 99.1 F (37.3 C) (12/06 0840) Pulse Rate:  [68-89] 73 (12/06 0840) Resp:  [14-46] 38 (12/06 0840) BP: (117-170)/(65-112) 170/83 mmHg (12/06 0840) SpO2:  [98 %-100 %] 98 % (12/06 1116) FiO2 (%):  [28 %-30 %] 28 % (12/06 1116) Weight:  [158 lb 11.7 oz (72 kg)] 158 lb 11.7 oz (72 kg) (12/06 0500)  Intake/Output from previous day: 12/05 0701 - 12/06 0700 In: 975 [NG/GT:845] Out: 2150 [Urine:2150]  Physical Exam: Pt is alert, NAD Chronically ill appearing HEENT: normal Neck: + Trach  colalr Lungs: coarse bilaterally CV: RRR without murmur or gallop Abd: soft, NT, Positive BS, no hepatomegaly Ext: tr edema. +contractures Skin: warm/dry no rash  Lab Results:  Recent Labs  04/23/13 0430 04/24/13 0500  WBC 12.8* 11.4*  HGB 7.7* 7.5*  PLT 259 267    Recent Labs  04/23/13 0430 04/24/13 0500  NA 147* 143  K 4.2 4.3  CL 108 104  CO2 33* 32  GLUCOSE 143* 131*  BUN 29* 32*  CREATININE 0.89 0.87   No results found for this basename: TROPONINI, CK, MB,  in the last 72 hours  Tele: Sinus rhythm, PVC's  Assessment/Plan:  1. Atrial fibrillation -  CHADS = 4. Coumadin stopped due to bleeding around trach. Maintaining sinus rhythm on amiodarone. Dr. Gala Romney discussed anticoagulation with wife at bedside. He has never been on coumadin before. Given high CHADS score would ideally favor anti-coagulation but did not tolerate here in hospital due to bleeding. He briefly discussed NOACs with lower risk of bleeding but they cannot afford. Thus  ASA 325/plavix 75 is an option if they can afford. (otherwise ASA 325 alone)  2. Acute on chronic respiratory failure. Now s/p trach. PCCM  following.  3. CHF - acute on chronic diastolic. Continue lasix 40 mg daily. Volume status looks good.   4. H/o CVA's  5. CAD s/p Inferior STEMI on admit. Cath with 95% lesion in nondominant RCA. Left system OK. Treated medically with b-blocker, asa, statin.   Cassell Clement, MD 04/24/2013, 11:36 AM

## 2013-04-24 NOTE — Progress Notes (Signed)
Placed on 28% ATC

## 2013-04-24 NOTE — Progress Notes (Signed)
Patient on 28% ATC

## 2013-04-25 LAB — GLUCOSE, CAPILLARY
Glucose-Capillary: 114 mg/dL — ABNORMAL HIGH (ref 70–99)
Glucose-Capillary: 141 mg/dL — ABNORMAL HIGH (ref 70–99)
Glucose-Capillary: 119 mg/dL — ABNORMAL HIGH (ref 70–99)

## 2013-04-25 LAB — CBC
Hemoglobin: 7.7 g/dL — ABNORMAL LOW (ref 13.0–17.0)
MCH: 30.6 pg (ref 26.0–34.0)
MCHC: 31 g/dL (ref 30.0–36.0)
MCV: 98.4 fL (ref 78.0–100.0)
RBC: 2.52 MIL/uL — ABNORMAL LOW (ref 4.22–5.81)

## 2013-04-25 LAB — PROTIME-INR
INR: 1.54 — ABNORMAL HIGH (ref 0.00–1.49)
Prothrombin Time: 18.1 seconds — ABNORMAL HIGH (ref 11.6–15.2)

## 2013-04-25 MED ORDER — HEPARIN SODIUM (PORCINE) 5000 UNIT/ML IJ SOLN
5000.0000 [IU] | Freq: Three times a day (TID) | INTRAMUSCULAR | Status: DC
  Administered 2013-04-25 – 2013-04-29 (×12): 5000 [IU] via SUBCUTANEOUS
  Filled 2013-04-25 (×17): qty 1

## 2013-04-25 NOTE — Progress Notes (Signed)
Patient Name: [name] [name] Date of Encounter: 04/25/2013     Principal Problem:   ST elevation myocardial infarction (STEMI) of inferolateral wall Active Problems:   CAD (coronary artery disease)   History of CVA (cerebrovascular accident)   History of TIA (transient ischemic attack)   VT (ventricular tachycardia)   Atrial fibrillation with RVR   Pulmonary mass   Acute respiratory failure   Aspiration pneumonia    SUBJECTIVE  Awake. Follows command. S/P tracheostomy 04/19/13 has had no further bleeding around trach. His wife in room feels like he is doing well today. No chest pain or dyspnea.   CURRENT MEDS . amiodarone  400 mg Per Tube Daily  . antiseptic oral rinse  15 mL Mouth Rinse QID  . aspirin  81 mg Per J Tube Daily  . atorvastatin  80 mg Per Tube q1800  . carvedilol  12.5 mg Per Tube BID WC  . chlorhexidine  15 mL Mouth Rinse BID  . free water  200 mL Per Tube Q8H  . furosemide  40 mg Oral Daily  . insulin aspart  0-15 Units Subcutaneous Q4H  . lisinopril  20 mg Per Tube Daily  . oxybutynin  5 mg Per Tube Daily  . pantoprazole sodium  40 mg Per Tube Daily  . potassium chloride  20 mEq Oral Daily    OBJECTIVE  Filed Vitals:   04/25/13 0001 04/25/13 0321 04/25/13 0500 04/25/13 0809  BP: 137/72   159/84  Pulse: 72 75 70 78  Temp: 98.1 F (36.7 C)  98.4 F (36.9 C) 97.7 F (36.5 C)  TempSrc: Axillary  Axillary Axillary  Resp: 37 28 35 28  Height:      Weight:   161 lb 13.1 oz (73.4 kg)   SpO2: 97% 98% 100% 100%    Intake/Output Summary (Last 24 hours) at 04/25/13 1047 Last data filed at 04/25/13 1000  Gross per 24 hour  Intake   1465 ml  Output   1950 ml  Net   -485 ml   Filed Weights   04/23/13 0423 04/24/13 0500 04/25/13 0500  Weight: 158 lb 8.2 oz (71.9 kg) 158 lb 11.7 oz (72 kg) 161 lb 13.1 oz (73.4 kg)    PHYSICAL EXAM  Pt is alert, NAD Chronically ill appearing  HEENT: normal  Neck: + Trach collar. Lungs: coarse bilaterally  worse on right today. CV: RRR with occasional PVCs. Soft systolic murmur at base. Abd: soft, NT, Positive BS, no hepatomegaly  Ext: tr edema. +contractures  Skin: warm/dry no rash   Accessory Clinical Findings  CBC  Recent Labs  04/24/13 0500 04/25/13 0500  WBC 11.4* 12.5*  HGB 7.5* 7.7*  HCT 23.8* 24.8*  MCV 98.8 98.4  PLT 267 300   Basic Metabolic Panel  Recent Labs  04/23/13 0430 04/24/13 0500  NA 147* 143  K 4.2 4.3  CL 108 104  CO2 33* 32  GLUCOSE 143* 131*  BUN 29* 32*  CREATININE 0.89 0.87  CALCIUM 8.7 8.6   Liver Function Tests No results found for this basename: AST, ALT, ALKPHOS, BILITOT, PROT, ALBUMIN,  in the last 72 hours No results found for this basename: LIPASE, AMYLASE,  in the last 72 hours Cardiac Enzymes No results found for this basename: CKTOTAL, CKMB, CKMBINDEX, TROPONINI,  in the last 72 hours BNP No components found with this basename: POCBNP,  D-Dimer No results found for this basename: DDIMER,  in the last 72 hours Hemoglobin A1C No results  found for this basename: HGBA1C,  in the last 72 hours Fasting Lipid Panel No results found for this basename: CHOL, HDL, LDLCALC, TRIG, CHOLHDL, LDLDIRECT,  in the last 72 hours Thyroid Function Tests No results found for this basename: TSH, T4TOTAL, FREET3, T3FREE, THYROIDAB,  in the last 72 hours  TELE  NSR with occ PVCs.  ECG    Radiology/Studies  Dg Chest Port 1 View  04/22/2013   CLINICAL DATA:  Bleeding around trach site.  EXAM: PORTABLE CHEST - 1 VIEW  COMPARISON:  04/21/2013 at 1228 hr.  FINDINGS: 2355 hr. Right-sided PICC line that terminates at the low SVC or high right atrium. Numerous leads and wires project over the chest. Tracheostomy remains appropriately positioned. Mild cardiomegaly. No pleural effusion or pneumothorax. Mild interstitial edema which is asymmetric, and greater on the left than right. Improved left base aeration with mild airspace disease remaining. Right  upper lobe nodular density is partially obscured by overlying wire.  IMPRESSION: Appropriate position of tracheostomy, without explanation for bleeding.  Persistent asymmetric interstitial edema with improved left base airspace disease.  Right upper lobe nodular density, slightly less distinct than on the exam of earlier today. Recommend attention on followup.   Electronically Signed   By: Jeronimo Greaves M.D.   On: 04/22/2013 00:47   Dg Chest Port 1 View  04/21/2013   CLINICAL DATA:  Difficulty weaning the patient, history of pulmonary edema  EXAM: PORTABLE CHEST - 1 VIEW  COMPARISON:  Chest x-ray dated April 19, 2013. And November 30th 2014  FINDINGS: The patient is rotated on this study. The lungs are reasonably well inflated. There remain increased interstitial markings especially on the left. . A nodular mass persists in the right upper hemi thorax. No pleural effusion is demonstrated. The cardiopericardial silhouette is enlarged. The sub Clayton venous catheter tip lies in the region of the junction of the SVC with the right atrium. The endotracheal collection the tracheostomy appliance tip lies approximately 4 cm above the crotch of the carina.  IMPRESSION: 1. The findings suggest persistent mild interstitial edema secondary to CHF. 2. A pulmonary nodule or mass is present in the right upper lobe measuring approximately 2.3 cm in greatest dimension.   Electronically Signed   By: David  [place]   On: 04/21/2013 13:28   Dg Chest Port 1 View  04/19/2013   CLINICAL DATA:  Endotracheal tube position.  EXAM: PORTABLE CHEST - 1 VIEW  COMPARISON:  April 18, 2013.  FINDINGS: Dextroscoliosis of upper thoracic spine is noted. Endotracheal tube is in grossly good position with distal tip approximately 5 cm above the carina. No pneumothorax is noted. Minimal left pleural effusion is seen. Stable cardiomegaly. Left retrocardiac opacity is noted and unchanged compared to prior exam. Right lung is clear.  IMPRESSION:  Endotracheal tube in grossly good position. Stable left basilar opacity with associated minimal left pleural effusion.   Electronically Signed   By: Roque Lias M.D.   On: 04/19/2013 08:02   Dg Chest Port 1 View  04/18/2013   CLINICAL DATA:  Intubated  EXAM: PORTABLE CHEST - 1 VIEW  COMPARISON:  the previous day's study  FINDINGS: Endotracheal tube tip 7.4 cm above carina. 2.7 cm nodular opacity in the right upper lung stable. Bibasilar airspace opacities left greater than right as before. There may be a small left pleural effusion. The right lateral costophrenic angle is excluded. Mild cardiomegaly stable.  IMPRESSION: Little significant change from previous day's exam.   Electronically Signed  By: Oley Balm M.D.   On: 04/18/2013 07:34   Dg Chest Port 1 View  04/17/2013   CLINICAL DATA:  Lung nodule  EXAM: PORTABLE CHEST - 1 VIEW  COMPARISON:  Answer previous  FINDINGS: Endotracheal tube stable. 2.7 cm right upper lobe nodule persists. Left lower lung consolidation/ atelectasis as before. Can't exclude small effusion. Stable cardiomegaly.  IMPRESSION: Old change from prior study   Electronically Signed   By: Oley Balm M.D.   On: 04/17/2013 07:24   Dg Chest Port 1 View  04/16/2013   CLINICAL DATA:  Check endotracheal tube position  EXAM: PORTABLE CHEST - 1 VIEW  COMPARISON:  04/14/2013  FINDINGS: An endotracheal tube is again identified in satisfactory position at the level of the aortic knob. Cardiac shadow is stable. Left basilar infiltrative density is again identified and stable. There has been relative clearing in the remainder of the lungs. A 2.9 cm nodular density is identified over the right upper lobe. This corresponds to the anterior aspect of the right 3rd rib anteriorly although remains suspicious for a nodular density. No other focal abnormality is noted.  IMPRESSION: Persistent left basilar infiltrate.  Right upper lobe nodule. CT of the chest is recommended when clinically  able for further evaluation.   Electronically Signed   By: Alcide Clever M.D.   On: 04/16/2013 07:14   Dg Chest Port 1 View  04/14/2013   CLINICAL DATA:  Pneumonia.  EXAM: PORTABLE CHEST - 1 VIEW  COMPARISON:  04/13/2013  FINDINGS: Endotracheal tube is unchanged. Cardiomegaly. Bilateral airspace opacities, left slightly greater than right may reflect asymmetric edema or infection. This is unchanged. More confluent opacity in the left base, atelectasis versus consolidation. Small left pleural effusion, stable.  IMPRESSION: No real change since prior study.   Electronically Signed   By: Charlett Nose M.D.   On: 04/14/2013 07:18   Dg Chest Port 1 View  04/13/2013   CLINICAL DATA:  Intubated patient.  , history of MI and CVA  EXAM: PORTABLE CHEST - 1 VIEW  COMPARISON:  April 12, 2013.  FINDINGS: The patient is not rotated on this study. The endotracheal tube tip lies at the level of the inferior margin of the clavicular heads approximately 4.7 cm from the crotch of the carina. The cardiac silhouette is enlarged. The central pulmonary vascularity is prominent. The interstitial markings of both lungs are mildly increased but are not greatly changed from yesterday's study. The left hemidiaphragm remains obscured. The observed portions of the bony thorax appear normal.  IMPRESSION: 1. The endotracheal tube is in good position approximately 4.7 cm above the carina. 2. The findings are consistent with low grade CHF with mild interstitial edema. 3. Left lower lobe atelectasis is suspected and unchanged.   Electronically Signed   By: David  [place]   On: 04/13/2013 07:52   Dg Chest Port 1 View  04/12/2013   CLINICAL DATA:  Pneumonia  EXAM: PORTABLE CHEST - 1 VIEW  COMPARISON:  04/11/2013; 04/10/2013; 04/09/2013  FINDINGS: Grossly unchanged enlarged cardiac silhouette given patient rotation. Stable position of support apparatus. No pneumothorax. Worsening bilateral perihilar and medial basilar opacities, left greater  than right. Trace left-sided effusion is not excluded. No definite evidence of edema. Grossly unchanged bones.  IMPRESSION: 1.  Stable positioning of support apparatus.  No pneumothorax. 2. Worsening bilateral mid and lower lung heterogeneous opacities, left greater than right, possibly atelectasis and/or asymmetric alveolar pulmonary edema though worrisome for multifocal infection.   Electronically  Signed   By: Simonne Come M.D.   On: 04/12/2013 07:31   Dg Chest Port 1 View  04/11/2013   CLINICAL DATA:  Postprocedural evaluation  EXAM: PORTABLE CHEST - 1 VIEW  COMPARISON:  04/10/2013  FINDINGS: Endotracheal tube is appreciated with tip just below the level clavicles. Dextroscoliotic curvature is identified within the thoracic spine. Cardiac silhouette is enlarged. The areas of multifocal increased density appears stable with technique project into consideration. Residual areas of density projects in the left perihilar and right upper lobe regions.  IMPRESSION: Stable multifocal areas of pulmonary densities with thickening taking into consideration.  Endotracheal tube which appears to be adequately positioned.   Electronically Signed   By: Salome Holmes M.D.   On: 04/11/2013 08:48   Portable Chest Xray In Am  04/10/2013   CLINICAL DATA:  Endotracheal tube position.  EXAM: PORTABLE CHEST - 1 VIEW  COMPARISON:  April 09, 2013.  FINDINGS: Severe dextroscoliosis of upper thoracic spine is noted. Stable cardiomediastinal silhouette. No pleural effusion or pneumothorax is noted. Endotracheal tube is in grossly good position with distal tip approximately 7 cm above the Carina. Nasogastric tube has been removed. Left lung is clear. Improved right upper lobe opacity is noted, although a residual density remains consistent with pneumonia.  IMPRESSION: Endotracheal tube in grossly good position. Improved right lung opacity is noted, although a residual density remains concerning for pneumonia.   Electronically  Signed   By: Roque Lias M.D.   On: 04/10/2013 08:44   Dg Chest Portable 1 View  04/09/2013   CLINICAL DATA:  72 year old male with shortness of Breath status post intubation. Initial encounter.  EXAM: PORTABLE CHEST - 1 VIEW  COMPARISON:  None.  FINDINGS: Portable AP supine view at 0727 hrs. Intubated. Endotracheal tube tip in good position just below the clavicles. Enteric tube courses to the abdomen, tip not included.  Scoliosis. There is cardiomegaly. Other mediastinal contours are within normal limits.  Nodular and confluent opacity throughout the right lung, and also suspected at the left lung base. Left lung base partially obscured by pacer/resuscitation pads. No pneumothorax or definite effusion.  IMPRESSION: 1. Endotracheal tube in good position. Enteric tube courses to the abdomen, tip not included.  2. Multilobar right lung airspace disease. Involvement also suspected at the left lung base. Top considerations are large volume aspiration versus multilobar pneumonia.   Electronically Signed   By: Augusto Gamble M.D.   On: 04/09/2013 07:40    ASSESSMENT AND PLAN  1. Paroxysmal Atrial fibrillation - CHADS = 4. Coumadin stopped due to bleeding around trach. Maintaining sinus rhythm on amiodarone. Dr. Gala Romney discussed anticoagulation with wife at bedside. He has never been on coumadin before. Given high CHADS score would ideally favor anti-coagulation but did not tolerate here in hospital due to bleeding. He briefly discussed NOACs with lower risk of bleeding but they cannot afford. Thus ASA 325/plavix 75 is an option if they can afford. (otherwise ASA 325 alone)  2. Acute on chronic respiratory failure. Now s/p trach. PCCM following.  3. CHF - acute on chronic diastolic. Continue lasix 40 mg daily. Volume status looks good.  4. H/o CVA's  5. CAD s/p Inferior STEMI on admit. Cath with 95% lesion in nondominant RCA. Left system OK. Treated medically with b-blocker, asa, statin.    Signed, Cassell Clement  MD

## 2013-04-25 NOTE — Progress Notes (Signed)
eLink Physician-Brief Progress Note Patient Name: [name] [name] DOB: 03/07/1941 MRN: [id number]  Date of Service  04/25/2013   HPI/Events of Note   INR 1.5 today  eICU Interventions  Restarting DVT proph today.  If bleeding from trach restarts or other bleeding develops please address heparin.        Henry Russel, P 04/25/2013, 3:20 PM

## 2013-04-26 ENCOUNTER — Encounter (HOSPITAL_COMMUNITY): Payer: Self-pay

## 2013-04-26 LAB — CBC
Hemoglobin: 8.1 g/dL — ABNORMAL LOW (ref 13.0–17.0)
MCH: 30.7 pg (ref 26.0–34.0)
MCHC: 31.9 g/dL (ref 30.0–36.0)
MCV: 96.2 fL (ref 78.0–100.0)
Platelets: 315 10*3/uL (ref 150–400)
RBC: 2.64 MIL/uL — ABNORMAL LOW (ref 4.22–5.81)
RDW: 15.6 % — ABNORMAL HIGH (ref 11.5–15.5)

## 2013-04-26 LAB — GLUCOSE, CAPILLARY
Glucose-Capillary: 119 mg/dL — ABNORMAL HIGH (ref 70–99)
Glucose-Capillary: 124 mg/dL — ABNORMAL HIGH (ref 70–99)
Glucose-Capillary: 121 mg/dL — ABNORMAL HIGH (ref 70–99)

## 2013-04-26 LAB — PROTIME-INR: Prothrombin Time: 16.8 seconds — ABNORMAL HIGH (ref 11.6–15.2)

## 2013-04-26 NOTE — Progress Notes (Addendum)
Pt now on trach and has been since 8am on 04/24/13. CSW called pt's wife Claris Che and informed her that now that pt has been on the trach without ventilator support for over 24 hours, CSW will send clinicals to trach SNFs to see which bed offers pt could receive. CSW will update Claris Che when facilities have responded to request.  Addendum: CSW spoke with OT, who reviewed PT note with CSW and explained that because pt is close to his baseline he will likely not get insurance coverage for SNF because he is total assist and his wife has been providing care for him at this level for a significant period of time. CSW called insurance rep with [facility] and left a voicemail requesting rep call CSW back when they have reviewed clinicals, but CSW anticipates that insurance will not cover SNF as the recommendation is for supervision/assistance 24 hour at home. Likely discharge will be home with wife.  Maryclare Labrador, MSW, [facility] Clinical Social Worker [phone]

## 2013-04-26 NOTE — Progress Notes (Signed)
Patient Name: [name] [name] Date of Encounter: 04/26/2013     Principal Problem:   ST elevation myocardial infarction (STEMI) of inferolateral wall Active Problems:   CAD (coronary artery disease)   History of CVA (cerebrovascular accident)   History of TIA (transient ischemic attack)   VT (ventricular tachycardia)   Atrial fibrillation with RVR   Pulmonary mass   Acute respiratory failure   Aspiration pneumonia    SUBJECTIVE  Awake. Follows command. S/P tracheostomy 04/19/13 has had no further bleeding around trach. His wife in room feels like he is doing well today. No chest pain or dyspnea. She reports he coughs up "cold" and some formula through trach.   CURRENT MEDS . amiodarone  400 mg Per Tube Daily  . antiseptic oral rinse  15 mL Mouth Rinse QID  . aspirin  81 mg Per J Tube Daily  . atorvastatin  80 mg Per Tube q1800  . carvedilol  12.5 mg Per Tube BID WC  . chlorhexidine  15 mL Mouth Rinse BID  . free water  200 mL Per Tube Q8H  . furosemide  40 mg Oral Daily  . heparin subcutaneous  5,000 Units Subcutaneous Q8H  . insulin aspart  0-15 Units Subcutaneous Q4H  . lisinopril  20 mg Per Tube Daily  . oxybutynin  5 mg Per Tube Daily  . pantoprazole sodium  40 mg Per Tube Daily  . potassium chloride  20 mEq Oral Daily    OBJECTIVE  Filed Vitals:   04/26/13 0441 04/26/13 0444 04/26/13 0600 04/26/13 0748  BP:  148/69 166/94 162/71  Pulse:  70 75 80  Temp:  98.5 F (36.9 C)  97.5 F (36.4 C)  TempSrc:  Axillary  Axillary  Resp:  35 34 38  Height:      Weight: 160 lb 0.9 oz (72.6 kg)     SpO2:  99% 98% 99%    Intake/Output Summary (Last 24 hours) at 04/26/13 0757 Last data filed at 04/26/13 0749  Gross per 24 hour  Intake    915 ml  Output   1800 ml  Net   -885 ml   Filed Weights   04/24/13 0500 04/25/13 0500 04/26/13 0441  Weight: 158 lb 11.7 oz (72 kg) 161 lb 13.1 oz (73.4 kg) 160 lb 0.9 oz (72.6 kg)    PHYSICAL EXAM  Pt is alert, NAD  Chronically ill appearing  HEENT: normal  Neck: + Trach collar. Lungs: coarse BS bilaterally. CV: RRR with occasional PVCs. Soft systolic murmur at base. Abd: soft, NT, Positive BS, no hepatomegaly  Ext: tr edema. +contractures  Skin: warm/dry no rash   Accessory Clinical Findings  CBC  Recent Labs  04/25/13 0500 04/26/13 0500  WBC 12.5* 10.4  HGB 7.7* 8.1*  HCT 24.8* 25.4*  MCV 98.4 96.2  PLT 300 315   Basic Metabolic Panel  Recent Labs  04/24/13 0500  NA 143  K 4.3  CL 104  CO2 32  GLUCOSE 131*  BUN 32*  CREATININE 0.87  CALCIUM 8.6    TELE  NSR with occ PVCs. 12 beat run NSVT  ECG    Radiology/Studies  Dg Chest Port 1 View  04/22/2013   CLINICAL DATA:  Bleeding around trach site.  EXAM: PORTABLE CHEST - 1 VIEW  COMPARISON:  04/21/2013 at 1228 hr.  FINDINGS: 2355 hr. Right-sided PICC line that terminates at the low SVC or high right atrium. Numerous leads and wires project over the chest. Tracheostomy remains  appropriately positioned. Mild cardiomegaly. No pleural effusion or pneumothorax. Mild interstitial edema which is asymmetric, and greater on the left than right. Improved left base aeration with mild airspace disease remaining. Right upper lobe nodular density is partially obscured by overlying wire.  IMPRESSION: Appropriate position of tracheostomy, without explanation for bleeding.  Persistent asymmetric interstitial edema with improved left base airspace disease.  Right upper lobe nodular density, slightly less distinct than on the exam of earlier today. Recommend attention on followup.   Electronically Signed   By: Jeronimo Greaves M.D.   On: 04/22/2013 00:47   Dg Chest Port 1 View  04/21/2013   CLINICAL DATA:  Difficulty weaning the patient, history of pulmonary edema  EXAM: PORTABLE CHEST - 1 VIEW  COMPARISON:  Chest x-ray dated April 19, 2013. And November 30th 2014  FINDINGS: The patient is rotated on this study. The lungs are reasonably well  inflated. There remain increased interstitial markings especially on the left. . A nodular mass persists in the right upper hemi thorax. No pleural effusion is demonstrated. The cardiopericardial silhouette is enlarged. The sub Clayton venous catheter tip lies in the region of the junction of the SVC with the right atrium. The endotracheal collection the tracheostomy appliance tip lies approximately 4 cm above the crotch of the carina.  IMPRESSION: 1. The findings suggest persistent mild interstitial edema secondary to CHF. 2. A pulmonary nodule or mass is present in the right upper lobe measuring approximately 2.3 cm in greatest dimension.  Dg Chest Port 1 View    ASSESSMENT AND PLAN  1. Paroxysmal Atrial fibrillation-probably related to acute PNA/respiratory failure - CHADS = 4. Coumadin stopped due to bleeding around trach and anemia. Maintaining sinus rhythm on amiodarone. Given high CHADS score would ideally favor anti-coagulation but did not tolerate here in hospital due to bleeding. Since he is maintaining NSR on amio we will just continue ASA for now. 2. Acute on chronic respiratory failure. Now s/p trach. PCCM following. Will need input on disposition. Seems to be tolerating trach collar. 3. CHF - acute on chronic diastolic. Continue lasix 40 mg daily. Volume status looks good.  4. H/o CVA's  5. CAD s/p ? Inferior STEMI on admit. Cath with 95% lesion in nondominant RCA. Left system OK. Treated medically with b-blocker, asa, statin.    Signed, Peter [place]  MD,FACC 04/26/2013 8:03 AM

## 2013-04-26 NOTE — Progress Notes (Signed)
PULMONARY  / CRITICAL CARE MEDICINE  Name: [name] [name] MRN: [id number] DOB: 07/29/1940    ADMISSION DATE:  04/09/2013 CONSULTATION DATE:  04/09/2013  REFERRING MD :  Cardiology PRIMARY SERVICE: PCCM  CHIEF COMPLAINT:  Acute respiratory failure  BRIEF PATIENT DESCRIPTION: 72 y/o M with history of multiple CVA (aphasia, PEG) admitted with respiratory failure in setting of pneumonia and STEMI.  SIGNIFICANT EVENTS / STUDIES:  11/21 - Admitted with respiratory failure in setting of pneumonia and STEMI 11/21 - Cardiac cath >>> 95% occluded RCA, no intervention due to non dominant nature 11/22 - TTE >>> EF 45-50%, abnormal relaxation, mild aortic regurgitation 11/29 - Remains on vent, pending trach on 12/1 per ENT 12/01 - Tracheostomy by ENT completed 12/03 - Bleeding around trach site noted  LINES / TUBES: Foley 11/21 >>> PEG ??? >>> R fem CVL 11/21 >>> out R fem A-Line 11/21 >>> out R PICC 12/1 >>>  CULTURES: 11/21 Blood >>> Neg 11/22 Sputum >>> WBCs, no organisms  ANTIBIOTICS: Azithromycin 11/21 >>>11/28 Ceftriaxone 11/21>>>11/30  SUBJECTIVE:   Pt sleepy this am but arousable.  On trach collar with increased RR but no increased wob.    VITAL SIGNS: Temp:  [97.4 F (36.3 C)-98.5 F (36.9 C)] 97.5 F (36.4 C) (12/08 0748) Pulse Rate:  [64-82] 82 (12/08 0818) Resp:  [28-39] 34 (12/08 0818) BP: (133-166)/(69-97) 162/71 mmHg (12/08 0748) SpO2:  [98 %-100 %] 100 % (12/08 0818) FiO2 (%):  [28 %] 28 % (12/08 0818) Weight:  [72.6 kg (160 lb 0.9 oz)] 72.6 kg (160 lb 0.9 oz) (12/08 0441)  HEMODYNAMICS:   VENTILATOR SETTINGS: Vent Mode:  [-]  FiO2 (%):  [28 %] 28 %  INTAKE / OUTPUT: Intake/Output     12/07 0701 - 12/08 0700 12/08 0701 - 12/09 0700   NG/GT 915    Total Intake(mL/kg) 915 (12.6)    Urine (mL/kg/hr) 1700 (1) 100 (0.3)   Total Output 1700 100   Net -785 -100         PHYSICAL EXAMINATION: General:  Wd male nad on tc Neuro: arousable, FC HEENT:   Trach site clean,no nasal discharge Cardiovascular:  Mild tachy, no murmurs Lungs:  Decreased depth inspiration, a few scattered crackles. Abdomen:  Soft, PEG site intact, bowel sounds present Musculoskeletal:  Torticollis, contractures, bilateral foot drop with boots on   LABS:  CBC  Recent Labs Lab 04/24/13 0500 04/25/13 0500 04/26/13 0500  WBC 11.4* 12.5* 10.4  HGB 7.5* 7.7* 8.1*  HCT 23.8* 24.8* 25.4*  PLT 267 300 315   Coag's  Recent Labs Lab 04/22/13 0600  04/24/13 0500 04/25/13 0500 04/26/13 0500  APTT 33  --   --   --   --   INR 1.85*  < > 1.85* 1.54* 1.40  < > = values in this interval not displayed. BMET  Recent Labs Lab 04/22/13 0600 04/23/13 0430 04/24/13 0500  NA 145 147* 143  K 3.8 4.2 4.3  CL 105 108 104  CO2 33* 33* 32  BUN 27* 29* 32*  CREATININE 0.82 0.89 0.87  GLUCOSE 109* 143* 131*   Electrolytes  Recent Labs Lab 04/22/13 0600 04/23/13 0430 04/24/13 0500  CALCIUM 8.5 8.7 8.6   Sepsis Markers No results found for this basename: LATICACIDVEN, PROCALCITON, O2SATVEN,  in the last 168 hours ABG No results found for this basename: PHART, PCO2ART, PO2ART,  in the last 168 hours Liver Enzymes No results found for this basename: AST, ALT, ALKPHOS, BILITOT, ALBUMIN,  in the last 168 hours Cardiac Enzymes No results found for this basename: TROPONINI, PROBNP,  in the last 168 hours Glucose  Recent Labs Lab 04/25/13 1156 04/25/13 1632 04/25/13 2036 04/25/13 2353 04/26/13 0440 04/26/13 0746  GLUCAP 119* 132* 116* 118* 119* 113*    ASSESSMENT / PLAN:  PULMONARY A:  Acute respiratory failure in setting of pneumonia (CAP vs aspiration)  --> Failed wean  --> s/p Tracheostomy 04/19/13 Possibly element of pulmonary edema in setting of VT. Pulmonary nodule RUL. Bleeding from trach site - resolved 12/5  The pt seems to be stable from a pulmonary standpoint, and tolerating TC currently.  Will continue weaning during day as  tolerated.   P:   - Trach collar as tolerated - goal 24h/7 at this point. - ENT following. - CT chest once acute issues resolved to clarify nodule.  CARDIOVASCULAR A:  VT resolved with defibrillation.  STEMI s/p cardiac cath without intervention.  H/o CAD, HTN, AF. 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 Atrial Fibrillation  P:  - Cardiology following.  INFECTIOUS A:   Pneumonia (aspiration vs CAP), PCT 92 Mild Trending Up Leukocytosis - trending down 12/3  P:   - Monitor fever curve and WBCs, remains stable - No abx at this time  Alyson Reedy, M.D. [facility] Pulmonary/Critical Care Medicine. Pager: [phone]. After hours pager: [phone].

## 2013-04-27 DIAGNOSIS — D649 Anemia, unspecified: Secondary | ICD-10-CM | POA: Diagnosis present

## 2013-04-27 DIAGNOSIS — Z930 Tracheostomy status: Secondary | ICD-10-CM

## 2013-04-27 DIAGNOSIS — J9620 Acute and chronic respiratory failure, unspecified whether with hypoxia or hypercapnia: Secondary | ICD-10-CM | POA: Diagnosis present

## 2013-04-27 LAB — MAGNESIUM: Magnesium: 2 mg/dL (ref 1.5–2.5)

## 2013-04-27 LAB — BASIC METABOLIC PANEL
CO2: 27 mEq/L (ref 19–32)
Calcium: 8.8 mg/dL (ref 8.4–10.5)
GFR calc non Af Amer: 87 mL/min — ABNORMAL LOW (ref >90)
Potassium: 3.6 mEq/L (ref 3.5–5.1)
Sodium: 140 mEq/L (ref 135–145)

## 2013-04-27 LAB — CBC
HCT: 26.9 % — ABNORMAL LOW (ref 39.0–52.0)
MCH: 30.9 pg (ref 26.0–34.0)
RDW: 15.2 % (ref 11.5–15.5)
WBC: 11.2 10*3/uL — ABNORMAL HIGH (ref 4.0–10.5)

## 2013-04-27 LAB — GLUCOSE, CAPILLARY
Glucose-Capillary: 91 mg/dL (ref 70–99)
Glucose-Capillary: 111 mg/dL — ABNORMAL HIGH (ref 70–99)
Glucose-Capillary: 106 mg/dL — ABNORMAL HIGH (ref 70–99)
Glucose-Capillary: 110 mg/dL — ABNORMAL HIGH (ref 70–99)
Glucose-Capillary: 109 mg/dL — ABNORMAL HIGH (ref 70–99)

## 2013-04-27 LAB — PHOSPHORUS: Phosphorus: 3.1 mg/dL (ref 2.3–4.6)

## 2013-04-27 LAB — PROTIME-INR: INR: 1.17 (ref 0.00–1.49)

## 2013-04-27 NOTE — Progress Notes (Signed)
Trach secure. No breakdown noted at trach site. Sutures still in place. Trach WNL

## 2013-04-27 NOTE — Progress Notes (Signed)
Placed on RA and tol at 94-98%

## 2013-04-27 NOTE — Progress Notes (Signed)
Physical Therapy Treatment Patient Details Name: [name] [name] MRN: [id number] DOB: 13-Apr-1941 Today's Date: 04/27/2013 Time: 4098-1191 PT Time Calculation (min): 29 min  PT Assessment / Plan / Recommendation  History of Present Illness 72 y/o M with history of multiple CVA (aphasia, PEG) admitted with respiratory failure in setting of pneumonia and STEMI.    PT Comments   Pt admitted with above. Pt currently with functional limitations due to balance and endurance deficits.  Pt will benefit from skilled PT to increase their independence and safety with mobility to allow discharge to the venue listed below.   Follow Up Recommendations  No PT follow up;Supervision/Assistance - 24 hour                 Equipment Recommendations  None recommended by PT        Frequency Min 2X/week   Progress towards PT Goals Progress towards PT goals: Not progressing toward goals - comment (Pt works with wife but hesitant to work with PT.)  Plan Current plan remains appropriate    Precautions / Restrictions Precautions Precautions: Fall Precaution Comments: right hemiplegia, torticollis, peg, trach, vent Restrictions Weight Bearing Restrictions: No   Pertinent Vitals/Pain VSS, no pain    Mobility  Bed Mobility Bed Mobility: Rolling Right;Right Sidelying to Sit;Sitting - Scoot to [place] of Bed;Sit to Supine;Scooting to [facility] Rolling Right: 1: +1 Total assist Rolling Left: Not tested (comment) Right Sidelying to Sit: 1: +2 Total assist;With rails;HOB elevated Right Sidelying to Sit: Patient Percentage: 40% Sitting - Scoot to Edge of Bed: 2: Max assist Sit to Supine: 1: +2 Total assist;HOB flat Sit to Supine: Patient Percentage: 30% Scooting to HOB: 1: +2 Total assist Scooting to [facility]: Patient Percentage: 0% Details for Bed Mobility Assistance: hand over hand assist for pt to be able to reach with LUE for rail and assist to LLE into flexion, pt then able to grasp rail and total assit to roll pt  to sidelying.  Wife stated she wanted to show PT how she moves pt so she did.  Wife's body mechanics fair with PT trying to inform her of better positioning.  Apparent that pt and wife have been performing transfer a certain way for awhile and pt did assist wife with transfer.   Transfers Transfers: Not assessed Ambulation/Gait Ambulation/Gait Assistance: Not tested (comment) Stairs: No Wheelchair Mobility Wheelchair Mobility: No    Exercises General Exercises - Lower Extremity Ankle Circles/Pumps: Left;AAROM;5 reps;Supine Heel Slides: AAROM;Left;5 reps;Supine   PT Goals (current goals can now be found in the care plan section)    Visit Information  Last PT Received On: 04/27/13 Assistance Needed: +2 (2 for OOB, 1 for HEP) History of Present Illness: 72 y/o M with history of multiple CVA (aphasia, PEG) admitted with respiratory failure in setting of pneumonia and STEMI.     Subjective Data  Subjective: Shakes head yes and no   Cognition  Cognition Arousal/Alertness: Awake/alert Behavior During Therapy: Flat affect Overall Cognitive Status: History of cognitive impairments - at baseline    Balance  Balance Balance Assessed: Yes Static Sitting Balance Static Sitting - Balance Support: Bilateral upper extremity supported;Feet supported Static Sitting - Level of Assistance: 1: +2 Total assist;Patient percentage (comment) (pt = 50%) Static Sitting - Comment/# of Minutes: Pt sat EOB for 4 min holding onto wife entire time.  Pt needed encouragement to do so.  Wife stayed in front of pt entire time he was sitting with pt holding onto wife.  Pt wanting to  lie down and even with encouragement could not incr pts' time sitting EOB.    End of Session PT - End of Session Equipment Utilized During Treatment: Gait belt;Oxygen (trach) Activity Tolerance: Patient limited by fatigue Patient left: in bed;with call bell/phone within reach;with family/visitor present Nurse Communication: Mobility  status;Need for lift equipment        INGOLD,Laina Guerrieri 04/27/2013, 12:45 PM [facility] [phone] (pager)

## 2013-04-27 NOTE — Progress Notes (Signed)
Trach secure. Sutures still in place. No breakdown noted at trach site. 

## 2013-04-27 NOTE — Progress Notes (Addendum)
NUTRITION FOLLOW UP  Intervention:    Continue current EN regimen RD to follow for nutrition care plan  Nutrition Dx:   Inadequate oral intake related to inability to eat as evidenced by NPO status, ongoing  Goal:   EN to meet > 90% of estimated nutrition needs, met  Monitor:   EN regimen & tolerance, respiratory status, weight, labs, I/O's  Assessment:   Pt admitted with NSTEMI. 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 He has some ability to communication per wife, however is not verbal.  Home regimen is: 1 can Osmolite 1.2 + 1 can Jevity 1.2 in the mornings for a total of 2 cans daily. In addition, pt consumes meals and snacks throughout the day. Wife prepares foods soft and bland, and mashes them into a soft bites. Pt can chew and swallow well at baseline.   Patient transferred from [facility] to 2C-Stepdown 12/2.  Patient currently on trach collar; off ventilatory support.  ENT following.  Jevity 1.2 formula infusing via PEG tube at goal rate of 65 ml/hr providing 1872 kcals, 87 gm protein, 1259 ml of free water.  Free water flushes at 200 ml every 8 hours.  Disposition: trach SNF.  Height: Ht Readings from Last 1 Encounters:  04/09/13 5\' 6"  (1.676 m)    Weight Status ---> stable Wt Readings from Last 1 Encounters:  04/27/13 169 lb 5 oz (76.8 kg)    120/8 160 lb 12/07 161 lb 12/06 158 lb 12/05 158 lb 12/04 158 lb 12/03 156 lb 12/02 148 lb 12/01 153 lb 11/30 161 lb 11/29 160 lb  Re-estimated needs:  Kcal: 1900-2100 Protein: 85-95 gm Fluid: per MD  Skin: neck incision   Diet Order: NPO   Intake/Output Summary (Last 24 hours) at 04/27/13 1412 Last data filed at 04/27/13 1337  Gross per 24 hour  Intake   2875 ml  Output   2350 ml  Net    525 ml    Labs:   Recent Labs Lab 04/23/13 0430 04/24/13 0500 04/27/13 0515  NA 147* 143 140  K 4.2 4.3 3.6  CL 108 104 101  CO2 33* 32 27  BUN 29* 32* 28*  CREATININE 0.89  0.87 0.82  CALCIUM 8.7 8.6 8.8  MG  --   --  2.0  PHOS  --   --  3.1  GLUCOSE 143* 131* 116*    CBG (last 3)   Recent Labs  04/27/13 0456 04/27/13 0817 04/27/13 1331  GLUCAP 116* 111* 106*    Scheduled Meds: . amiodarone  400 mg Per Tube Daily  . antiseptic oral rinse  15 mL Mouth Rinse QID  . aspirin  81 mg Per J Tube Daily  . atorvastatin  80 mg Per Tube q1800  . carvedilol  12.5 mg Per Tube BID WC  . chlorhexidine  15 mL Mouth Rinse BID  . free water  200 mL Per Tube Q8H  . furosemide  40 mg Oral Daily  . heparin subcutaneous  5,000 Units Subcutaneous Q8H  . insulin aspart  0-15 Units Subcutaneous Q4H  . lisinopril  20 mg Per Tube Daily  . oxybutynin  5 mg Per Tube Daily  . pantoprazole sodium  40 mg Per Tube Daily  . potassium chloride  20 mEq Oral Daily    Continuous Infusions: . sodium chloride 10 mL/hr (04/26/13 2215)  . feeding supplement (JEVITY 1.2 CAL) 1,000 mL (04/27/13 0128)    Florentina Addison  Ranae Palms, RD, LDN Pager #: [phone] After-Hours Pager #: [phone]

## 2013-04-27 NOTE — Progress Notes (Signed)
Trach secure. Sutures still in place. No breakdown noted at trach site.

## 2013-04-27 NOTE — Progress Notes (Signed)
Patient Name: [name] [name] Date of Encounter: 04/27/2013     Principal Problem:   ST elevation myocardial infarction (STEMI) of inferolateral wall Active Problems:   CAD (coronary artery disease)   History of CVA (cerebrovascular accident)   History of TIA (transient ischemic attack)   VT (ventricular tachycardia)   Atrial fibrillation with RVR   Pulmonary mass   Aspiration pneumonia   Acute-on-chronic respiratory failure   Anemia   Tracheostomy status    SUBJECTIVE  Awake. Follows command. S/P tracheostomy 04/19/13 has had no further bleeding around trach. His wife in room feels like he is doing well today. No chest pain or dyspnea. Has good cough. Wife is apprehensive about caring for trach and giving insulin shots. Wants to know if he can eat.   CURRENT MEDS . amiodarone  400 mg Per Tube Daily  . antiseptic oral rinse  15 mL Mouth Rinse QID  . aspirin  81 mg Per J Tube Daily  . atorvastatin  80 mg Per Tube q1800  . carvedilol  12.5 mg Per Tube BID WC  . chlorhexidine  15 mL Mouth Rinse BID  . free water  200 mL Per Tube Q8H  . furosemide  40 mg Oral Daily  . heparin subcutaneous  5,000 Units Subcutaneous Q8H  . insulin aspart  0-15 Units Subcutaneous Q4H  . lisinopril  20 mg Per Tube Daily  . oxybutynin  5 mg Per Tube Daily  . pantoprazole sodium  40 mg Per Tube Daily  . potassium chloride  20 mEq Oral Daily    OBJECTIVE  Filed Vitals:   04/27/13 0825 04/27/13 0914 04/27/13 1152 04/27/13 1200  BP: 140/75 140/75 147/80 147/80  Pulse: 70 73 69 75  Temp:    97.4 F (36.3 C)  TempSrc:    Axillary  Resp: 28  27 33  Height:      Weight:      SpO2: 98%  99% 99%    Intake/Output Summary (Last 24 hours) at 04/27/13 1401 Last data filed at 04/27/13 1337  Gross per 24 hour  Intake   2875 ml  Output   2350 ml  Net    525 ml   Filed Weights   04/25/13 0500 04/26/13 0441 04/27/13 0500  Weight: 161 lb 13.1 oz (73.4 kg) 160 lb 0.9 oz (72.6 kg) 169 lb 5 oz  (76.8 kg)    PHYSICAL EXAM  Pt is alert, NAD Chronically ill appearing  HEENT: normal  Neck: + Trach collar. Lungs: coarse BS bilaterally. CV: RRR, Soft systolic murmur at base. Abd: soft, NT, Positive BS, no hepatomegaly  Ext: tr edema. +contractures  Skin: warm/dry no rash   Accessory Clinical Findings  CBC  Recent Labs  04/26/13 0500 04/27/13 0515  WBC 10.4 11.2*  HGB 8.1* 8.6*  HCT 25.4* 26.9*  MCV 96.2 96.8  PLT 315 369   Basic Metabolic Panel  Recent Labs  04/27/13 0515  NA 140  K 3.6  CL 101  CO2 27  GLUCOSE 116*  BUN 28*  CREATININE 0.82  CALCIUM 8.8  MG 2.0  PHOS 3.1    TELE  NSR with occ PVCs. 7-8 beat runs of NSVT  ECG    Radiology/Studies  Dg Chest Port 1 View  04/22/2013   CLINICAL DATA:  Bleeding around trach site.  EXAM: PORTABLE CHEST - 1 VIEW  COMPARISON:  04/21/2013 at 1228 hr.  FINDINGS: 2355 hr. Right-sided PICC line that terminates at the low SVC or  high right atrium. Numerous leads and wires project over the chest. Tracheostomy remains appropriately positioned. Mild cardiomegaly. No pleural effusion or pneumothorax. Mild interstitial edema which is asymmetric, and greater on the left than right. Improved left base aeration with mild airspace disease remaining. Right upper lobe nodular density is partially obscured by overlying wire.  IMPRESSION: Appropriate position of tracheostomy, without explanation for bleeding.  Persistent asymmetric interstitial edema with improved left base airspace disease.  Right upper lobe nodular density, slightly less distinct than on the exam of earlier today. Recommend attention on followup.   Electronically Signed   By: Jeronimo Greaves M.D.   On: 04/22/2013 00:47   Dg Chest Port 1 View  04/21/2013   CLINICAL DATA:  Difficulty weaning the patient, history of pulmonary edema  EXAM: PORTABLE CHEST - 1 VIEW  COMPARISON:  Chest x-ray dated April 19, 2013. And November 30th 2014  FINDINGS: The patient is  rotated on this study. The lungs are reasonably well inflated. There remain increased interstitial markings especially on the left. . A nodular mass persists in the right upper hemi thorax. No pleural effusion is demonstrated. The cardiopericardial silhouette is enlarged. The sub [place] venous catheter tip lies in the region of the junction of the SVC with the right atrium. The endotracheal collection the tracheostomy appliance tip lies approximately 4 cm above the crotch of the carina.  IMPRESSION: 1. The findings suggest persistent mild interstitial edema secondary to CHF. 2. A pulmonary nodule or mass is present in the right upper lobe measuring approximately 2.3 cm in greatest dimension.  Dg Chest Port 1 View    ASSESSMENT AND PLAN  1. Paroxysmal Atrial fibrillation-probably related to acute PNA/respiratory failure - CHADS = 4. Coumadin stopped due to bleeding around trach and anemia. Maintaining sinus rhythm on amiodarone. Given high CHADS score would ideally favor anti-coagulation but did not tolerate here in hospital due to bleeding. Since he is maintaining NSR on amio we will just continue ASA for now. 2. Acute on chronic respiratory failure. Now s/p trach. PCCM following. Will need input on disposition. Seems to be tolerating trach collar. Wife will need training for trach care if he is to go home. I informed her he could not eat given risk of aspiration. Would need clearance from speech path before resuming any po. 3. CHF - acute on chronic diastolic. Continue lasix 40 mg daily. Volume status looks good.  4. H/o CVA's  5. CAD s/p ? Inferior STEMI on admit. Cath with 95% lesion in nondominant RCA. Left system OK. Treated medically with b-blocker, asa, statin.    Signed, Robie Oats [place]  MD,FACC 04/27/2013 2:01 PM

## 2013-04-27 NOTE — Progress Notes (Signed)
PULMONARY  / CRITICAL CARE MEDICINE  Name: [name] [name] MRN: [id number] DOB: 07/24/1940    ADMISSION DATE:  04/09/2013 CONSULTATION DATE:  04/09/2013  REFERRING MD :  Cardiology PRIMARY SERVICE: PCCM  CHIEF COMPLAINT:  Acute respiratory failure  BRIEF PATIENT DESCRIPTION: 72 y/o M with history of multiple CVA (aphasia, PEG) admitted with respiratory failure in setting of pneumonia and STEMI.  SIGNIFICANT EVENTS / STUDIES:  11/21 - Admitted with respiratory failure in setting of pneumonia and STEMI 11/21 - Cardiac cath >>> 95% occluded RCA, no intervention due to non dominant nature 11/22 - TTE >>> EF 45-50%, abnormal relaxation, mild aortic regurgitation 11/29 - Remains on vent, pending trach on 12/1 per ENT 12/01 - Tracheostomy by ENT completed 12/03 - Bleeding around trach site noted  LINES / TUBES: Foley 11/21 >>> PEG ??? >>> R fem CVL 11/21 >>> out R fem A-Line 11/21 >>> out R PICC 12/1 >>>  CULTURES: 11/21 Blood >>> Neg 11/22 Sputum >>> WBCs, no organisms  ANTIBIOTICS: Azithromycin 11/21 >>>11/28 Ceftriaxone 11/21>>>11/30  SUBJECTIVE:   Pt sleepy this am but arousable.  On trach collar with increased RR but no increased wob.    VITAL SIGNS: Temp:  [98.2 F (36.8 C)-99 F (37.2 C)] 98.2 F (36.8 C) (12/09 0814) Pulse Rate:  [41-76] 73 (12/09 0914) Resp:  [22-38] 28 (12/09 0825) BP: (135-167)/(75-90) 140/75 mmHg (12/09 0914) SpO2:  [97 %-100 %] 98 % (12/09 0825) FiO2 (%):  [28 %] 28 % (12/09 0825) Weight:  [76.8 kg (169 lb 5 oz)] 76.8 kg (169 lb 5 oz) (12/09 0500)  HEMODYNAMICS:   VENTILATOR SETTINGS: Vent Mode:  [-]  FiO2 (%):  [28 %] 28 %  INTAKE / OUTPUT: Intake/Output     12/08 0701 - 12/09 0700 12/09 0701 - 12/10 0700   NG/GT 2875    Total Intake(mL/kg) 2875 (37.4)    Urine (mL/kg/hr) 1325 (0.7) 225 (0.9)   Stool 800 (0.4)    Total Output 2125 225   Net +750 -225         PHYSICAL EXAMINATION: General:  Wd male nad on tc Neuro:  arousable, FC HEENT:  Trach site clean,no nasal discharge Cardiovascular:  Mild tachy, no murmurs Lungs:  Decreased depth inspiration, a few scattered crackles. Abdomen:  Soft, PEG site intact, bowel sounds present Musculoskeletal:  Torticollis, contractures, bilateral foot drop with boots on   LABS:  CBC  Recent Labs Lab 04/25/13 0500 04/26/13 0500 04/27/13 0515  WBC 12.5* 10.4 11.2*  HGB 7.7* 8.1* 8.6*  HCT 24.8* 25.4* 26.9*  PLT 300 315 369   Coag's  Recent Labs Lab 04/22/13 0600  04/25/13 0500 04/26/13 0500 04/27/13 0515  APTT 33  --   --   --   --   INR 1.85*  < > 1.54* 1.40 1.17  < > = values in this interval not displayed. BMET  Recent Labs Lab 04/23/13 0430 04/24/13 0500 04/27/13 0515  NA 147* 143 140  K 4.2 4.3 3.6  CL 108 104 101  CO2 33* 32 27  BUN 29* 32* 28*  CREATININE 0.89 0.87 0.82  GLUCOSE 143* 131* 116*   Electrolytes  Recent Labs Lab 04/23/13 0430 04/24/13 0500 04/27/13 0515  CALCIUM 8.7 8.6 8.8  MG  --   --  2.0  PHOS  --   --  3.1   Sepsis Markers No results found for this basename: LATICACIDVEN, PROCALCITON, O2SATVEN,  in the last 168 hours ABG No results found for this  basename: PHART, PCO2ART, PO2ART,  in the last 168 hours Liver Enzymes No results found for this basename: AST, ALT, ALKPHOS, BILITOT, ALBUMIN,  in the last 168 hours Cardiac Enzymes No results found for this basename: TROPONINI, PROBNP,  in the last 168 hours Glucose  Recent Labs Lab 04/26/13 1213 04/26/13 1542 04/26/13 2023 04/27/13 0039 04/27/13 0456 04/27/13 0817  GLUCAP 124* 121* 133* 91 116* 111*    ASSESSMENT / PLAN:  PULMONARY A:  Acute respiratory failure in setting of pneumonia (CAP vs aspiration)  --> Failed wean  --> s/p Tracheostomy 04/19/13 Possibly element of pulmonary edema in setting of VT. Pulmonary nodule RUL. Bleeding from trach site - resolved 12/5  The pt seems to be stable from a pulmonary standpoint, and tolerating  TC currently.  Will continue weaning during day as tolerated.   P:   - Trach collar as tolerated - goal 24h/7 at this point. - ENT following. - CT chest once acute issues resolved to clarify nodule. - Wife wants to take patient home, RT to train patient on trach care prior to discharge and CSW is aware.  CARDIOVASCULAR A:  VT resolved with defibrillation.  STEMI s/p cardiac cath without intervention.  H/o CAD, HTN, AF. 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 Atrial Fibrillation  P:  - Cardiology following, appreciate input.  INFECTIOUS A:   Pneumonia (aspiration vs CAP), PCT 92 Mild Trending Up Leukocytosis - trending down 12/3  P:   - Monitor fever curve and WBCs, remains stable - No abx at this time.  Alyson Reedy, M.D. [facility] Pulmonary/Critical Care Medicine. Pager: [phone]. After hours pager: [phone].

## 2013-04-27 NOTE — Progress Notes (Signed)
Patient tolerating ATC well. Increased RR but increased WOB noted. No distress.  Vent on standby in room. RT will continue to monitor.

## 2013-04-28 ENCOUNTER — Inpatient Hospital Stay (HOSPITAL_COMMUNITY): Payer: Medicare Other

## 2013-04-28 DIAGNOSIS — D649 Anemia, unspecified: Secondary | ICD-10-CM

## 2013-04-28 DIAGNOSIS — J9620 Acute and chronic respiratory failure, unspecified whether with hypoxia or hypercapnia: Principal | ICD-10-CM

## 2013-04-28 LAB — CBC
HCT: 23.9 % — ABNORMAL LOW (ref 39.0–52.0)
Hemoglobin: 7.7 g/dL — ABNORMAL LOW (ref 13.0–17.0)
MCH: 31.2 pg (ref 26.0–34.0)
MCHC: 32.2 g/dL (ref 30.0–36.0)
Platelets: 337 10*3/uL (ref 150–400)
RBC: 2.47 MIL/uL — ABNORMAL LOW (ref 4.22–5.81)
WBC: 8.8 10*3/uL (ref 4.0–10.5)

## 2013-04-28 LAB — GLUCOSE, CAPILLARY
Glucose-Capillary: 100 mg/dL — ABNORMAL HIGH (ref 70–99)
Glucose-Capillary: 101 mg/dL — ABNORMAL HIGH (ref 70–99)
Glucose-Capillary: 114 mg/dL — ABNORMAL HIGH (ref 70–99)
Glucose-Capillary: 117 mg/dL — ABNORMAL HIGH (ref 70–99)
Glucose-Capillary: 124 mg/dL — ABNORMAL HIGH (ref 70–99)
Glucose-Capillary: 90 mg/dL (ref 70–99)

## 2013-04-28 LAB — PROTIME-INR
INR: 1.21 (ref 0.00–1.49)
Prothrombin Time: 15 seconds (ref 11.6–15.2)

## 2013-04-28 MED ORDER — CARVEDILOL 25 MG PO TABS
25.0000 mg | ORAL_TABLET | Freq: Two times a day (BID) | ORAL | Status: DC
  Administered 2013-04-28 – 2013-04-30 (×6): 25 mg
  Filled 2013-04-28 (×6): qty 1

## 2013-04-28 NOTE — Progress Notes (Signed)
Report called to Tina, RN.

## 2013-04-28 NOTE — Progress Notes (Signed)
Patient Name: [name] [name] Date of Encounter: 04/28/2013  Principal Problem:   ST elevation myocardial infarction (STEMI) of inferolateral wall Active Problems:   CAD (coronary artery disease)   History of CVA (cerebrovascular accident)   History of TIA (transient ischemic attack)   VT (ventricular tachycardia)   Atrial fibrillation with RVR   Pulmonary mass   Aspiration pneumonia   Acute-on-chronic respiratory failure   Anemia   Tracheostomy status    SUBJECTIVE: Pt nods to questions, denies chest pain or SOB.  OBJECTIVE Filed Vitals:   04/28/13 0011 04/28/13 0356 04/28/13 0444 04/28/13 0738  BP: 166/87  144/75 176/79  Pulse:  64 62 67  Temp:   97.5 F (36.4 C)   TempSrc:   Axillary   Resp:  28 30 33  Height:      Weight:   167 lb 12.3 oz (76.1 kg)   SpO2:  100% 100% 100%    Intake/Output Summary (Last 24 hours) at 04/28/13 0803 Last data filed at 04/28/13 1610  Gross per 24 hour  Intake   1960 ml  Output   2275 ml  Net   -315 ml   Filed Weights   04/26/13 0441 04/27/13 0500 04/28/13 0444  Weight: 160 lb 0.9 oz (72.6 kg) 169 lb 5 oz (76.8 kg) 167 lb 12.3 oz (76.1 kg)    PHYSICAL EXAM General: Well developed, well nourished, male in no acute distress. Head: Normocephalic, atraumatic.  Neck: Supple without bruits, JVD not elevated, but difficult to assess secondary to equipment, position Lungs:  Resp regular and unlabored, good air exchange. Heart: RRR, S1, S2, no S3, S4, or murmur; no rub. Abdomen: Soft, non-tender, non-distended, BS + x 4.  Extremities: No clubbing, cyanosis, no edema.  Neuro: Alert and awake  LABS: CBC: Recent Labs  04/27/13 0515 04/28/13 0530  WBC 11.2* 8.8  HGB 8.6* 7.7*  HCT 26.9* 23.9*  MCV 96.8 96.8  PLT 369 337   INR: Recent Labs  04/28/13 0530  INR 1.21   Basic Metabolic Panel: Recent Labs  04/27/13 0515  NA 140  K 3.6  CL 101  CO2 27  GLUCOSE 116*  BUN 28*  CREATININE 0.82  CALCIUM 8.8  MG  2.0  PHOS 3.1   Lab Results  Component Value Date   HGBA1C 5.5 04/09/2013    TELE:  SR, multiple PVCs and pairs.  Current Medications:  . amiodarone  400 mg Per Tube Daily  . antiseptic oral rinse  15 mL Mouth Rinse QID  . aspirin  81 mg Per J Tube Daily  . atorvastatin  80 mg Per Tube q1800  . carvedilol  12.5 mg Per Tube BID WC  . chlorhexidine  15 mL Mouth Rinse BID  . free water  200 mL Per Tube Q8H  . furosemide  40 mg Oral Daily  . heparin subcutaneous  5,000 Units Subcutaneous Q8H  . insulin aspart  0-15 Units Subcutaneous Q4H  . lisinopril  20 mg Per Tube Daily  . oxybutynin  5 mg Per Tube Daily  . pantoprazole sodium  40 mg Per Tube Daily  . potassium chloride  20 mEq Oral Daily   . sodium chloride 10 mL/hr (04/26/13 2215)  . feeding supplement (JEVITY 1.2 CAL) 1,000 mL (04/27/13 1938)    ASSESSMENT AND PLAN:  1. Paroxysmal Atrial fibrillation-probably related to acute PNA/respiratory failure - CHADS = 4. Coumadin stopped due to bleeding around trach and anemia. Maintaining sinus rhythm on amiodarone.  Given high CHADS score would ideally favor anti-coagulation but did not tolerate here in hospital due to bleeding. Since he is maintaining NSR on amio we will just continue ASA 81 mg for now.    2. Acute on chronic respiratory failure. Now s/p trach. PCCM following. Per CCM, tolerating trach collar well. Dr. Redmond School was following as OP, will manage trach. Wife will need training for trach care if he is to go home - this is scheduled for today. I informed her he could not eat given risk of aspiration. Would need clearance from speech path before resuming any po.  Oxygen levels are maintained off O2, so he does not currently qualify for home O2. Trach sutures need to come out prior to d/c, CCM to tell respiratory to take them out today.  3. CHF - acute on chronic diastolic. Continue lasix 40 mg daily. Volume status looks good.   4. H/o CVA's - brain stem CVA 2000. Wife has  cared for him at home since then. Feeding tube to be changed in January. She has general medical equipment, will only need trach supplies.   5. CAD s/p ? Inferior STEMI on admit. Cath with 95% lesion in nondominant RCA. Left system OK. Treated medically with b-blocker, asa, statin.   6. HTN - SBP 130s-160s on current medical therapy, MD advise on increasing Coreg  12.5->18.75 mg BID.   Plan: Wife to get trach training today. [facility] can deliver equipment tomorrow am if order placed early enough today. Wife wants trach equipment delivered before pt gets there. D/c when medically stable.   Principal Problem:   ST elevation myocardial infarction (STEMI) of inferolateral wall Active Problems:   CAD (coronary artery disease)   History of CVA (cerebrovascular accident)   History of TIA (transient ischemic attack)   VT (ventricular tachycardia)   Atrial fibrillation with RVR   Pulmonary mass   Aspiration pneumonia   Acute-on-chronic respiratory failure   Anemia   Tracheostomy status   Signed, Theodore Demark , PA-C 8:03 AM 04/28/2013 Patient seen and examined and history reviewed. Agree with above findings and plan. Patient continues to make good progress. Was hypertensive last night. Will increase carvediolol to 25 mg bid. Continue ASA 81 mg daily. OK to transfer to telemetry today. Anticipate DC home on Friday. Preparations with home health underway. Training for wife for trach care. I would continue amiodarone at current dose for one month then reduce to 200 mg daily. Will need follow up labs (LFTs, TSH) with primary MD. Wife is under the impression that he is to have a chest CT to follow up pulmonary nodule- Not clear whether this is to be done this hospitalization or later as outpatient.  Theron Arista [facility] 04/28/2013 9:30 AM

## 2013-04-28 NOTE — Progress Notes (Addendum)
Chest x-ray reviewed, nodule seen that has persisted despite clearing of previous airspace opacities. Spoke with Dr. Bradly Chris with radiology and he recommended chest CT for evaluation, contrast not necessary. Have ordered.  Called Dr. Pilar Grammes office regarding followup. Faxed over some information.  Based on the information currently available, they feel they will be able to manage his trach. Their fax number is [phone].  They requested the discharge summary include detailed information on a trach (type and size).   They request a copy of the discharge summary be faxed to them.   They recommended home health be continued and I advised that [facility] is being contacted by case management.

## 2013-04-28 NOTE — Progress Notes (Signed)
PULMONARY  / CRITICAL CARE MEDICINE  Name: [name] [name] MRN: [id number] DOB: [birth year]    ADMISSION DATE:  04/09/2013 CONSULTATION DATE:  04/09/2013  REFERRING MD :  Cardiology PRIMARY SERVICE: PCCM  CHIEF COMPLAINT:  Acute respiratory failure  BRIEF PATIENT DESCRIPTION: 72 y/o M with history of multiple CVA (aphasia, PEG) admitted with respiratory failure in setting of pneumonia and STEMI.  SIGNIFICANT EVENTS / STUDIES:  11/21 - Admitted with respiratory failure in setting of pneumonia and STEMI 11/21 - Cardiac cath >>> 95% occluded RCA, no intervention due to non dominant nature 11/22 - TTE >>> EF 45-50%, abnormal relaxation, mild aortic regurgitation 11/29 - Remains on vent, pending trach on 12/1 per ENT 12/01 - Tracheostomy by ENT completed 12/03 - Bleeding around trach site noted 12/10- remains in no distress on TC  LINES / TUBES: Foley 11/21 >>> PEG ??? >>> R fem CVL 11/21 >>> out ETT 11/21>>>12/1 Trach (shoemaker) 12/1>>> R fem A-Line 11/21 >>> out R PICC 12/1 >>>  CULTURES: 11/21 Blood >>> Neg 11/22 Sputum >>> WBCs, no organisms  ANTIBIOTICS: Azithromycin 11/21 >>>11/28 Ceftriaxone 11/21>>>11/30  SUBJECTIVE:   HTN overnight.  Working towards d/c.   VITAL SIGNS: Temp:  [97.4 F (36.3 C)-98.7 F (37.1 C)] 98.7 F (37.1 C) (12/10 0819) Pulse Rate:  [62-76] 67 (12/10 0819) Resp:  [19-40] 34 (12/10 0819) BP: (130-208)/(67-191) 179/79 mmHg (12/10 0819) SpO2:  [92 %-100 %] 100 % (12/10 0819) FiO2 (%):  [21 %-28 %] 28 % (12/10 0819) Weight:  [167 lb 12.3 oz (76.1 kg)] 167 lb 12.3 oz (76.1 kg) (12/10 0444)  HEMODYNAMICS:   VENTILATOR SETTINGS: Vent Mode:  [-]  FiO2 (%):  [21 %-28 %] 28 %  INTAKE / OUTPUT: Intake/Output     12/09 0701 - 12/10 0700 12/10 0701 - 12/11 0700   NG/GT 1960    Total Intake(mL/kg) 1960 (25.8)    Urine (mL/kg/hr) 2125 (1.2) 150 (1.2)   Stool     Total Output 2125 150   Net -165 -150         PHYSICAL  EXAMINATION: General:  Wd male nad on tc Neuro: awake and alert HEENT:  Trach site clean,no nasal discharge Cardiovascular:  Mild tachy, no murmurs Lungs:  resps even non labored on ATC, diminished bases  Abdomen:  Soft, PEG site intact, bowel sounds present Musculoskeletal:  Contractures, bilateral foot drop with boots on   LABS:  CBC  Recent Labs Lab 04/26/13 0500 04/27/13 0515 04/28/13 0530  WBC 10.4 11.2* 8.8  HGB 8.1* 8.6* 7.7*  HCT 25.4* 26.9* 23.9*  PLT 315 369 337   Coag's  Recent Labs Lab 04/22/13 0600  04/26/13 0500 04/27/13 0515 04/28/13 0530  APTT 33  --   --   --   --   INR 1.85*  < > 1.40 1.17 1.21  < > = values in this interval not displayed. BMET  Recent Labs Lab 04/23/13 0430 04/24/13 0500 04/27/13 0515  NA 147* 143 140  K 4.2 4.3 3.6  CL 108 104 101  CO2 33* 32 27  BUN 29* 32* 28*  CREATININE 0.89 0.87 0.82  GLUCOSE 143* 131* 116*   Electrolytes  Recent Labs Lab 04/23/13 0430 04/24/13 0500 04/27/13 0515  CALCIUM 8.7 8.6 8.8  MG  --   --  2.0  PHOS  --   --  3.1   Sepsis Markers No results found for this basename: LATICACIDVEN, PROCALCITON, O2SATVEN,  in the last 168 hours ABG No results  found for this basename: PHART, PCO2ART, PO2ART,  in the last 168 hours Liver Enzymes No results found for this basename: AST, ALT, ALKPHOS, BILITOT, ALBUMIN,  in the last 168 hours Cardiac Enzymes No results found for this basename: TROPONINI, PROBNP,  in the last 168 hours Glucose  Recent Labs Lab 04/27/13 0817 04/27/13 1331 04/27/13 1744 04/27/13 2044 04/28/13 0002 04/28/13 0443  GLUCAP 111* 106* 110* 109* 103* 101*    ASSESSMENT / PLAN:  PULMONARY A:  Acute respiratory failure in setting of pneumonia (CAP vs aspiration) s/p trach  Pulmonary edema in setting of VT. Pulmonary nodule RUL. Bleeding from trach site - resolved 12/5  The pt seems to be stable from a pulmonary standpoint, and tolerating TC currently.  Will  continue weaning during day as tolerated.   P:   - Cont trach collar as tolerated  - ENT following. Consider dc sutures - CT chest as outpt once acute issues resolved to clarify nodule. - RT working on trach teaching   CARDIOVASCULAR A:  VT resolved with defibrillation.  STEMI s/p cardiac cath without intervention.  H/o CAD, HTN, AFib. 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 Atrial Fibrillation  P:  - Cardiology primary, appreciate input. - no anticoagulation due to bleeding/anemia  - Cont lasix  - B blocker, asa, statin   HEMATOLOGIC  Anemia  Trach bleeding resolved.  P:  -no anticoagulation  -f/u cbc    INFECTIOUS A:   Pneumonia (aspiration vs CAP), PCT 92 Mild Trending Up Leukocytosis - trending down 12/3  P:   - Monitor fever curve and WBCs, remains stable - No abx at this time.  Pt stable for d/c home from [facility] standpoint. Updated wife  Wife needs trach teaching.  ENT and PCP (Tripp) to manage trach after d/c. CM working on home equipment.  Anticipate d/c 12/11 or 12/12.    Danford Bad, NP 04/28/2013  8:42 AM Pager: [phone] or [phone]  *Care during the described time interval was provided by me and/or other providers on the critical care team. I have reviewed this patient's available data, including medical history, events of note, physical examination and test results as part of my evaluation.   I have fully examined this patient and agree with above findings.     Mcarthur Rossetti. Tyson Alias, MD, FACP Pgr: [phone] [facility]'

## 2013-04-28 NOTE — Progress Notes (Signed)
Plan is for wife to take pt home on Friday. Home health is involved. CSW signing off.    Maryclare Labrador, MSW, [facility] Clinical Social Worker [phone]

## 2013-04-28 NOTE — Progress Notes (Signed)
Trach education started with wife at this time. Wife seems overwhelmed with how much she is having to learn. Pt seems very agitated and is combative. Started with basic education, wife changed inner cannula, did trach care. She seemed to understand and demonstrated back well but will need further education before pt discharges home. She stated that she will not be present tomorrow but will be Friday. I let her know that we will back Friday to f/u and continue education.

## 2013-04-29 LAB — BASIC METABOLIC PANEL
BUN: 24 mg/dL — ABNORMAL HIGH (ref 6–23)
CO2: 30 mEq/L (ref 19–32)
Calcium: 8.3 mg/dL — ABNORMAL LOW (ref 8.4–10.5)
GFR calc Af Amer: >90 mL/min (ref >90)
Glucose, Bld: 89 mg/dL (ref 70–99)
Potassium: 3.9 mEq/L (ref 3.5–5.1)
Sodium: 136 mEq/L (ref 135–145)

## 2013-04-29 LAB — GLUCOSE, CAPILLARY
Glucose-Capillary: 111 mg/dL — ABNORMAL HIGH (ref 70–99)
Glucose-Capillary: 112 mg/dL — ABNORMAL HIGH (ref 70–99)

## 2013-04-29 LAB — CBC
HCT: 24.4 % — ABNORMAL LOW (ref 39.0–52.0)
Hemoglobin: 8 g/dL — ABNORMAL LOW (ref 13.0–17.0)
RBC: 2.53 MIL/uL — ABNORMAL LOW (ref 4.22–5.81)
RDW: 15.6 % — ABNORMAL HIGH (ref 11.5–15.5)
WBC: 9 10*3/uL (ref 4.0–10.5)

## 2013-04-29 LAB — PROTIME-INR: INR: 1.27 (ref 0.00–1.49)

## 2013-04-29 MED ORDER — SODIUM CHLORIDE 0.9 % IJ SOLN
10.0000 mL | INTRAMUSCULAR | Status: DC | PRN
  Administered 2013-04-29 – 2013-04-30 (×2): 10 mL

## 2013-04-29 MED ORDER — HYDRALAZINE HCL 10 MG PO TABS
10.0000 mg | ORAL_TABLET | Freq: Three times a day (TID) | ORAL | Status: DC
  Administered 2013-04-29 – 2013-04-30 (×4): 10 mg via ORAL
  Filled 2013-04-29 (×7): qty 1

## 2013-04-29 NOTE — Progress Notes (Signed)
At 0520, pt had 6 beat run of vtach.  Pt asymptomatic  Last BMP and mag drawn 04/27/13.  MD notified.  BMP and Mag ordered for this morning.  Will continue to monitor.

## 2013-04-29 NOTE — Progress Notes (Addendum)
Physical Therapy Treatment Patient Details Name: [name] [name] MRN: [id number] DOB: November 28, 1940 Today's Date: 04/29/2013 Time: 4098-1191 PT Time Calculation (min): 33 min  PT Assessment / Plan / Recommendation  History of Present Illness 72 y/o M with history of multiple CVA (aphasia, PEG) admitted with respiratory failure in setting of pneumonia and STEMI.    PT Comments   Pt admitted with above. Pt currently with functional limitations due to the deficits listed below (see PT Problem List). Per report, he is at baseline level of function and wife is agreeable to continue care at home.  Pt will benefit from skilled PT to increase their independence and safety with mobility to allow discharge to the venue listed below.  Family will need education about mucus management for safety at home.   Follow Up Recommendations  No PT follow up;Supervision/Assistance - 24 hour           Equipment Recommendations  None recommended by PT       Frequency Min 2X/week   Progress towards PT Goals Progress towards PT goals: Progressing toward goals  Plan Current plan remains appropriate    Precautions / Restrictions Precautions Precautions: Fall Precaution Comments: right hemiplegia, torticollis, peg, trach, vent Restrictions Weight Bearing Restrictions: No   Pertinent Vitals/Pain None reported,  His O2 sats went down to 65%, but quickly rose back up to 90% within seconds of sitting.  Patient was eager to lay back down.  Pt is on 28% TC and 5 L O2    Mobility  Bed Mobility Bed Mobility: Supine to Sit Supine to Sit: 1: +2 Total assist Supine to Sit: Patient Percentage: 10% Details for Bed Mobility Assistance: Hand over hand A for pt to pull into sitting.  Is unable to reach over to rail to pull himself today, unsure if it's due to his lack of desire to move vs ability to reach over.  One person to assist UE and trunk when pulling to sitting, and the other person to assist in moving legs off  bed.  R LE is flaccid and L LE has very limited movement.   Transfers Transfers: Not assessed Ambulation/Gait Ambulation/Gait Assistance: Not tested (comment)      PT Goals (current goals can now be found in the care plan section) Acute Rehab PT Goals PT Goal Formulation: With family Time For Goal Achievement: 05/06/13 Potential to Achieve Goals: Fair  Visit Information  Last PT Received On: 04/29/13 Assistance Needed: +2 History of Present Illness: 72 y/o M with history of multiple CVA (aphasia, PEG) admitted with respiratory failure in setting of pneumonia and STEMI.     Subjective Data  Subjective: Shakes head yes and no   Cognition  Cognition Arousal/Alertness: Awake/alert Behavior During Therapy: Flat affect Overall Cognitive Status: History of cognitive impairments - at baseline    Balance  Balance Balance Assessed: Yes Static Sitting Balance Static Sitting - Balance Support: Bilateral upper extremity supported;Feet supported Static Sitting - Level of Assistance: 1: +2 Total assist Static Sitting - Comment/# of Minutes: Pt sat EOB for 5 min with A.  One person to support trunk, and the other the help his position his UE on bed to allow WB and support to come from them.  He is unable to support his head and may need A during coughing with his head.  Upon sitting up he had a couple coughs with thick mucus being excreted through trach.  His O2 sats went down to 65%, but quickly rose back up to  90% within seconds of sitting.  Patient was eager to lay back down.  End of Session     GP    Barrie Dunker, SPT Pager:  [phone]  Read, reviewed, and agree.  Rollene Rotunda Melinna Linarez, PT, DPT [phone]  04/29/2013, 1:22 PM

## 2013-04-29 NOTE — Evaluation (Signed)
[facility] - Evaluation Patient Details  Name: [name] [name] MRN: [id number] Date of Birth: 06/16/1940  Today's Date: 04/29/2013 Time: 1350-1405 SLP Time Calculation (min): 15 min  Past Medical History:  Past Medical History  Diagnosis Date  . CVA (cerebral infarction)     a. 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 b. Residual aphasia and torticollis. Does not ambulate - uses a motorized chair.  Marland Kitchen TIA (transient ischemic attack)     a. 2013 around time of pneumonia - x3  . Pneumonia   . History of jejunostomy tube placement     a. Per wife due to strokes.  . Irregular heartbeat     a. Per wife. No hx of blood thinners or complications from blood thinners.  . Enlarged heart     a. Per wife.  Marland Kitchen HTN (hypertension)   . Pulmonary mass     a. Wife states has been followed with serial cxrs.   Past Surgical History:  Past Surgical History  Procedure Laterality Date  . Tracheostomy tube placement N/A 04/19/2013    Procedure: TRACHEOSTOMY;  Surgeon: Osborn Coho, MD;  Location: [facility] OR;  Service: ENT;  Laterality: N/A;   HPI:  72 y/o M with history of multiple CVA/TIAs (residual aphasia, chronic torticollis-like neck alignment and J-tube placement), irregular heartbeat, enlarged heart, "small pulmonary mass" and HTN who presented to [facility] today with STEMI, rapid AF, acute respiratory failure, and VT.  She says he has baseline aphasia at home but is still able to communicate. She reports that last night he developed worsening SOB. He has dyspnea at times but this was worse. This morning however he complained of increased SOB. She gave him juice and he began coughing. She says he takes Jevity/meds through his g-tube but states that he also does eat at home. She called EMS who found the patient to be in respiratory distress and hypotensive at 71/54. He was in rapid atrial fibrillation with ST elevation in II, avF, V2-V6. Code STEMI was called. He also began to have NSVT  thus was placed on amio gtt without bolus by EMS. In the ED, breathing became agonal so he was intubated and trach'd 12/1.   Assessment / Plan / Recommendation Clinical Impression  Pt. coughing thick, copious secretions from trach when SLP entered room for PMSV eval. Trach deep suctioned by RN following cuff deflation via SLP. Pt. unable to tolerate cuff deflation as evidenced by hard, reflexive continuous coughing (stopped for 30 seconds). SLP inflated cuff and explained clinical reasoning inability to place. Minimal intermittent vocalizations present during cuff deflation. SLP will continue to follow for PMSV placement.     SLP Assessment  Patient needs continued Speech Lanaguage Pathology Services    Follow Up Recommendations  Home health SLP    Frequency and Duration min 2x/week  2 weeks   Pertinent Vitals/Pain WDL    SLP Goals Potential to Achieve Goals: Fair Potential Considerations: Ability to learn/carryover information;Severity of impairments   PMSV Trial  PMSV was placed for:  (unable to place, not tolerating cuff deflation) Able to redirect subglottic air through upper airway: No Able to Expectorate Secretions: Yes Level of Secretion Expectoration with PMSV: Tracheal Respirations During Trial:  (WDL) SpO2 During Trial:  (99) Pulse During Trial: 70   Tracheostomy Tube       Vent Dependency       Cuff Deflation Trial Tolerated Cuff Deflation: No Length of Time for Cuff Deflation Trial: < 5 min Behavior:  Cooperative;Alert   Breck Coons Chloey Ricard M.Ed [employer] [phone]  04/29/2013

## 2013-04-29 NOTE — Progress Notes (Signed)
Pt refusing turns every 2 hours, refusing foley care, refusing mouth care, refusing suctioning.  Pt attempting to hit RN and RT.  Will continue to attempt to give care.

## 2013-04-29 NOTE — Progress Notes (Addendum)
PULMONARY  / CRITICAL CARE MEDICINE  Name: [name] [name] MRN: [id number] DOB: 1940/09/01    ADMISSION DATE:  04/09/2013 CONSULTATION DATE:  04/09/2013  REFERRING MD :  Cardiology PRIMARY SERVICE: PCCM  CHIEF COMPLAINT:  Acute respiratory failure  BRIEF PATIENT DESCRIPTION: 72 y/o M with history of multiple CVA (aphasia, PEG) admitted with respiratory failure in setting of pneumonia and STEMI.  SIGNIFICANT EVENTS / STUDIES:  11/21 - Admitted with respiratory failure in setting of pneumonia and STEMI 11/21 - Cardiac cath >>> 95% occluded RCA, no intervention due to non dominant nature 11/22 - TTE >>> EF 45-50%, abnormal relaxation, mild aortic regurgitation 11/29 - Remains on vent, pending trach on 12/1 per ENT 12/01 - Tracheostomy by ENT completed 12/03 - Bleeding around trach site noted 12/10- remains in no distress on TC  LINES / TUBES: Foley 11/21 >>> PEG ??? >>> R fem CVL 11/21 >>> out ETT 11/21>>>12/1 Trach (shoemaker) 12/1>>> R fem A-Line 11/21 >>> out R PICC 12/1 >>>  CULTURES: 11/21 Blood >>> Neg 11/22 Sputum >>> WBCs, no organisms  ANTIBIOTICS: Azithromycin 11/21 >>>11/28 Ceftriaxone 11/21>>>11/30  SUBJECTIVE:   HTN overnight.  Working towards d/c.   VITAL SIGNS: Temp:  [97.9 F (36.6 C)-98.8 F (37.1 C)] 98.5 F (36.9 C) (12/11 0800) Pulse Rate:  [57-67] 67 (12/11 0900) Resp:  [16-32] 17 (12/11 0900) BP: (151-163)/(80-96) 156/88 mmHg (12/11 0900) SpO2:  [99 %-100 %] 100 % (12/11 0900) FiO2 (%):  [21 %-28 %] 21 % (12/11 0900) Weight:  [68.9 kg (151 lb 14.4 oz)] 68.9 kg (151 lb 14.4 oz) (12/11 0359)  HEMODYNAMICS:   VENTILATOR SETTINGS: Vent Mode:  [-]  FiO2 (%):  [21 %-28 %] 21 %  INTAKE / OUTPUT: Intake/Output     12/10 0701 - 12/11 0700 12/11 0701 - 12/12 0700   I.V. (mL/kg) 80 (1.2)    Other 9672    NG/GT 200    Total Intake(mL/kg) 9952 (144.4)    Urine (mL/kg/hr) 2300 (1.4)    Stool 400 (0.2)    Total Output 2700     Net +7252          PHYSICAL EXAMINATION: General:  Wd male nad on tc Neuro: awake and alert HEENT:  Trach site clean,no nasal discharge Cardiovascular:  Mild tachy, no murmurs Lungs:  resps even non labored on ATC, diminished bases  Abdomen:  Soft, PEG site intact, bowel sounds present Musculoskeletal:  Contractures, bilateral foot drop with boots on   LABS:  CBC  Recent Labs Lab 04/27/13 0515 04/28/13 0530 04/29/13 0540  WBC 11.2* 8.8 9.0  HGB 8.6* 7.7* 8.0*  HCT 26.9* 23.9* 24.4*  PLT 369 337 338   Coag's  Recent Labs Lab 04/27/13 0515 04/28/13 0530 04/29/13 0540  INR 1.17 1.21 1.27   BMET  Recent Labs Lab 04/24/13 0500 04/27/13 0515 04/29/13 0540  NA 143 140 136  K 4.3 3.6 3.9  CL 104 101 101  CO2 32 27 30  BUN 32* 28* 24*  CREATININE 0.87 0.82 0.83  GLUCOSE 131* 116* 89   Electrolytes  Recent Labs Lab 04/24/13 0500 04/27/13 0515 04/29/13 0540  CALCIUM 8.6 8.8 8.3*  MG  --  2.0 2.0  PHOS  --  3.1  --    Sepsis Markers No results found for this basename: LATICACIDVEN, PROCALCITON, O2SATVEN,  in the last 168 hours ABG No results found for this basename: PHART, PCO2ART, PO2ART,  in the last 168 hours Liver Enzymes No results found for this  basename: AST, ALT, ALKPHOS, BILITOT, ALBUMIN,  in the last 168 hours Cardiac Enzymes No results found for this basename: TROPONINI, PROBNP,  in the last 168 hours Glucose  Recent Labs Lab 04/28/13 1237 04/28/13 1720 04/28/13 2040 04/28/13 2353 04/29/13 0356 04/29/13 0759  GLUCAP 114* 90 124* 100* 99 111*    ASSESSMENT / PLAN:  PULMONARY A:  Acute respiratory failure in setting of pneumonia (CAP vs aspiration) s/p trach  Pulmonary edema in setting of VT. Pulmonary nodule RUL - CT chest confirms, also shows LUL nodule & mediastinal LN. Bleeding from trach site - resolved 12/5   P:   - Cont trach collar as tolerated  - ENT following. Consider dc sutures - Spoke to wife Claris Che - explained to her that  this is likely lung malignancy, not a candidate for further biopsy procedures or treatment, not sure that he can lie for RT simulation - RT working on trach teaching   CARDIOVASCULAR A:  VT resolved with defibrillation.  STEMI s/p cardiac cath without intervention.  H/o CAD, HTN, AFib. 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 Atrial Fibrillation  P:  - Cardiology primary, appreciate input. - no anticoagulation due to bleeding/anemia  - Cont lasix  - B blocker, asa, statin   HEMATOLOGIC  Anemia  Trach bleeding resolved.  P:  -no anticoagulation  -f/u cbc    INFECTIOUS A:   Pneumonia (aspiration vs CAP), PCT 92 Mild Trending Up Leukocytosis - trending down 12/3  P:   - Monitor fever curve and WBCs, remains stable - No abx at this time.  Pt stable for d/c home from [facility] standpoint. Updated wife  Wife needs trach teaching.  ENT and PCP (Tripp) to manage trach after d/c. CM working on home equipment.  Anticipate d/c 12/11 or 12/12.  I have explained to wife this is likely lung malignancy, not a candidate for further biopsy procedures or treatment, not sure that he can lie for RT simulation. Although RUL nodule can be biopsied under CT guidance, doubt we need to pursue this since we cannot offer him treatment. May need palliative care eventually if this gets worse. Patient cannot follow up in office hence will defer FU to PCP.  Addendum - I called Dr. Pilar Grammes office. I was told pt does not see him. He see's NP Levora Dredge  - I spoke with her about above. Her supervising physcian Dr. Maryruth Hancock [facility] V. 04/29/2013  9:59 AM  [phone]

## 2013-04-29 NOTE — Progress Notes (Signed)
Patient Name: [name] [name] Date of Encounter: 04/29/2013  Principal Problem:   ST elevation myocardial infarction (STEMI) of inferolateral wall Active Problems:   CAD (coronary artery disease)   History of CVA (cerebrovascular accident)   History of TIA (transient ischemic attack)   VT (ventricular tachycardia)   Atrial fibrillation with RVR   Pulmonary mass   Aspiration pneumonia   Acute-on-chronic respiratory failure   Anemia   Tracheostomy status    SUBJECTIVE: Pt nods to questions, denies chest pain or SOB.  OBJECTIVE Filed Vitals:   04/29/13 0359 04/29/13 0438 04/29/13 0800 04/29/13 0900  BP: 151/82  156/88 156/88  Pulse: 64 62 63 67  Temp: 98.5 F (36.9 C)  98.5 F (36.9 C)   TempSrc: Oral  Oral   Resp: 16 18 16 17   Height:      Weight: 151 lb 14.4 oz (68.9 kg)     SpO2: 99% 99% 100% 100%    Intake/Output Summary (Last 24 hours) at 04/29/13 1147 Last data filed at 04/29/13 0900  Gross per 24 hour  Intake   9952 ml  Output   2550 ml  Net   7402 ml   Filed Weights   04/27/13 0500 04/28/13 0444 04/29/13 0359  Weight: 169 lb 5 oz (76.8 kg) 167 lb 12.3 oz (76.1 kg) 151 lb 14.4 oz (68.9 kg)    PHYSICAL EXAM General: Well developed, well nourished, male in no acute distress. Head: Normocephalic, atraumatic.  Neck: Supple without bruits, JVD not elevated, but difficult to assess secondary to equipment, position Lungs:  Resp regular and unlabored, good air exchange. Heart: RRR, S1, S2, no S3, S4, or murmur; no rub. Abdomen: Soft, non-tender, non-distended, BS + x 4.  Extremities: No clubbing, cyanosis, no edema.  Neuro: Alert and awake  LABS: CBC:  Recent Labs  04/28/13 0530 04/29/13 0540  WBC 8.8 9.0  HGB 7.7* 8.0*  HCT 23.9* 24.4*  MCV 96.8 96.4  PLT 337 338   INR:  Recent Labs  04/29/13 0540  INR 1.27   Basic Metabolic Panel:  Recent Labs  14/78/29 0515 04/29/13 0540  NA 140 136  K 3.6 3.9  CL 101 101  CO2 27 30    GLUCOSE 116* 89  BUN 28* 24*  CREATININE 0.82 0.83  CALCIUM 8.8 8.3*  MG 2.0 2.0  PHOS 3.1  --    Lab Results  Component Value Date   HGBA1C 5.5 04/09/2013    TELE:  SR, multiple PVCs and pairs. 8 beat run of NSVT  Current Medications:  . amiodarone  400 mg Per Tube Daily  . antiseptic oral rinse  15 mL Mouth Rinse QID  . aspirin  81 mg Per J Tube Daily  . atorvastatin  80 mg Per Tube q1800  . carvedilol  25 mg Per Tube BID WC  . chlorhexidine  15 mL Mouth Rinse BID  . free water  200 mL Per Tube Q8H  . furosemide  40 mg Oral Daily  . heparin subcutaneous  5,000 Units Subcutaneous Q8H  . hydrALAZINE  10 mg Oral Q8H  . insulin aspart  0-15 Units Subcutaneous Q4H  . lisinopril  20 mg Per Tube Daily  . oxybutynin  5 mg Per Tube Daily  . pantoprazole sodium  40 mg Per Tube Daily  . potassium chloride  20 mEq Oral Daily   . sodium chloride 10 mL/hr (04/26/13 2215)  . feeding supplement (JEVITY 1.2 CAL) 1,000 mL (04/29/13 5621)  ASSESSMENT AND PLAN:  1. Paroxysmal Atrial fibrillation-probably related to acute PNA/respiratory failure - CHADS = 4. Coumadin stopped due to bleeding around trach and anemia. Maintaining sinus rhythm on amiodarone. Given high CHADS score would ideally favor anti-coagulation but did not tolerate here in hospital due to bleeding. Since he is maintaining NSR on amio we will just continue ASA 81 mg for now. Continue 400 mg daily for one month then 200 mg daily.  2. Acute on chronic respiratory failure. Now s/p trach. PCCM following. Per CCM, tolerating trach collar well. Dr. Redmond School was following as OP, will manage trach. Wife receiving training for trach care if he is to go home - this is scheduled for today. I informed her he could not eat given risk of aspiration.   Oxygen levels are maintained off O2, so he does not currently qualify for home O2. Trach sutures removal today.  3. CHF - acute on chronic diastolic. Continue lasix 40 mg daily. Volume status  looks good.   4. H/o CVA's - brain stem CVA 2000. Wife has cared for him at home since then. Feeding tube to be changed in January. She has general medical equipment, will only need trach supplies.   5. CAD s/p ? Inferior STEMI on admit. Cath with 95% lesion in nondominant RCA. Left system OK. Treated medically with b-blocker, asa, statin.   6. HTN - SBP 130s-160s on current medical therapy. Coreg increased to 25 mg bid. BP still elevated. Will add hydralazine 10 mg tid.  7. Pulmonary nodule consistent with Lung CA. Not a candidate for bx or surgical Rx.    Plan: Working toward planned DC tomorrow. Prognosis is poor.  Principal Problem:   ST elevation myocardial infarction (STEMI) of inferolateral wall Active Problems:   CAD (coronary artery disease)   History of CVA (cerebrovascular accident)   History of TIA (transient ischemic attack)   VT (ventricular tachycardia)   Atrial fibrillation with RVR   Pulmonary mass   Aspiration pneumonia   Acute-on-chronic respiratory failure   Anemia   Tracheostomy status     [name] 04/29/2013 11:47 AM

## 2013-04-29 NOTE — Progress Notes (Signed)
Dr. [place] came to this nurse and requested that oxygen be turned off as is should not have it on.  To be discharged tomorrow to home with wife.  Oxygen has been turned off.

## 2013-04-30 ENCOUNTER — Encounter (HOSPITAL_COMMUNITY): Payer: Self-pay | Admitting: Physician Assistant

## 2013-04-30 DIAGNOSIS — I5033 Acute on chronic diastolic (congestive) heart failure: Secondary | ICD-10-CM | POA: Diagnosis present

## 2013-04-30 DIAGNOSIS — Q549 Hypospadias, unspecified: Secondary | ICD-10-CM

## 2013-04-30 DIAGNOSIS — I480 Paroxysmal atrial fibrillation: Secondary | ICD-10-CM | POA: Diagnosis present

## 2013-04-30 DIAGNOSIS — E870 Hyperosmolality and hypernatremia: Secondary | ICD-10-CM | POA: Diagnosis present

## 2013-04-30 DIAGNOSIS — I10 Essential (primary) hypertension: Secondary | ICD-10-CM | POA: Insufficient documentation

## 2013-04-30 DIAGNOSIS — IMO0002 Reserved for concepts with insufficient information to code with codable children: Secondary | ICD-10-CM

## 2013-04-30 DIAGNOSIS — I2510 Atherosclerotic heart disease of native coronary artery without angina pectoris: Secondary | ICD-10-CM | POA: Diagnosis present

## 2013-04-30 DIAGNOSIS — D491 Neoplasm of unspecified behavior of respiratory system: Secondary | ICD-10-CM | POA: Insufficient documentation

## 2013-04-30 DIAGNOSIS — N179 Acute kidney failure, unspecified: Secondary | ICD-10-CM | POA: Diagnosis present

## 2013-04-30 DIAGNOSIS — E876 Hypokalemia: Secondary | ICD-10-CM | POA: Diagnosis present

## 2013-04-30 DIAGNOSIS — I351 Nonrheumatic aortic (valve) insufficiency: Secondary | ICD-10-CM | POA: Diagnosis present

## 2013-04-30 DIAGNOSIS — I959 Hypotension, unspecified: Secondary | ICD-10-CM | POA: Diagnosis present

## 2013-04-30 LAB — CBC
HCT: 25.8 % — ABNORMAL LOW (ref 39.0–52.0)
MCHC: 32.9 g/dL (ref 30.0–36.0)
MCV: 96.3 fL (ref 78.0–100.0)
Platelets: 346 10*3/uL (ref 150–400)
RBC: 2.68 MIL/uL — ABNORMAL LOW (ref 4.22–5.81)
RDW: 15.9 % — ABNORMAL HIGH (ref 11.5–15.5)
WBC: 9.8 10*3/uL (ref 4.0–10.5)

## 2013-04-30 LAB — PROTIME-INR
INR: 1.14 (ref 0.00–1.49)
Prothrombin Time: 14.4 seconds (ref 11.6–15.2)

## 2013-04-30 LAB — GLUCOSE, CAPILLARY: Glucose-Capillary: 109 mg/dL — ABNORMAL HIGH (ref 70–99)

## 2013-04-30 MED ORDER — PANTOPRAZOLE SODIUM 40 MG PO PACK: 40.0000 mg | PACK | Freq: Every day | ORAL | Status: DC

## 2013-04-30 MED ORDER — LORAZEPAM 2 MG/ML IJ SOLN
1.0000 mg | Freq: Once | INTRAMUSCULAR | Status: AC
  Administered 2013-04-30: 1 mg via INTRAVENOUS
  Filled 2013-04-30: qty 1

## 2013-04-30 MED ORDER — FUROSEMIDE 40 MG PO TABS: 40.0000 mg | ORAL_TABLET | Freq: Every day | ORAL | Status: DC

## 2013-04-30 MED ORDER — LISINOPRIL 20 MG PO TABS: 20.0000 mg | ORAL_TABLET | Freq: Every day | ORAL | Status: DC

## 2013-04-30 MED ORDER — SIMVASTATIN 40 MG PO TABS: 20.0000 mg | ORAL_TABLET | Freq: Every day | ORAL | Status: AC

## 2013-04-30 MED ORDER — ATORVASTATIN CALCIUM 80 MG PO TABS: 80.0000 mg | ORAL_TABLET | Freq: Every evening | ORAL | Status: DC

## 2013-04-30 MED ORDER — HYDRALAZINE HCL 25 MG PO TABS: 25.0000 mg | ORAL_TABLET | Freq: Three times a day (TID) | ORAL | Status: AC

## 2013-04-30 MED ORDER — HYDRALAZINE HCL 25 MG PO TABS
25.0000 mg | ORAL_TABLET | Freq: Three times a day (TID) | ORAL | Status: DC
  Administered 2013-04-30: 25 mg via ORAL
  Filled 2013-04-30 (×3): qty 1

## 2013-04-30 MED ORDER — POTASSIUM CHLORIDE 20 MEQ/15ML (10%) PO LIQD: 20.0000 meq | Freq: Every day | ORAL | Status: DC

## 2013-04-30 MED ORDER — ASPIRIN 81 MG PO TABS: 81.0000 mg | ORAL_TABLET | Freq: Every day | ORAL | Status: AC

## 2013-04-30 MED ORDER — ASPIRIN 81 MG PO TABS: 81.0000 mg | ORAL_TABLET | Freq: Every day | ORAL | Status: DC

## 2013-04-30 MED ORDER — HYDRALAZINE HCL 25 MG PO TABS: 25.0000 mg | ORAL_TABLET | Freq: Three times a day (TID) | ORAL | Status: DC

## 2013-04-30 MED ORDER — AMIODARONE HCL 400 MG PO TABS: ORAL_TABLET | ORAL | Status: DC

## 2013-04-30 MED ORDER — AMIODARONE HCL 200 MG PO TABS: ORAL_TABLET | ORAL | Status: DC

## 2013-04-30 MED ORDER — CARVEDILOL 25 MG PO TABS: 25.0000 mg | ORAL_TABLET | Freq: Two times a day (BID) | ORAL | Status: DC

## 2013-04-30 MED ORDER — CARVEDILOL 25 MG PO TABS: 25.0000 mg | ORAL_TABLET | Freq: Two times a day (BID) | ORAL | Status: AC

## 2013-04-30 NOTE — Progress Notes (Signed)
Patient Name: [name] [name] Date of Encounter: 04/30/2013  Principal Problem:   ST elevation myocardial infarction (STEMI) of inferolateral wall Active Problems:   CAD (coronary artery disease)   History of CVA (cerebrovascular accident)   History of TIA (transient ischemic attack)   VT (ventricular tachycardia)   Atrial fibrillation with RVR   Pulmonary mass   Aspiration pneumonia   Acute-on-chronic respiratory failure   Anemia   Tracheostomy status    SUBJECTIVE: Pt nods to questions, denies chest pain or SOB. He is agitated at times and combative with staff.  OBJECTIVE Filed Vitals:   04/30/13 0319 04/30/13 0529 04/30/13 0821 04/30/13 0831  BP:  185/99  181/90  Pulse: 60 62 65 67  Temp:  98.9 F (37.2 C)    TempSrc:  Oral    Resp: 18 18 18    Height:      Weight:  160 lb 12.8 oz (72.938 kg)    SpO2: 96% 99% 98%     Intake/Output Summary (Last 24 hours) at 04/30/13 0835 Last data filed at 04/30/13 0543  Gross per 24 hour  Intake   2290 ml  Output   2510 ml  Net   -220 ml   Filed Weights   04/28/13 0444 04/29/13 0359 04/30/13 0529  Weight: 167 lb 12.3 oz (76.1 kg) 151 lb 14.4 oz (68.9 kg) 160 lb 12.8 oz (72.938 kg)    PHYSICAL EXAM General: Well developed, well nourished, male in no acute distress. Head: Normocephalic, atraumatic.  Neck: Supple without bruits, JVD not elevated, but difficult to assess secondary to equipment, position Lungs:  Resp regular and unlabored, good air exchange. Heart: RRR, S1, S2, no S3, S4, or murmur; no rub. Abdomen: Soft, non-tender, non-distended, BS + x 4.  Extremities: No clubbing, cyanosis, no edema.  Neuro: Alert and awake  LABS: CBC:  Recent Labs  04/29/13 0540 04/30/13 0525  WBC 9.0 9.8  HGB 8.0* 8.5*  HCT 24.4* 25.8*  MCV 96.4 96.3  PLT 338 346   INR:  Recent Labs  04/30/13 0525  INR 1.14   Basic Metabolic Panel:  Recent Labs  16/10/96 0540  NA 136  K 3.9  CL 101  CO2 30  GLUCOSE 89    BUN 24*  CREATININE 0.83  CALCIUM 8.3*  MG 2.0   Lab Results  Component Value Date   HGBA1C 5.5 04/09/2013    TELE:  SR, multiple PVCs and pairs. 6 beat run of NSVT  Current Medications:  . amiodarone  400 mg Per Tube Daily  . antiseptic oral rinse  15 mL Mouth Rinse QID  . aspirin  81 mg Per J Tube Daily  . atorvastatin  80 mg Per Tube q1800  . carvedilol  25 mg Per Tube BID WC  . chlorhexidine  15 mL Mouth Rinse BID  . free water  200 mL Per Tube Q8H  . furosemide  40 mg Oral Daily  . heparin subcutaneous  5,000 Units Subcutaneous Q8H  . hydrALAZINE  25 mg Oral Q8H  . insulin aspart  0-15 Units Subcutaneous Q4H  . lisinopril  20 mg Per Tube Daily  . oxybutynin  5 mg Per Tube Daily  . pantoprazole sodium  40 mg Per Tube Daily  . potassium chloride  20 mEq Oral Daily   . sodium chloride 10 mL/hr (04/26/13 2215)  . feeding supplement (JEVITY 1.2 CAL) 1,000 mL (04/29/13 2007)    ASSESSMENT AND PLAN:  1. Paroxysmal Atrial fibrillation-probably  related to acute PNA/respiratory failure - CHADS = 4. Coumadin stopped due to bleeding around trach and anemia. Maintaining sinus rhythm on amiodarone. Given high CHADS score would ideally favor anti-coagulation but did not tolerate here in hospital due to bleeding. Since he is maintaining NSR on amio we will just continue ASA 81 mg for now. Continue 400 mg daily for one month then 200 mg daily.  2. Acute on chronic respiratory failure. Now s/p trach. PCCM following. Per CCM, tolerating trach collar well. Dr. Redmond School was following as OP, will manage trach. Wife received training for trach care. I informed her he could not eat given risk of aspiration.   Oxygen levels are maintained off O2, so he does not currently qualify for home O2.   3. CHF - acute on chronic diastolic. Continue lasix 40 mg daily. Volume status looks good.   4. H/o CVA's - brain stem CVA 2000. Wife has cared for him at home since then. Feeding tube to be changed in  January. She has general medical equipment, will only need trach supplies.   5. CAD s/p ? Inferior STEMI on admit. Cath with 95% lesion in nondominant RCA. Left system OK. Treated medically with b-blocker, asa, statin.   6. HTN - BP elevated with agitation. Will increase hydralazine to 25 mg tid.  7. Pulmonary nodule consistent with Lung CA. Not a candidate for bx or surgical Rx.    Plan: Will DC foley, rectal tube, and PICC line. Patient stable for DC today. With lung CA prognosis is poor. Will have follow up care at home as much as possible under Dr. Pilar Grammes care. If specialty care is needed can arrange as Dr. Redmond School sees fit.   Principal Problem:   ST elevation myocardial infarction (STEMI) of inferolateral wall Active Problems:   CAD (coronary artery disease)   History of CVA (cerebrovascular accident)   History of TIA (transient ischemic attack)   VT (ventricular tachycardia)   Atrial fibrillation with RVR   Pulmonary mass   Aspiration pneumonia   Acute-on-chronic respiratory failure   Anemia   Tracheostomy status     Theron Arista [facility] 04/30/2013 8:35 AM

## 2013-04-30 NOTE — Progress Notes (Signed)
PULMONARY  / CRITICAL CARE MEDICINE  Name: [name] [name] MRN: [id number] DOB: 15-May-1941    ADMISSION DATE:  04/09/2013 CONSULTATION DATE:  04/09/2013  REFERRING MD :  Cardiology PRIMARY SERVICE: PCCM  CHIEF COMPLAINT:  Acute respiratory failure  BRIEF PATIENT DESCRIPTION: 72 y/o M with history of multiple CVA (aphasia, PEG) admitted with respiratory failure in setting of pneumonia and STEMI.  SIGNIFICANT EVENTS / STUDIES:  11/21 - Admitted with respiratory failure in setting of pneumonia and STEMI 11/21 - Cardiac cath >>> 95% occluded RCA, no intervention due to non dominant nature 11/22 - TTE >>> EF 45-50%, abnormal relaxation, mild aortic regurgitation 11/29 - Remains on vent, pending trach on 12/1 per ENT 12/01 - Tracheostomy by ENT completed 12/03 - Bleeding around trach site noted 12/10 - remains in no distress on TC  LINES / TUBES: Foley 11/21 >>> PEG ??? >>> R fem CVL 11/21 >>> out ETT 11/21>>>12/1 Trach (shoemaker) 12/1>>> R fem A-Line 11/21 >>> out R PICC 12/1 >>>  CULTURES: 11/21 Blood >>> Neg 11/22 Sputum >>> WBCs, no organisms  ANTIBIOTICS: Azithromycin 11/21 >>>11/28 Ceftriaxone 11/21>>>11/30  SUBJECTIVE:  Planned d/c 12/12 per Cardiology   VITAL SIGNS: Temp:  [98.3 F (36.8 C)-99 F (37.2 C)] 98.9 F (37.2 C) (12/12 0529) Pulse Rate:  [58-68] 67 (12/12 0831) Resp:  [17-20] 18 (12/12 0821) BP: (144-185)/(76-99) 181/90 mmHg (12/12 0831) SpO2:  [79 %-100 %] 98 % (12/12 0821) FiO2 (%):  [21 %-60 %] 21 % (12/12 0821) Weight:  [160 lb 12.8 oz (72.938 kg)] 160 lb 12.8 oz (72.938 kg) (12/12 0529)  VENTILATOR SETTINGS: Vent Mode:  [-]  FiO2 (%):  [21 %-60 %] 21 %  INTAKE / OUTPUT: Intake/Output     12/11 0701 - 12/12 0700 12/12 0701 - 12/13 0700   I.V. (mL/kg) 360 (4.9)    Other     NG/GT 1930    Total Intake(mL/kg) 2290 (31.4)    Urine (mL/kg/hr) 2500 (1.4)    Stool 10 (0)    Total Output 2510     Net -220 0         PHYSICAL  EXAMINATION: General:  Wd male nad on tc Neuro: awake and alert HEENT:  Trach site clean,no nasal discharge Cardiovascular:  Mild tachy, no murmurs Lungs:  resps even non labored on ATC, diminished bases  Abdomen:  Soft, PEG site intact, bowel sounds present Musculoskeletal:  Contractures, bilateral foot drop with boots on   LABS:  CBC  Recent Labs Lab 04/28/13 0530 04/29/13 0540 04/30/13 0525  WBC 8.8 9.0 9.8  HGB 7.7* 8.0* 8.5*  HCT 23.9* 24.4* 25.8*  PLT 337 338 346   Coag's  Recent Labs Lab 04/28/13 0530 04/29/13 0540 04/30/13 0525  INR 1.21 1.27 1.14   BMET  Recent Labs Lab 04/24/13 0500 04/27/13 0515 04/29/13 0540  NA 143 140 136  K 4.3 3.6 3.9  CL 104 101 101  CO2 32 27 30  BUN 32* 28* 24*  CREATININE 0.87 0.82 0.83  GLUCOSE 131* 116* 89   Electrolytes  Recent Labs Lab 04/24/13 0500 04/27/13 0515 04/29/13 0540  CALCIUM 8.6 8.8 8.3*  MG  --  2.0 2.0  PHOS  --  3.1  --    Glucose  Recent Labs Lab 04/29/13 0759 04/29/13 1146 04/29/13 1725 04/29/13 1945 04/30/13 0017 04/30/13 0502  GLUCAP 111* 123* 112* 124* 105* 109*    ASSESSMENT / PLAN:  PULMONARY A:  Acute respiratory failure in setting of pneumonia (CAP  vs aspiration) s/p trach  Pulmonary edema in setting of VT. Pulmonary nodule RUL - CT chest confirms, also shows LUL nodule & mediastinal LN. Bleeding from trach site - resolved 12/5  P:   - Cont trach collar as tolerated  - ENT following. Consider dc sutures, rec f/u with ENT for trach change in one month post discharge  - RT working on trach teaching  - will need trach supplies, suction, home RN, RT at discharge  - Pt is combative with attempts to assess trach site.  He has a cuffed trach in place.  Ok to d/c with cuffed trach.  Educated wife regarding need of cuff to be down at all times.   - Spoke to wife Claris Che - explained to her that this is likely lung malignancy, not a candidate for further biopsy procedures or  treatment, not sure that he can lie for RT simulation  CARDIOVASCULAR A:  VT resolved with defibrillation.  STEMI s/p cardiac cath without intervention.  H/o CAD, HTN, AFib. 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 Atrial Fibrillation  P:  - Cardiology primary, appreciate input. - no anticoagulation due to bleeding/anemia  - Cont lasix  - B blocker, asa, statin   HEMATOLOGIC  Anemia  Trach bleeding - resolved.  P:  -no anticoagulation  -f/u cbc    INFECTIOUS A:   Pneumonia (aspiration vs CAP), PCT 92 Mild Trending Up Leukocytosis - trending down 12/3  P:   - Monitor fever curve and WBCs, remains stable - No abx at this time.  Pt stable for d/c home from [facility] standpoint.  Updated wife  Wife needs trach teaching.  ENT and PCP (Tripp) to manage trach after d/c. CM working on home equipment.  Anticipate d/c 12/12. 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 Although RUL nodule can be biopsied under CT guidance, doubt we need to pursue this since we cannot offer him treatment. May need palliative care eventually if this gets worse. Patient cannot follow up in office hence will defer FU to PCP.  Pt see's NP Levora Dredge. Her supervising physcian Dr. Maryruth Hancock [facility], NP-C [facility] Pgr: [phone] or [phone]  Independently examined pt, evaluated data & formulated above care plan with NP who scribed this note & edited by me.  Victoriano Campion V.

## 2013-04-30 NOTE — Discharge Summary (Signed)
Discharge Summary   Patient ID: [name] [name] MRN: [id number], DOB/AGE: 09-27-1940 72 y.o. Admit date: 04/09/2013 D/C date:     04/30/2013  Primary Care Provider: No primary provider on file. Primary Cardiologist: [place]  Primary Discharge Diagnoses:  1. Acute inferior STEMI/CAD - 95% nondominant RCA, managed medically 2. Acute on chronic respiratory failure - required trach placement 04/19/13 (#6 Shiley cuffed tracheostomy tube) 3. Pneumonia - CAP versus aspiration pneumonia 4. 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 Paroxysmal atrial fibrillation - not a candidate for anticoagulation beyond aspirin at this point due to bleeding from trach 6. Paroxysmal VT - sustained VT on admission s/p shock - NSVT during remainder of admission 7. 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 AKI with oliguria, resolved 9. HTN 10. Intermittent hypotension 11. Pulmonary neoplasm suspicious for lung cancer - not a candidate for further biopsy or treatment at this time 12. Chronic acquired hypospadias 13. Mild AI 14. Hypernatremia 15. Hypokalemia 16. Anemia  Secondary Discharge Diagnoses:  1. History of TIA 2013 x 3 around time of pneumonia  Hospital Course: [name] [name] is a 72 y/o M with history of multiple CVA/TIAs (residual aphasia, chronic torticollis-like neck alignment and PEG placement), pulmonary mass and HTN who presented to [facility] 04/09/2013 with inferior STEMI, rapid atrial fibrillation, acute respiratory failure requiring intubation, and VT. The patient had no prior known history of CAD but his wife did report irregular heartbeat in the past as well as enlarged heart. He had not previously been on any blood thinners. She also reported he had a pulmonary mass being followed by PCP with serial x-rays. The evening prior to  admission, he developed worsening dyspnea but denied CP. He received his aspirin and atenolol and was able to rest. However, symptoms recurred the morning of admission - she gave him orange juice but he began coughing and had worsening dyspnea. EMS was called who found the patient to be in respiratory distress and hypotensive at 71/54. He was in rapid atrial fibrillation with ST elevation in II, avF, V2-V6. Code STEMI was called. He also began to have NSVT thus was placed on amio gtt without bolus by EMS. In the ER, breathing became agonal so he was intubated. He developed a run of sustained VT and received shock x 1. When back in NSR, BP began to spike. He was given ASA 324mg  ASA via J tube and underwent emergent cardiac cath which demonstrated critical stenosis in a nondominant RCA and no significant disease in the left system. Dr. [place] recommended medical management of his CAD. He did not think the RCA lesion was the cause of his acute decompensation. Plavix was initially added to his regimen. CXR was suggestive of multi-lobar pneumonia and he was placed on rocephin and azithromycin for CAP. Strep pneumo urinary antigen was positive. 2D echo 04/10/13 showed EF 45-50% with possible akinesis of the distalanteroseptal and apical myocardium, grade 2 diastolic dysfunction, mild AI.  During the early part of his admission, he had intermittent hypotension requiring normal saline bolus and caution with readdition of antihypertensives. He spontaneously converted to NSR on amiodarone drip. On 04/12/13, this was transitioned to oral form. He remained on IV heparin post-cath for his MI and afib pending clinical improvement. He developed transient AKI with oliguria which improved. With regard to his respiratory failure, there was also felt to  be a component of acute on chronic diastolic CHF and thus he was diuresed with IV Lasix. PICC was placed for continued antibiotic/IV needs given limited access. Unfortunately, the  patient was unable to be weaned from the vent. He underwent tracheostomy placement 04/19/13 (#6 Shiley cuffed tracheostomy tube). Coumadin was started post-procedure and Plavix was discontinued. By that day, his Lasix was also able to be changed to oral form but required uptitration several days later. He had short bursts of NSVT - his potassium was managed and he was continued on beta blocker. He developed hypernatremia and free water was increased in the setting of increased Lasix dosing.  While on anticoagulation, the patient developed a ping-pong sized hemoptysis clot and had developed bleeding around the trach and into the vent circuit. ENT was reconsulted and he was managed conservatively. They did not have to take him back to the OR. Although with high CHADSVasc score would ideally favor anticoagulation, ultimately it was decided that [name] [name] was not a candidate for anticoagulation other than aspirin at this time. His bleeding resolved. During the latter part of his hospitalization, his disposition was established. PT recommended 24-hour supervision at home which the patient's wife expressed ability to provide. She was also educated on trach care. Care management assisted with arrangement of DME and home health. PICC was discontinued on day of discharge.  The patient also had a CT chest to evaluate a nodule seen on CXR. This demonstrated a 2cm RUL nodule concerning for lung neoplasm, as well as nonspecific 1.5cm LUL nodule and 9mm LUL nodule. The pulmonary team felt this likely represented lung malignancy but the patient was not felt to be a candidate for further biopsy procedures or treatment. He may eventually need palliative care if his condition worsens. They discussed this with his wife as well. His overall prognosis is poor. The patient follows regularly with NP Levora Dredge at Dr. Pilar Grammes office, and his office has agreed to manage the trach along with ENT after discharge. His final suture will  be clipped per pulm prior to DC. Dr. [place] does not feel specific cardiac followup is needed at this time since the patient is being managed conservatively, but we will be available in the future if specialty care is necessary. Dr. [place] has seen and examined the patient today and feels he is stable for discharge. He recommends continuing ASA 81mg  daily for now, amiodarone 400mg  x 1 month then decreasing to 200mg  daily. Due to potential for interaction with statin, his statin dose was slightly reduced. The patient's wife requested that prescriptions be sent to [facility]. His wife was extremely concerned with cost so we tried to keep this in mind when finalizing his med reconciliation. Pharmacy will supply the patient with a 7-day supply of the Protonix and amiodarone to cover them until the mail order rx is expected to arrive. I printed out a 30-day rx for the remaining meds which are on the $4 list and then sent in an electronic prescription for all refills to [facility].   Other issues this admission:  - Urology was consulted 04/12/13 for penile erosion and felt he had chronic acquired hypospadius without evidence for infection. Dr. Annabell Howells recommended to maintain the foley with a tube holder to avoid further pressure necrosis and trial without catheter. The patient should follow up PCP for consideration of possible eventual suprapubic catheter if needed.  - The wife was informed the patient is no longer allowed to eat due to aspiration risk. He will  continue getting nutrition through feeding tube as prior to admission. - Pulmonary recommended to followup ENT in 1 month post-discharge for trache change, felt OK to DC with cuffed trache, and educated wife regarding need of cuff to be down at all times.  - The patient is anemic down to Hgb 8.5 which may be combination of bleeding and anemia of critical disease. This has stabilized. This should be monitored as PCP sees fit. - The patient received intermittent  SSI this admission. Hemoglobin A1C was 5.5 thus will not be prescribing this at discharge. - Home health RN, RT, SLP ordered at discharge. CM has spoken with the patient's NP Levora Dredge who requests home health nurse contact her @ [phone] for issues M-F, leave message @ [phone] after hours and weekends. This info has been conveyed to [facility] Liaison. A copy of this summary was faxed to PCP on day of discharge as requested. - Consider OP f/u testing given amiodarone/statin initiation to include thyroid function testing and LFTs. PFTs will likely not be helpful given lung mass.  Discharge Vitals: Blood pressure 181/90 (hydralazine increased prior to discharge), pulse 67, temperature 98.9 F (37.2 C), temperature source Oral, resp. rate 18, height 5\' 6"  (1.676 m), weight 160 lb 12.8 oz (72.938 kg), SpO2 98.00%.  Labs: Lab Results  Component Value Date   WBC 9.8 04/30/2013   HGB 8.5* 04/30/2013   HCT 25.8* 04/30/2013   MCV 96.3 04/30/2013   PLT 346 04/30/2013     Recent Labs Lab 04/29/13 0540  NA 136  K 3.9  CL 101  CO2 30  BUN 24*  CREATININE 0.83  CALCIUM 8.3*  GLUCOSE 89     Ref. Range 04/09/2013 07:55 04/09/2013 15:17  CK, MB Latest Range: 0.3-4.0 ng/mL 2.4   CK Total Latest Range: 7-232 U/L 57   Troponin I Latest Range: <0.30 ng/mL <0.30 0.63 [facility])   Lab Results  Component Value Date   CHOL 68 04/10/2013   HDL 41 04/10/2013   LDLCALC 16 04/10/2013   TRIG 56 04/10/2013     Diagnostic Studies/Procedures   Cardiac Cath 04/09/13 Cardiac Catheterization Procedure Note  Name: Sigmond Patalano  MRN: [id number]  DOB: November 27, 1940  Procedure: Left Heart Cath, Selective Coronary Angiography, LV angiography  Indication: 72 year old black male with history of multiple CVAs and chronic aphasia. Patient presents with acute respiratory failure associated with atrial fibrillation a rapid ventricular response. ECG showed diffuse ST elevation more pronounced in the inferior lateral leads.  In the emergency department the patient decompensated and required intubation. He developed sustained ventricular tachycardia requiring cardioversion. ECG showed persistent ST elevation as noted. Family wanted aggressive management. There was delay in bringing the patient to the cath lab because of his hemodynamic instability and needs for management of his respiratory failure in the emergency department.  Procedural details: The right groin was prepped, draped, and anesthetized with 1% lidocaine. Using modified Seldinger technique, a 6 [place] sheath was introduced into the right femoral artery and a separate 6 French sheath was placed in the right femoral vein for IV access. Standard Judkins catheters were used for coronary angiography. Catheter exchanges were performed over a guidewire. There were no immediate procedural complications. The patient was transferred to the post catheterization recovery area for further monitoring.  Procedural Findings:  Hemodynamics:  AO 190/104 with a mean of 133 mmHg  LV 189/27 mmHg  Coronary angiography:  Coronary dominance: Left  Left mainstem: Normal.  Left anterior descending (LAD): Minor irregularities less than  10%.  There is a large ramus intermediate branch which is normal.  Left circumflex (LCx): This is a large dominant vessel. There is diffuse 20% narrowing in the proximal vessel. The second marginal branch has a 40% stenosis proximally.  Right coronary artery (RCA): The right coronary is a nondominant vessel. It gives rise to a single right ventricular marginal branch. In the mid vessel there is a 95% stenosis that is focal. There is TIMI grade 3 flow.  Left ventriculography: Not performed  Final Conclusions:  1. Single vessel obstructive coronary disease. Patient does have a critical stenosis in a nondominant right coronary. The large dominant left coronary system is without significant disease.  Recommendations: I recommend medical management of  his coronary disease. I do not think that the right coronary lesion is the cause of his acute decompensation. Chest x-ray indicates a significant right lung pneumonia. It is possible that his ST changes may be related to pericarditis in association with his pneumonia. We will manage him with IV heparin and nitroglycerin. We'll continue aspirin and beta blocker therapy. We will add Plavix. Critical care medicine was consult for management of his pneumonia and respiratory failure. Patient is critically ill and prognosis is poor.  We will obtain an echocardiogram to assess his LV function.  2D Echo EF 04/10/13 - Left ventricle: The cavity size was normal. Systolic function was mildly reduced. The estimated ejection fraction was in the range of 45% to 50%. Possible akinesis of the distalanteroseptal and apical myocardium. Features are consistent with a pseudonormal left ventricular filling pattern, with concomitant abnormal relaxation and increased filling pressure (grade 2 diastolic dysfunction). - Aortic valve: Mild regurgitation.  Tracheostomy placement - 04/19/13  Ct Chest Wo Contrast 04/29/2013   CLINICAL DATA:  Evaluate pulmonary nodule  EXAM: CT CHEST WITHOUT CONTRAST  TECHNIQUE: Multidetector CT imaging of the chest was performed following the standard protocol without IV contrast.  COMPARISON:  Chest radiograph from 12/3/ 14  FINDINGS: Small left pleural effusion identified. There is a 2 cm pulmonary nodule within the right upper lobe, image 22/series 3. In the left upper lobe there is a nodule measuring 9 mm. There is a adjacent ground-glass attenuating nodule measuring 1.5 cm, image 20/series 3. Atelectasis is identified in both lower lobes. There is mild airspace consolidation in the posterior left lung base.  Moderate cardiac enlargement. There is calcified atherosclerotic change affecting the thoracic aorta as well as the LAD and left circumflex coronary artery. No pericardial effusion.  Right paratracheal lymph node measures 1 cm. There is a pre-vascular lymph node which measures 1.3 cm.  No axillary or supraclavicular adenopathy. Patient has a tracheostomy tube.  Incidental imaging through the upper abdomen shows multiple renal cysts. Stones are identified within the gallbladder. Scattered low attenuation structures within the liver are indeterminate but likely represent small cysts.  Review of the visualized osseous structures is significant for a scoliosis deformity involving the thoracic and lumbar spine  IMPRESSION: 1. Pulmonary nodule in the right upper lobe measures 2 cm and is concerning for a lung neoplasm. Recommend further assessment with PET-CT and possible tissue sampling. 2. 1.5 cm ground-glass attenuating nodule in the left upper lobe is nonspecific. A followup examination and 3 months would be advised to confirm persistence. If this persists then annual surveillance for minimum of 3 years would be advised. 3. 9 mm left upper lobe solid nodule. 4. Calcified stress atherosclerotic disease including multi vessel coronary artery calcifications 5. Renal cysts 6. Gallstones   Electronically  Signed   By: Signa Kell M.D.   On: 04/29/2013 08:22   Dg Chest Port 1 View 04/22/2013   CLINICAL DATA:  Bleeding around trach site.  EXAM: PORTABLE CHEST - 1 VIEW  COMPARISON:  04/21/2013 at 1228 hr.  FINDINGS: 2355 hr. Right-sided PICC line that terminates at the low SVC or high right atrium. Numerous leads and wires project over the chest. Tracheostomy remains appropriately positioned. Mild cardiomegaly. No pleural effusion or pneumothorax. Mild interstitial edema which is asymmetric, and greater on the left than right. Improved left base aeration with mild airspace disease remaining. Right upper lobe nodular density is partially obscured by overlying wire.  IMPRESSION: Appropriate position of tracheostomy, without explanation for bleeding.  Persistent asymmetric interstitial edema with  improved left base airspace disease.  Right upper lobe nodular density, slightly less distinct than on the exam of earlier today. Recommend attention on followup.   Electronically Signed   By: Jeronimo Greaves M.D.   On: 04/22/2013 00:47   Dg Chest Portable 1 View - adm 04/09/2013   CLINICAL DATA:  72 year old male with shortness of Breath status post intubation. Initial encounter.  EXAM: PORTABLE CHEST - 1 VIEW  COMPARISON:  None.  FINDINGS: Portable AP supine view at 0727 hrs. Intubated. Endotracheal tube tip in good position just below the clavicles. Enteric tube courses to the abdomen, tip not included.  Scoliosis. There is cardiomegaly. Other mediastinal contours are within normal limits.  Nodular and confluent opacity throughout the right lung, and also suspected at the left lung base. Left lung base partially obscured by pacer/resuscitation pads. No pneumothorax or definite effusion.  IMPRESSION: 1. Endotracheal tube in good position. Enteric tube courses to the abdomen, tip not included.  2. Multilobar right lung airspace disease. Involvement also suspected at the left lung base. Top considerations are large volume aspiration versus multilobar pneumonia.   Electronically Signed   By: Augusto Gamble M.D.   On: 04/09/2013 07:40    Discharge Medications     Medication List    STOP taking these medications       atenolol 50 MG tablet  Commonly known as:  TENORMIN     diazepam 5 MG tablet  Commonly known as:  VALIUM     hydrochlorothiazide 25 MG tablet  Commonly known as:  HYDRODIURIL     ramipril 10 MG capsule  Commonly known as:  ALTACE      TAKE these medications       amiodarone 200 MG tablet  Commonly known as:  PACERONE  Take 2 tablets by feeding tube daily until 05/31/13, then decrease to 1 tablet daily.     aspirin 81 MG tablet  Place 1 tablet (81 mg total) into feeding tube daily.     carvedilol 25 MG tablet  Commonly known as:  COREG  Place 1 tablet (25 mg total) into feeding  tube 2 (two) times daily.     Cholecalciferol 1000 UNITS capsule  Place 1,000 Units into feeding tube daily.     furosemide 40 MG tablet  Commonly known as:  LASIX  Place 1 tablet (40 mg total) into feeding tube daily.     hydrALAZINE 25 MG tablet  Commonly known as:  APRESOLINE  Place 1 tablet (25 mg total) into feeding tube every 8 (eight) hours.     lisinopril 20 MG tablet  Commonly known as:  PRINIVIL,ZESTRIL  Place 1 tablet (20 mg total) into feeding tube daily.     multivitamin with  minerals Tabs tablet  Place 1 tablet into feeding tube daily.     oxybutynin 5 MG tablet  Commonly known as:  DITROPAN  Place 5 mg into feeding tube daily.     pantoprazole sodium 40 mg/20 mL Pack  Commonly known as:  PROTONIX  Place 20 mLs (40 mg total) into feeding tube daily.     potassium chloride 10 MEQ tablet  Commonly known as:  K-DUR  Place 20 mEq into feeding tube daily.     simvastatin 40 MG tablet  Commonly known as:  ZOCOR  Place 0.5 tablets (20 mg total) into feeding tube daily.        Disposition   The patient will be discharged in stable condition to home. Discharge Orders   Future Orders Complete By Expires   Discharge instructions  As directed    Comments:     The patient is not allowed to eat by mouth. Continue nutrition through feeding tube. Continue tracheostomy care.   Increase activity slowly  As directed    Comments:     No driving.     Follow-up Information   Follow up with Peter [place], MD. (As needed)    Specialty:  Cardiology   Contact information:   [address] [place] [address] [phone]       Follow up with [facility]. (As needed)    Specialty:  Pulmonology   Contact information:   [address] [place] [address] [phone]      Follow up with Florentina Jenny, MD. (Call Dr. Pilar Grammes office to schedule an appointment with Levora Dredge and to make a plan to follow the tracheostomy.)    Specialty:   Family Medicine   Contact information:   [address] [place] [address] [phone]       Follow up with Melvenia Beam, MD. (Ear/Nose/Throat Doctor - 05/31/13 at 1pm - arrive at 12:45pm - bring your trach equipment, copay, insurance card, and list of current medications.)    Specialty:  Otolaryngology   Contact information:   [address] [place] [address] [phone]         Duration of Discharge Encounter: Greater than 30 minutes including physician and PA time.  Signed, Tempest Frankland PA-C 04/30/2013, 1:21 PM

## 2013-04-30 NOTE — Progress Notes (Signed)
Spoke with Dr. Annalee Genta regarding pt tracheostomy with balloon. Ok for RT or Critical care to remove and replace with cufless tracheostomy tube . CC care called and updated . New order received . Ok for pt to go home with current tracheostomy but to make sure wife does not inflate balloon.

## 2013-04-30 NOTE — Progress Notes (Signed)
Speech Language Pathology Treatment: [name] [name] Speaking valve  Patient Details Name: [name] [name] MRN: [id number] DOB: March 05, 1941 Today's Date: 04/30/2013 Time: 1035-1050 SLP Time Calculation (min): 15 min  Assessment / Plan / Recommendation Clinical Impression  Purpose of session was initiation of PMSV placement with pt.'s wife present.  Pt. Refused to attempt PMSV placement/evaluation with max encouragement from SLP and wife.  Session focused on education of PMSV, purpose etc.  Recommend pt. Receive home health ST for assessment with Passy-Muir speaking valve (plan is for d/c today home with wife). CAN SOCIAL WORK ARRANGE ST HOME HEALTH??   HPI HPI: 72 y/o M with history of multiple CVA/TIAs (residual aphasia, chronic torticollis-like neck alignment and J-tube placement), irregular heartbeat, enlarged heart, "small pulmonary mass" and HTN who presented to [facility] today with STEMI, rapid AF, acute respiratory failure, and VT.  She says he has baseline aphasia at home but is still able to communicate. She reports that last night he developed worsening SOB. He has dyspnea at times but this was worse. This morning however he complained of increased SOB. She gave him juice and he began coughing. She says he takes Jevity/meds through his g-tube but states that he also does eat at home. She called EMS who found the patient to be in respiratory distress and hypotensive at 71/54. He was in rapid atrial fibrillation with ST elevation in II, avF, V2-V6. Code STEMI was called. He also began to have NSVT thus was placed on amio gtt without bolus by EMS. In the ED, breathing became agonal so he was intubated and trach'd 12/1.   Pertinent Vitals WDL  SLP Plan   (plans for d/c today)    Recommendations                Oral Care Recommendations: Oral care BID Follow up Recommendations: Home health SLP Plan:  (plans for d/c today)    GO     [name] [name] M.Ed [employer]  [phone]  04/30/2013

## 2013-04-30 NOTE — Progress Notes (Signed)
RT got an order to remove tracheostomy sutures.  While trying to remove sutures pt was combative with RT.  The right suture was removed but the left was not due to the pt being physical.  Reported to RN.  Will continue to monitor.

## 2013-04-30 NOTE — Progress Notes (Signed)
Removed sutures per md pt tolerated well

## 2013-04-30 NOTE — Progress Notes (Signed)
Discussed at length trach care for home.  Wife says she was comfortable changing inner cannula and suctioning patient Discussed cleaning and wife assured me she was comfortable.  She stated :He has had a trach before".  I asked her for questions and she had none.Marland Kitchen

## 2013-05-03 NOTE — Discharge Summary (Signed)
Patient seen and examined and history reviewed. Agree with above findings and plan. See my earlier rounding note.  Theron Arista JordanMD 05/03/2013 8:30 AM

## 2013-05-27 ENCOUNTER — Emergency Department (HOSPITAL_COMMUNITY): Payer: Medicare Other

## 2013-05-27 ENCOUNTER — Encounter (HOSPITAL_COMMUNITY): Payer: Self-pay | Admitting: Emergency Medicine

## 2013-05-27 ENCOUNTER — Emergency Department (HOSPITAL_COMMUNITY)
Admission: EM | Admit: 2013-05-27 | Discharge: 2013-05-28 | Disposition: A | Discharge status: Home / Self Care | Payer: Medicare Other | Attending: Emergency Medicine | Admitting: Emergency Medicine

## 2013-05-27 DIAGNOSIS — M436 Torticollis: Secondary | ICD-10-CM | POA: Insufficient documentation

## 2013-05-27 DIAGNOSIS — I10 Essential (primary) hypertension: Secondary | ICD-10-CM | POA: Insufficient documentation

## 2013-05-27 DIAGNOSIS — Z7982 Long term (current) use of aspirin: Secondary | ICD-10-CM | POA: Insufficient documentation

## 2013-05-27 DIAGNOSIS — I4891 Unspecified atrial fibrillation: Secondary | ICD-10-CM | POA: Insufficient documentation

## 2013-05-27 DIAGNOSIS — Z7901 Long term (current) use of anticoagulants: Secondary | ICD-10-CM | POA: Insufficient documentation

## 2013-05-27 DIAGNOSIS — Z79899 Other long term (current) drug therapy: Secondary | ICD-10-CM | POA: Insufficient documentation

## 2013-05-27 DIAGNOSIS — Z8701 Personal history of pneumonia (recurrent): Secondary | ICD-10-CM | POA: Insufficient documentation

## 2013-05-27 DIAGNOSIS — Z87448 Personal history of other diseases of urinary system: Secondary | ICD-10-CM | POA: Insufficient documentation

## 2013-05-27 DIAGNOSIS — I2510 Atherosclerotic heart disease of native coronary artery without angina pectoris: Secondary | ICD-10-CM | POA: Insufficient documentation

## 2013-05-27 DIAGNOSIS — Z862 Personal history of diseases of the blood and blood-forming organs and certain disorders involving the immune mechanism: Secondary | ICD-10-CM | POA: Insufficient documentation

## 2013-05-27 DIAGNOSIS — Z8709 Personal history of other diseases of the respiratory system: Secondary | ICD-10-CM | POA: Insufficient documentation

## 2013-05-27 DIAGNOSIS — R0602 Shortness of breath: Secondary | ICD-10-CM | POA: Insufficient documentation

## 2013-05-27 DIAGNOSIS — I5032 Chronic diastolic (congestive) heart failure: Secondary | ICD-10-CM | POA: Insufficient documentation

## 2013-05-27 DIAGNOSIS — Z85118 Personal history of other malignant neoplasm of bronchus and lung: Secondary | ICD-10-CM | POA: Insufficient documentation

## 2013-05-27 DIAGNOSIS — Z8673 Personal history of transient ischemic attack (TIA), and cerebral infarction without residual deficits: Secondary | ICD-10-CM | POA: Insufficient documentation

## 2013-05-27 HISTORY — DX: Cerebral infarction, unspecified: I63.9

## 2013-05-27 LAB — CBC WITH DIFFERENTIAL/PLATELET
BASOS PCT: 0 % (ref 0–1)
Basophils Absolute: 0 10*3/uL (ref 0.0–0.1)
EOS ABS: 0.4 10*3/uL (ref 0.0–0.7)
EOS PCT: 3 % (ref 0–5)
HEMATOCRIT: 34.6 % — AB (ref 39.0–52.0)
Hemoglobin: 11.1 g/dL — ABNORMAL LOW (ref 13.0–17.0)
Lymphocytes Relative: 11 % — ABNORMAL LOW (ref 12–46)
Lymphs Abs: 1.2 10*3/uL (ref 0.7–4.0)
MCH: 32.2 pg (ref 26.0–34.0)
MCHC: 32.1 g/dL (ref 30.0–36.0)
MCV: 100.3 fL — AB (ref 78.0–100.0)
MONO ABS: 0.6 10*3/uL (ref 0.1–1.0)
MONOS PCT: 6 % (ref 3–12)
Neutro Abs: 8.7 10*3/uL — ABNORMAL HIGH (ref 1.7–7.7)
Neutrophils Relative %: 80 % — ABNORMAL HIGH (ref 43–77)
Platelets: 176 10*3/uL (ref 150–400)
RBC: 3.45 MIL/uL — ABNORMAL LOW (ref 4.22–5.81)
RDW: 16.8 % — ABNORMAL HIGH (ref 11.5–15.5)
WBC: 10.9 10*3/uL — ABNORMAL HIGH (ref 4.0–10.5)

## 2013-05-27 LAB — POCT I-STAT, CHEM 8
BUN: 37 mg/dL — AB (ref 6–23)
CREATININE: 1.2 mg/dL (ref 0.50–1.35)
Calcium, Ion: 1.22 mmol/L (ref 1.13–1.30)
Chloride: 104 mEq/L (ref 96–112)
GLUCOSE: 97 mg/dL (ref 70–99)
HCT: 36 % — ABNORMAL LOW (ref 39.0–52.0)
HEMOGLOBIN: 12.2 g/dL — AB (ref 13.0–17.0)
Potassium: 4 mEq/L (ref 3.7–5.3)
Sodium: 146 mEq/L (ref 137–147)
TCO2: 30 mmol/L (ref 0–100)

## 2013-05-27 LAB — PRO B NATRIURETIC PEPTIDE: Pro B Natriuretic peptide (BNP): 889.8 pg/mL — ABNORMAL HIGH (ref 0–125)

## 2013-05-27 LAB — TROPONIN I: Troponin I: <0.3 ng/mL (ref <0.30)

## 2013-05-27 NOTE — ED Provider Notes (Signed)
I saw and evaluated the patient, reviewed the resident's note and I agree with the findings and plan.  EKG Interpretation    Date/Time:  Thursday May 27 2013 21:00:16 EST Ventricular Rate:  59 PR Interval:  179 QRS Duration: 123 QT Interval:  526 QTC Calculation: 521 R Axis:   32 Text Interpretation:  Age not entered, assumed to be  73 years old for purpose of ECG interpretation Sinus rhythm Ventricular premature complex Nonspecific intraventricular conduction delay Abnormal T, consider ischemia, diffuse leads ST elevation, consider anterolateral injury No significant change since last tracing Confirmed by Barron Vanloan  MD, Jaylah Goodlow ([id number]) on 05/27/2013 9:28:32 PM            Patient seen and examined and his case was discussed with his wife. Patient appears to have mucous plug prior to arrival which has cleared with suctioning. He is at his baseline at this time. He is afebrile. Patient be discharged pending the results of his labs and chest x-ray  Leota Jacobsen, MD 05/27/13 2155

## 2013-05-27 NOTE — Discharge Instructions (Signed)
Shortness of Breath  Shortness of breath means you have trouble breathing. Shortness of breath needs medical care right away.  HOME CARE   · Do not smoke.  · Avoid being around chemicals or things (paint fumes, dust) that may bother your breathing.  · Rest as needed. Slowly begin your normal activities.  · Only take medicines as told by your doctor.  · Keep all doctor visits as told.  GET HELP RIGHT AWAY IF:   · Your shortness of breath gets worse.  · You feel lightheaded, pass out (faint), or have a cough that is not helped by medicine.  · You cough up blood.  · You have pain with breathing.  · You have pain in your chest, arms, shoulders, or belly (abdomen).  · You have a fever.  · You cannot walk up stairs or exercise the way you normally do.  · You do not get better in the time expected.  · You have a hard time doing normal activities even with rest.  · You have problems with your medicines.  · You have any new symptoms.  MAKE SURE YOU:  · Understand these instructions.  · Will watch your condition.  · Will get help right away if you are not doing well or get worse.  Document Released: 10/23/2007 Document Revised: 11/05/2011 Document Reviewed: 07/22/2011  ExitCare® Patient Information ©2014 [employer].

## 2013-05-27 NOTE — ED Provider Notes (Signed)
CSN: [id number]     Arrival date & time 05/27/13  1941 History   First MD Initiated Contact with Patient 05/27/13 2007     Chief Complaint  Patient presents with  . Shortness of Breath   HPI  [name] [name] is a 73 y/o male with history as noted below who presents with cc of shortness of breath. Symptoms began suddenly tonight. The patient was at home with his wife when he developed sudden onset of shortness of breath. The patient's wife states he had labored breathing. She attempted suction and was able to suction out some mucous but he had persistent symptoms. She called EMS. The patient was given a nebulizer in route. The patient's wife states he has had no significant increase in cough or secretions. He has otherwise been in his usual state of health over the last few days.   Past Medical History  Diagnosis Date  . CVA (cerebral infarction)     a. 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 b. Residual aphasia and torticollis. Does not ambulate - uses a motorized chair.  Marland Kitchen TIA (transient ischemic attack)     a. 2013 around time of pneumonia - x3  . Pneumonia     a. Prior hx 2013. b. Asp PNA vs CAP 04/2013.  Marland Kitchen History of jejunostomy tube placement     a. Per wife, due to strokes. No longer allowed to eat.  Marland Kitchen HTN (hypertension)   . CAD (coronary artery disease)     a. Inf STEMI 03/2013 - 95% nondom RCA, managed medically. Complicated hosp admission.  . Chronic respiratory failure     a. Required trach placement 04/2013 (#6 Shiley cuffed tracheostomy tube).  . Chronic diastolic CHF (congestive heart failure)     a. EF 45-50% by echo 03/2013.  Marland Kitchen PAF (paroxysmal atrial fibrillation)     a. Prior h/o "irreg HB"; formal dx 03/2013 at time of STEMI. b. not a candidate for anticoagulation beyond aspirin at this point due to bleeding from trach.   . Paroxysmal VT     a. During 11-04/2013 adm: sustained on admission s/p shock, NSVT during remainder of admission.  Marland Kitchen AKI (acute kidney injury)     a.  During 11-04/2013 adm with oliguria, resolved.  . Lung neoplasm     a. 04/2013 chest CT raises concern for lung malignancy but pt was not a candidate for further w/u or treatment.  . Hypospadias     a. Chronic acquired hypospadius.  . Aortic insufficiency     a. Mild by echo 03/2013.  Marland Kitchen Anemia   . Stroke    Past Surgical History  Procedure Laterality Date  . Tracheostomy tube placement N/A 04/19/2013    Procedure: TRACHEOSTOMY;  Surgeon: [name], MD;  Location: [facility];  Service: ENT;  Laterality: N/A;  . Jejunostomy feeding tube  04/09/13   Family History  Problem Relation Age of Onset  . Stroke Father    History  Substance Use Topics  . Smoking status: Never Smoker   . Smokeless tobacco: Not on file  . Alcohol Use: No  Review of Systems  Constitutional: Negative for fever and chills.  Respiratory: Positive for shortness of breath.   Cardiovascular: Negative for chest pain.  Gastrointestinal: Negative for nausea, vomiting and abdominal pain.  Neurological: Negative for weakness, numbness and headaches.  All other systems reviewed and are negative.   Allergies  Review of patient's allergies indicates no known allergies.  Home Medications   Current Outpatient  Rx  Name  Route  Sig  Dispense  Refill  . amiodarone (PACERONE) 200 MG tablet      Take 2 tablets by feeding tube daily until 05/31/13, then decrease to 1 tablet daily.   90 tablet   0   . aspirin 81 MG tablet   Per Tube   Place 1 tablet (81 mg total) into feeding tube daily.   90 tablet   0   . carvedilol (COREG) 25 MG tablet   Per Tube   Place 1 tablet (25 mg total) into feeding tube 2 (two) times daily.   180 tablet   0   . Cholecalciferol 1000 UNITS capsule   Per Tube   Place 1,000 Units into feeding tube daily.          . furosemide (LASIX) 40 MG tablet   Per Tube   Place 1 tablet (40 mg total) into feeding tube daily.   90 tablet   0   . hydrALAZINE (APRESOLINE) 25 MG tablet   Per  Tube   Place 1 tablet (25 mg total) into feeding tube every 8 (eight) hours.   270 tablet   0   . lisinopril (PRINIVIL,ZESTRIL) 20 MG tablet   Per Tube   Place 1 tablet (20 mg total) into feeding tube daily.   90 tablet   0   . oxybutynin (DITROPAN) 5 MG tablet   Per Tube   Place 5 mg into feeding tube daily.         . potassium chloride (K-DUR) 10 MEQ tablet   Per Tube   Place 20 mEq into feeding tube daily.         . simvastatin (ZOCOR) 40 MG tablet   Per Tube   Place 0.5 tablets (20 mg total) into feeding tube daily.          BP 144/77  Pulse 58  Temp(Src) 98.4 F (36.9 C) (Oral)  Resp 16  SpO2 98% Physical Exam  Constitutional: He is oriented to person, place, and time. He appears well-developed and well-nourished. No distress.  HENT:  Head: Normocephalic and atraumatic.  Mouth/Throat: No oropharyngeal exudate.  Eyes: Conjunctivae are normal. Pupils are equal, round, and reactive to light.  Neck:  Torticollis of neck. Leaning to left. Tracheostomy in place.   Cardiovascular: Normal rate and normal heart sounds.  Exam reveals no gallop and no friction rub.   No murmur heard. Pulmonary/Chest: Effort normal and breath sounds normal.  Abdominal: Soft. He exhibits no distension. There is no tenderness.  Musculoskeletal: Normal range of motion. He exhibits no edema and no tenderness.  Neurological: He is alert and oriented to person, place, and time. He has normal strength and normal reflexes. No cranial nerve deficit or sensory deficit. Coordination normal. GCS eye subscore is 4. GCS verbal subscore is 5. GCS motor subscore is 6.  Skin: Skin is warm and dry.   ED Course  Procedures (including critical care time) Labs Review Labs Reviewed  CBC WITH DIFFERENTIAL - Abnormal; Notable for the following:    WBC 10.9 (*)    RBC 3.45 (*)    Hemoglobin 11.1 (*)    HCT 34.6 (*)    MCV 100.3 (*)    RDW 16.8 (*)    Neutrophils Relative % 80 (*)    Neutro Abs 8.7 (*)     Lymphocytes Relative 11 (*)    All other components within normal limits  PRO B NATRIURETIC PEPTIDE - Abnormal; Notable for  the following:    Pro B Natriuretic peptide (BNP) 889.8 (*)    All other components within normal limits  POCT I-STAT, CHEM 8 - Abnormal; Notable for the following:    BUN 37 (*)    Hemoglobin 12.2 (*)    HCT 36.0 (*)    All other components within normal limits  TROPONIN I   Imaging Review Dg Chest Portable 1 View  05/27/2013   CLINICAL DATA:  Shortness of breath.  EXAM: PORTABLE CHEST - 1 VIEW  COMPARISON:  04/21/2013 and CT 04/28/2013  FINDINGS: The neck is flexed and the head is overlying the left side of the chest. There is a nodular density in the right lung, roughly measuring 2.4 cm and this corresponds with the nodule identified on the CT from 04/28/2013. A tracheostomy tube is present. Densities at the left lung base could represent atelectasis. Limited evaluation of the left upper lung. Heart size is within normal limits. There is improved aeration at the left lung base compared to the previous radiograph.  IMPRESSION: Persistent nodule in the right upper lung. This measures close to 2.4 cm. Findings remain concerning for a neoplasm. Consider further evaluation with a PET-CT.  Left basilar densities could represent atelectasis versus mild airspace disease.   Electronically Signed   By: Markus Daft M.D.   On: 05/27/2013 21:02   EKG Interpretation    Date/Time:  Thursday May 27 2013 21:00:16 EST Ventricular Rate:  59 PR Interval:  179 QRS Duration: 123 QT Interval:  526 QTC Calculation: 521 R Axis:   32 Text Interpretation:  Age not entered, assumed to be  73 years old for purpose of ECG interpretation Sinus rhythm Ventricular premature complex Nonspecific intraventricular conduction delay Abnormal T, consider ischemia, diffuse leads ST elevation, consider anterolateral injury No significant change since last tracing Confirmed by ALLEN  MD, ANTHONY ([id number])  on 05/27/2013 9:28:32 PM           MDM   Here with sudden onset of shortness of breath. No fevers or chills. No increase in baseline cough. AFVSS. EKG without acute changes. Troponin negative. CXR not c/w pneumonia. Doubt PE. Respiratory saw and evaluated the patient on arrival. Respiratory suctioned out a large amount of mucous and replaced the inner canula and the patient had immediate relief of his symptoms and return to baseline. SOB likely secondary to mucous plugging. The patient was monitored for several hours without the need for additional suctioning.He remained asymptomatic. The patient's wife has been trained on how to suction and feels comfortable returning home. Return precautions given and discussed with the patient and his wife who were both in agreement with the plan.    1. Shortness of breath         Donita Brooks, MD 05/28/13 1352

## 2013-05-27 NOTE — ED Notes (Signed)
Per EMS pt has hx 2 x MI and stroke and Cardiac arrest  Pt c/o of cough and difficulty breathing starting tonight. Pt from home with wife. Pt was given neb tx

## 2013-05-27 NOTE — ED Notes (Signed)
Spoke with Shawn from Lab who verified Troponin 1 will be ran from previously sent blood sample.

## 2013-05-27 NOTE — ED Notes (Signed)
MD at bedside. 

## 2013-05-28 NOTE — ED Provider Notes (Signed)
I saw and evaluated the patient, reviewed the resident's note and I agree with the findings and plan.  EKG Interpretation    Date/Time:  Thursday May 27 2013 21:00:16 EST Ventricular Rate:  59 PR Interval:  179 QRS Duration: 123 QT Interval:  526 QTC Calculation: 521 R Axis:   32 Text Interpretation:  Age not entered, assumed to be  73 years old for purpose of ECG interpretation Sinus rhythm Ventricular premature complex Nonspecific intraventricular conduction delay Abnormal T, consider ischemia, diffuse leads ST elevation, consider anterolateral injury No significant change since last tracing Confirmed by Zenia Resides  MD, Avrielle Fry ([id number]) on 05/27/2013 9:28:32 PM             Leota Jacobsen, MD 05/28/13 [phone]

## 2013-05-28 NOTE — ED Notes (Signed)
Pt is awaiting [facility] pickup

## 2013-07-09 ENCOUNTER — Emergency Department (HOSPITAL_COMMUNITY): Payer: Medicare Other

## 2013-07-09 ENCOUNTER — Inpatient Hospital Stay (HOSPITAL_COMMUNITY): Payer: Medicare Other

## 2013-07-09 ENCOUNTER — Inpatient Hospital Stay (HOSPITAL_COMMUNITY)
Admission: EM | Admit: 2013-07-09 | Discharge: 2013-07-13 | DRG: 871 | Disposition: A | Discharge status: Hospice | Payer: Medicare Other | Attending: Internal Medicine | Admitting: Internal Medicine

## 2013-07-09 ENCOUNTER — Encounter (HOSPITAL_COMMUNITY): Payer: Self-pay | Admitting: Emergency Medicine

## 2013-07-09 DIAGNOSIS — I252 Old myocardial infarction: Secondary | ICD-10-CM

## 2013-07-09 DIAGNOSIS — Z66 Do not resuscitate: Secondary | ICD-10-CM | POA: Diagnosis present

## 2013-07-09 DIAGNOSIS — I509 Heart failure, unspecified: Secondary | ICD-10-CM | POA: Diagnosis present

## 2013-07-09 DIAGNOSIS — K5900 Constipation, unspecified: Secondary | ICD-10-CM | POA: Diagnosis not present

## 2013-07-09 DIAGNOSIS — IMO0002 Reserved for concepts with insufficient information to code with codable children: Secondary | ICD-10-CM

## 2013-07-09 DIAGNOSIS — A419 Sepsis, unspecified organism: Principal | ICD-10-CM | POA: Diagnosis present

## 2013-07-09 DIAGNOSIS — R1900 Intra-abdominal and pelvic swelling, mass and lump, unspecified site: Secondary | ICD-10-CM | POA: Diagnosis present

## 2013-07-09 DIAGNOSIS — Z930 Tracheostomy status: Secondary | ICD-10-CM | POA: Diagnosis not present

## 2013-07-09 DIAGNOSIS — R6520 Severe sepsis without septic shock: Secondary | ICD-10-CM | POA: Diagnosis present

## 2013-07-09 DIAGNOSIS — J189 Pneumonia, unspecified organism: Secondary | ICD-10-CM | POA: Diagnosis present

## 2013-07-09 DIAGNOSIS — R7309 Other abnormal glucose: Secondary | ICD-10-CM | POA: Diagnosis present

## 2013-07-09 DIAGNOSIS — E861 Hypovolemia: Secondary | ICD-10-CM | POA: Diagnosis present

## 2013-07-09 DIAGNOSIS — I351 Nonrheumatic aortic (valve) insufficiency: Secondary | ICD-10-CM

## 2013-07-09 DIAGNOSIS — Z7982 Long term (current) use of aspirin: Secondary | ICD-10-CM | POA: Diagnosis not present

## 2013-07-09 DIAGNOSIS — K7200 Acute and subacute hepatic failure without coma: Secondary | ICD-10-CM | POA: Diagnosis present

## 2013-07-09 DIAGNOSIS — D649 Anemia, unspecified: Secondary | ICD-10-CM | POA: Diagnosis present

## 2013-07-09 DIAGNOSIS — D491 Neoplasm of unspecified behavior of respiratory system: Secondary | ICD-10-CM | POA: Diagnosis present

## 2013-07-09 DIAGNOSIS — J690 Pneumonitis due to inhalation of food and vomit: Secondary | ICD-10-CM | POA: Diagnosis present

## 2013-07-09 DIAGNOSIS — N179 Acute kidney failure, unspecified: Secondary | ICD-10-CM

## 2013-07-09 DIAGNOSIS — I2510 Atherosclerotic heart disease of native coronary artery without angina pectoris: Secondary | ICD-10-CM | POA: Diagnosis present

## 2013-07-09 DIAGNOSIS — B179 Acute viral hepatitis, unspecified: Secondary | ICD-10-CM | POA: Diagnosis present

## 2013-07-09 DIAGNOSIS — Z8673 Personal history of transient ischemic attack (TIA), and cerebral infarction without residual deficits: Secondary | ICD-10-CM | POA: Diagnosis not present

## 2013-07-09 DIAGNOSIS — I4891 Unspecified atrial fibrillation: Secondary | ICD-10-CM | POA: Diagnosis present

## 2013-07-09 DIAGNOSIS — I4729 Other ventricular tachycardia: Secondary | ICD-10-CM

## 2013-07-09 DIAGNOSIS — I959 Hypotension, unspecified: Secondary | ICD-10-CM | POA: Diagnosis present

## 2013-07-09 DIAGNOSIS — Z79899 Other long term (current) drug therapy: Secondary | ICD-10-CM

## 2013-07-09 DIAGNOSIS — J9610 Chronic respiratory failure, unspecified whether with hypoxia or hypercapnia: Secondary | ICD-10-CM | POA: Diagnosis present

## 2013-07-09 DIAGNOSIS — E870 Hyperosmolality and hypernatremia: Secondary | ICD-10-CM | POA: Diagnosis present

## 2013-07-09 DIAGNOSIS — I5042 Chronic combined systolic (congestive) and diastolic (congestive) heart failure: Secondary | ICD-10-CM | POA: Diagnosis present

## 2013-07-09 DIAGNOSIS — I5022 Chronic systolic (congestive) heart failure: Secondary | ICD-10-CM | POA: Diagnosis present

## 2013-07-09 DIAGNOSIS — C3490 Malignant neoplasm of unspecified part of unspecified bronchus or lung: Secondary | ICD-10-CM | POA: Diagnosis present

## 2013-07-09 DIAGNOSIS — I472 Ventricular tachycardia, unspecified: Secondary | ICD-10-CM

## 2013-07-09 DIAGNOSIS — I480 Paroxysmal atrial fibrillation: Secondary | ICD-10-CM | POA: Diagnosis present

## 2013-07-09 DIAGNOSIS — Q549 Hypospadias, unspecified: Secondary | ICD-10-CM

## 2013-07-09 DIAGNOSIS — I10 Essential (primary) hypertension: Secondary | ICD-10-CM

## 2013-07-09 DIAGNOSIS — R739 Hyperglycemia, unspecified: Secondary | ICD-10-CM | POA: Diagnosis present

## 2013-07-09 DIAGNOSIS — I498 Other specified cardiac arrhythmias: Secondary | ICD-10-CM | POA: Diagnosis not present

## 2013-07-09 DIAGNOSIS — R6521 Severe sepsis with septic shock: Secondary | ICD-10-CM

## 2013-07-09 LAB — CBC WITH DIFFERENTIAL/PLATELET
BASOS PCT: 0 % (ref 0–1)
Basophils Absolute: 0 10*3/uL (ref 0.0–0.1)
EOS ABS: 0 10*3/uL (ref 0.0–0.7)
EOS PCT: 0 % (ref 0–5)
HCT: 32.8 % — ABNORMAL LOW (ref 39.0–52.0)
HEMOGLOBIN: 10.3 g/dL — AB (ref 13.0–17.0)
LYMPHS ABS: 1.6 10*3/uL (ref 0.7–4.0)
Lymphocytes Relative: 11 % — ABNORMAL LOW (ref 12–46)
MCH: 32.4 pg (ref 26.0–34.0)
MCHC: 31.4 g/dL (ref 30.0–36.0)
MCV: 103.1 fL — AB (ref 78.0–100.0)
Monocytes Absolute: 0.7 10*3/uL (ref 0.1–1.0)
Monocytes Relative: 5 % (ref 3–12)
NEUTROS PCT: 84 % — AB (ref 43–77)
Neutro Abs: 12 10*3/uL — ABNORMAL HIGH (ref 1.7–7.7)
Platelets: 199 10*3/uL (ref 150–400)
RBC: 3.18 MIL/uL — AB (ref 4.22–5.81)
RDW: 16.2 % — ABNORMAL HIGH (ref 11.5–15.5)
WBC: 14.4 10*3/uL — ABNORMAL HIGH (ref 4.0–10.5)

## 2013-07-09 LAB — COMPREHENSIVE METABOLIC PANEL
ALBUMIN: 2.3 g/dL — AB (ref 3.5–5.2)
ALBUMIN: 1.8 g/dL — AB (ref 3.5–5.2)
ALK PHOS: 155 U/L — AB (ref 39–117)
ALT: 355 U/L — ABNORMAL HIGH (ref 0–53)
ALT: 296 U/L — ABNORMAL HIGH (ref 0–53)
AST: 159 U/L — ABNORMAL HIGH (ref 0–37)
AST: 146 U/L — ABNORMAL HIGH (ref 0–37)
Alkaline Phosphatase: 194 U/L — ABNORMAL HIGH (ref 39–117)
BILIRUBIN TOTAL: 0.3 mg/dL (ref 0.3–1.2)
BUN: 79 mg/dL — AB (ref 6–23)
BUN: 73 mg/dL — ABNORMAL HIGH (ref 6–23)
CALCIUM: 9.3 mg/dL (ref 8.4–10.5)
CHLORIDE: 121 meq/L — AB (ref 96–112)
CO2: 26 mEq/L (ref 19–32)
CO2: 23 meq/L (ref 19–32)
CREATININE: 1.71 mg/dL — AB (ref 0.50–1.35)
Calcium: 7.9 mg/dL — ABNORMAL LOW (ref 8.4–10.5)
Chloride: 118 mEq/L — ABNORMAL HIGH (ref 96–112)
Creatinine, Ser: 1.97 mg/dL — ABNORMAL HIGH (ref 0.50–1.35)
GFR calc Af Amer: 38 mL/min — ABNORMAL LOW (ref >90)
GFR calc Af Amer: 45 mL/min — ABNORMAL LOW (ref >90)
GFR calc non Af Amer: 32 mL/min — ABNORMAL LOW (ref >90)
GFR, EST NON AFRICAN AMERICAN: 38 mL/min — AB (ref >90)
GLUCOSE: 111 mg/dL — AB (ref 70–99)
Glucose, Bld: 150 mg/dL — ABNORMAL HIGH (ref 70–99)
POTASSIUM: 4 meq/L (ref 3.7–5.3)
POTASSIUM: 3.8 meq/L (ref 3.7–5.3)
Sodium: 161 mEq/L — ABNORMAL HIGH (ref 137–147)
Sodium: 158 mEq/L — ABNORMAL HIGH (ref 137–147)
TOTAL PROTEIN: 8 g/dL (ref 6.0–8.3)
Total Bilirubin: 0.4 mg/dL (ref 0.3–1.2)
Total Protein: 6.5 g/dL (ref 6.0–8.3)

## 2013-07-09 LAB — I-STAT CG4 LACTIC ACID, ED: LACTIC ACID, VENOUS: 1.1 mmol/L (ref 0.5–2.2)

## 2013-07-09 LAB — URINALYSIS, ROUTINE W REFLEX MICROSCOPIC
Bilirubin Urine: NEGATIVE
Glucose, UA: NEGATIVE mg/dL
Hgb urine dipstick: NEGATIVE
Ketones, ur: NEGATIVE mg/dL
LEUKOCYTES UA: NEGATIVE
NITRITE: NEGATIVE
PH: 5.5 (ref 5.0–8.0)
Protein, ur: NEGATIVE mg/dL
SPECIFIC GRAVITY, URINE: 1.016 (ref 1.005–1.030)
Urobilinogen, UA: 1 mg/dL (ref 0.0–1.0)

## 2013-07-09 LAB — TROPONIN I: Troponin I: <0.3 ng/mL (ref <0.30)

## 2013-07-09 LAB — LACTIC ACID, PLASMA: Lactic Acid, Venous: 1.1 mmol/L (ref 0.5–2.2)

## 2013-07-09 MED ORDER — DEXTROSE 5 % IV SOLN
1.0000 g | INTRAVENOUS | Status: DC
  Administered 2013-07-10: 1 g via INTRAVENOUS
  Filled 2013-07-09 (×4): qty 1

## 2013-07-09 MED ORDER — DEXTROSE 5 % IV SOLN
500.0000 mg | Freq: Once | INTRAVENOUS | Status: AC
  Administered 2013-07-09: 500 mg via INTRAVENOUS

## 2013-07-09 MED ORDER — OXYBUTYNIN CHLORIDE 5 MG PO TABS
5.0000 mg | ORAL_TABLET | Freq: Every day | ORAL | Status: DC
  Administered 2013-07-10 – 2013-07-13 (×4): 5 mg
  Filled 2013-07-09 (×4): qty 1

## 2013-07-09 MED ORDER — SODIUM CHLORIDE 0.9 % IV SOLN
250.0000 mL | INTRAVENOUS | Status: DC | PRN
  Administered 2013-07-10: 250 mL via INTRAVENOUS

## 2013-07-09 MED ORDER — SODIUM CHLORIDE 0.9 % IV BOLUS (SEPSIS)
1000.0000 mL | Freq: Once | INTRAVENOUS | Status: AC
  Administered 2013-07-09: 1000 mL via INTRAVENOUS

## 2013-07-09 MED ORDER — SODIUM CHLORIDE 0.9 % IV SOLN
1000.0000 mL | Freq: Once | INTRAVENOUS | Status: AC
  Administered 2013-07-09: 1000 mL via INTRAVENOUS

## 2013-07-09 MED ORDER — DEXTROSE 5 % IV SOLN
2.0000 g | Freq: Once | INTRAVENOUS | Status: AC
  Administered 2013-07-09: 2 g via INTRAVENOUS

## 2013-07-09 MED ORDER — CLINDAMYCIN PHOSPHATE 600 MG/50ML IV SOLN
600.0000 mg | Freq: Three times a day (TID) | INTRAVENOUS | Status: DC
  Administered 2013-07-09 – 2013-07-10 (×2): 600 mg via INTRAVENOUS
  Filled 2013-07-09 (×4): qty 50

## 2013-07-09 MED ORDER — SODIUM CHLORIDE 0.9 % IV SOLN
1000.0000 mL | INTRAVENOUS | Status: DC
  Administered 2013-07-09: 1000 mL via INTRAVENOUS

## 2013-07-09 MED ORDER — CLINDAMYCIN PHOSPHATE 600 MG/50ML IV SOLN: 600.0000 mg | Freq: Four times a day (QID) | INTRAVENOUS | Status: DC

## 2013-07-09 MED ORDER — AZITHROMYCIN 500 MG IV SOLR: 250.0000 mg | INTRAVENOUS | Status: DC

## 2013-07-09 MED ORDER — VANCOMYCIN HCL IN DEXTROSE 1-5 GM/200ML-% IV SOLN
1000.0000 mg | Freq: Once | INTRAVENOUS | Status: AC
  Administered 2013-07-09: 1000 mg via INTRAVENOUS
  Filled 2013-07-09: qty 200

## 2013-07-09 MED ORDER — ACETAMINOPHEN 650 MG RE SUPP
650.0000 mg | Freq: Once | RECTAL | Status: AC
  Administered 2013-07-09: 650 mg via RECTAL
  Filled 2013-07-09: qty 1

## 2013-07-09 MED ORDER — ASPIRIN 81 MG PO CHEW
81.0000 mg | CHEWABLE_TABLET | Freq: Every day | ORAL | Status: DC
  Administered 2013-07-10 – 2013-07-13 (×4): 81 mg
  Filled 2013-07-09 (×4): qty 1

## 2013-07-09 MED ORDER — INSULIN ASPART 100 UNIT/ML ~~LOC~~ SOLN
1.0000 [IU] | SUBCUTANEOUS | Status: DC
  Administered 2013-07-11 (×3): 2 [IU] via SUBCUTANEOUS

## 2013-07-09 MED ORDER — VITAMIN D3 25 MCG (1000 UNIT) PO TABS
1000.0000 [IU] | ORAL_TABLET | Freq: Every day | ORAL | Status: DC
  Administered 2013-07-10 – 2013-07-13 (×4): 1000 [IU]
  Filled 2013-07-09 (×4): qty 1

## 2013-07-09 MED ORDER — AMIODARONE HCL 200 MG PO TABS
200.0000 mg | ORAL_TABLET | Freq: Every day | ORAL | Status: DC
  Administered 2013-07-10 – 2013-07-13 (×4): 200 mg via ORAL
  Filled 2013-07-09 (×4): qty 1

## 2013-07-09 MED ORDER — HEPARIN SODIUM (PORCINE) 5000 UNIT/ML IJ SOLN
5000.0000 [IU] | Freq: Three times a day (TID) | INTRAMUSCULAR | Status: DC
  Administered 2013-07-10 – 2013-07-13 (×11): 5000 [IU] via SUBCUTANEOUS
  Filled 2013-07-09 (×16): qty 1

## 2013-07-09 MED ORDER — VANCOMYCIN HCL IN DEXTROSE 1-5 GM/200ML-% IV SOLN
1000.0000 mg | INTRAVENOUS | Status: DC
  Filled 2013-07-09: qty 200

## 2013-07-09 NOTE — Progress Notes (Addendum)
After discussion with Hospitalist Service PCCM will take over care of patient. The following is a modified version of my consult note converted to an H+P.  PULMONARY  / CRITICAL CARE MEDICINE HISTORY AND PHYSICAL EXAMINATION  Name: [name] [name] MRN: [id number] DOB: 12-Aug-1940    ADMISSION DATE:  07/09/2013  CHIEF COMPLAINT:  Hypotension   BRIEF PATIENT DESCRIPTION: 73 year-old male with innumerable medical problems presenting with hypotension likely secondary to sepsis from a pulmonary source. ? Early shock.  SIGNIFICANT EVENTS / STUDIES:  1. Lactate 1.10 on admission  LINES / TUBES:  1. Peripheral IVs  CULTURES:  1. Blood culture x2 to/20/2015 no growth to date 2. Urine culture ordered but not yet collected 3. Sputum culture pending 4. Resp Viral Panel Pending  ANTIBIOTICS:  1. Vancomycin 2/20- 2. Cefepime 2/20- 3. Azithromycin 2/20-       4.   Clinda 2/20-   HISTORY OF PRESENT ILLNESS: [name] [name] is a 73 year old male with multiple medical issues some of the highlights of which include a CVA with severe residual aphasia and torticollis, recent STEMI, and mild CHF with EF estimated between 45 and 50% who presents to [facility] after his hospice nurse took his blood pressure today and found it to be mildly reduced. His wife was at bedside and provides the history notes that over the last week he has had a cough which is been productive of alternatively yellowish and greenish sputum. She also feels that he has had a slightly decreased level of consciousness. She notes at baseline, he is unable to speak. She feels that he is perked up since he has been in the emergency room.  PAST MEDICAL HISTORY :  Past Medical History  Diagnosis Date  . CVA (cerebral infarction)     a. 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 b. Residual aphasia and torticollis. Does not ambulate - uses a motorized chair.  Marland Kitchen TIA (transient ischemic attack)     a. 2013 around time of pneumonia - x3  . Pneumonia      a. Prior hx 2013. b. Asp PNA vs CAP 04/2013.  Marland Kitchen History of jejunostomy tube placement     a. Per wife, due to strokes. No longer allowed to eat.  Marland Kitchen HTN (hypertension)   . CAD (coronary artery disease)     a. Inf STEMI 03/2013 - 95% nondom RCA, managed medically. Complicated hosp admission.  . Chronic respiratory failure     a. Required trach placement 04/2013 (#6 Shiley cuffed tracheostomy tube).  . Chronic diastolic CHF (congestive heart failure)     a. EF 45-50% by echo 03/2013.  Marland Kitchen PAF (paroxysmal atrial fibrillation)     a. Prior h/o "irreg HB"; formal dx 03/2013 at time of STEMI. b. not a candidate for anticoagulation beyond aspirin at this point due to bleeding from trach.   . Paroxysmal VT     a. During 11-04/2013 adm: sustained on admission s/p shock, NSVT during remainder of admission.  Marland Kitchen AKI (acute kidney injury)     a. During 11-04/2013 adm with oliguria, resolved.  . Lung neoplasm     a. 04/2013 chest CT raises concern for lung malignancy but pt was not a candidate for further w/u or treatment.  . Hypospadias     a. Chronic acquired hypospadius.  . Aortic insufficiency     a. Mild by echo 03/2013.  Marland Kitchen Anemia   . Stroke     Past Surgical History  Procedure Laterality Date  .  Tracheostomy tube placement N/A 04/19/2013    Procedure: TRACHEOSTOMY;  Surgeon: [name], MD;  Location: [place];  Service: ENT;  Laterality: N/A;  . Jejunostomy feeding tube  04/09/13    Prior to Admission medications   Medication Sig Start Date End Date Taking? Authorizing Provider  amiodarone (PACERONE) 200 MG tablet 200 mg by Feeding Tube route daily.   Yes Historical Provider, MD  aspirin 81 MG tablet Place 1 tablet (81 mg total) into feeding tube daily. 04/30/13  Yes Dayna N Dunn, PA-C  carvedilol (COREG) 25 MG tablet Place 1 tablet (25 mg total) into feeding tube 2 (two) times daily. 04/30/13  Yes Dayna N Dunn, PA-C  Cholecalciferol 1000 UNITS capsule Place 1,000 Units into feeding tube  daily.    Yes Historical Provider, MD  furosemide (LASIX) 40 MG tablet Take 40 mg by mouth 2 (two) times daily.   Yes Historical Provider, MD  hydrALAZINE (APRESOLINE) 25 MG tablet Place 1 tablet (25 mg total) into feeding tube every 8 (eight) hours. 04/30/13  Yes Dayna N Dunn, PA-C  lisinopril (PRINIVIL,ZESTRIL) 20 MG tablet Place 1 tablet (20 mg total) into feeding tube daily. 04/30/13  Yes Dayna N Dunn, PA-C  oxybutynin (DITROPAN) 5 MG tablet Place 5 mg into feeding tube daily.   Yes Historical Provider, MD  potassium chloride (K-DUR) 10 MEQ tablet Place 20 mEq into feeding tube daily.   Yes Historical Provider, MD  simvastatin (ZOCOR) 40 MG tablet Place 0.5 tablets (20 mg total) into feeding tube daily. 04/30/13  Yes Dayna N Dunn, PA-C  VITAMIN K PO 1,000 Units by Feeding Tube route daily.   Yes Historical Provider, MD    No Known Allergies  FAMILY HISTORY:  Family History  Problem Relation Age of Onset  . Stroke Father     SOCIAL HISTORY:  reports that he has never smoked. He does not have any smokeless tobacco history on file. He reports that he does not drink alcohol or use illicit drugs.  REVIEW OF SYSTEMS:  Unable to obtain secondary to patient condition.  PHYSICAL EXAM  VITAL SIGNS: Temp:  [101.7 F (38.7 C)] 101.7 F (38.7 C) (02/20 1727) Pulse Rate:  [63-71] 65 (02/20 2130) Resp:  [26-38] 33 (02/20 2130) BP: (82-96)/(54-62) 90/56 mmHg (02/20 2130) SpO2:  [96 %-99 %] 98 % (02/20 2130)  HEMODYNAMICS:    VENTILATOR SETTINGS:    INTAKE / OUTPUT: Intake/Output     02/20 0701 - 02/21 0700   Urine 600   Total Output 600   Net -600         PHYSICAL EXAMINATION: General: Chronically ill-appearing male lying in bed  Neuro: Patient tracks and looks at wife; occasionally nods to questions  HEENT: Sclera anicteric, conjunctiva pink. Mucous membranes moist, oropharynx clear  Neck: Tracheostomy present, no lymphadenopathy or JVD  Cardiovascular: Regular rate and  rhythm, normal S1-S2, no murmurs rubs or gallops  Lungs: Slightly decreased breath sounds at the left base  Abdomen: Firm a palpable mass in the left upper quadrant measuring approximately 10 cm in greatest diameter, nontender, nondistended, positive bowel sounds  Musculoskeletal: no clubbing cyanosis or edema  Skin: no obvious breakdown   LABS:  CBC Recent Labs     07/09/13  1809  WBC  14.4*  HGB  10.3*  HCT  32.8*  PLT  199    Coag's No results found for this basename: APTT, INR,  in the last 72 hours  BMET Recent Labs  07/09/13  1809  07/09/13  2157  NA  161*  158*  K  4.0  3.8  CL  118*  121*  CO2  26  23  BUN  79*  73*  CREATININE  1.97*  1.71*  GLUCOSE  111*  150*    Electrolytes Recent Labs     07/09/13  1809  07/09/13  2157  CALCIUM  9.3  7.9*    Sepsis Markers No results found for this basename: LACTICACIDVEN, PROCALCITON, O2SATVEN,  in the last 72 hours  ABG No results found for this basename: PHART, PCO2ART, PO2ART,  in the last 72 hours  Liver Enzymes Recent Labs     07/09/13  1809  07/09/13  2157  AST  159*  146*  ALT  355*  296*  ALKPHOS  194*  155*  BILITOT  0.4  0.3  ALBUMIN  2.3*  1.8*    Cardiac Enzymes Recent Labs     07/09/13  2158  TROPONINI  <0.30    Glucose No results found for this basename: GLUCAP,  in the last 72 hours  Imaging Dg Chest Port 1 View  07/09/2013   CLINICAL DATA:  Hypotension. Chronic ventilator dependent respiratory failure.  EXAM: PORTABLE CHEST - 1 VIEW  COMPARISON:  DG CHEST 1V PORT dated 05/27/2013; CT CHEST W/O CM dated 04/28/2013; DG CHEST 1V PORT dated 04/21/2013; DG CHEST 1V PORT dated 04/21/2013  FINDINGS: Tracheostomy tube tip in satisfactory position below the thoracic inlet approximately 5 cm above carina. Thoracic scoliosis convex right. Cardiac silhouette enlarged but stable. Pulmonary vascularity normal without evidence of pulmonary edema. Right upper lobe lung nodule as noted previously.  Consolidation in the left lower lobe. Lungs otherwise clear. Possible left pleural effusion.  IMPRESSION: 1. Left lower lobe pneumonia and possible left pleural effusion. 2. Stable cardiomegaly without pulmonary edema. 3. Right upper lobe pulmonary nodule as noted previously. 4. Tracheostomy tube satisfactory.   Electronically Signed   By: Evangeline Dakin M.D.   On: 07/09/2013 19:15   EKG: Not yet performed   CXR: Chest x-ray from this evening was personally reviewed by me. There is increased left basilar opacity which is likely a combination of atelectasis and possibly a small pleural effusion.   ASSESSMENT / PLAN: Principal Problem:   Severe sepsis with acute organ dysfunction Active Problems:   Hypotension   Aspiration pneumonia   CAD (coronary artery disease)   PAF (paroxysmal atrial fibrillation)   Acute kidney injury   Hypernatremia   HCAP (healthcare-associated pneumonia)   Chronic systolic CHF (congestive heart failure)   Acute hepatitis   Tracheostomy in place  PULMONARY A/P: 1. L basilar opacity: The most likely source is the recurrent left-sided pneumonia. Mr. Salvucci has aspirated in the past and, as such, anaerobic coverage is warranted.  2. Tracheostomy: No evidence of respiratory failure  Standard Trach Care  Supplemental O2 to keep Sats > 92%  May require ultrasound to evaluate pleural effusion for thoracentesis  CARDIOVASCULAR A/P:  1. Severe Sepsis/Mild Hypotension:  2. History of VT  Continue aggressive fluid resuscitation, would give at least 3 L bolus   Serial lactates   Check EKG, serial troponins given recent STEMI   Holding home lasix, anti-hypertensives  Continue home amiodarone  RENAL A/P:  1. Acute kidney injury: Most likely secondary to sepsis/dehydration. Dehydration is supported by the presence of hypernatremia.  2. Hypernatremia: Almost certainly secondary to dehydration   Fluid resuscitation; monitor sodium Q4 to 6 to ensure  appropriate reduction  Monitor creatinine with fluid resuscitation; does not return to baseline may require more aggressive evaluation   GASTROINTESTINAL A/P:  1. Acute hepatitis: Likely secondary to dehydration/Sepsis.  2. LUQ Mass: Given nontender nature suspect stool ball.   Serial CMP's  Consider liver ultrasound  KUB  HEMATOLOGIC A/P:   1. Anemia: Mild. Almost Certainly AOCD. No iron studies.  Check Iron Studies  INFECTIOUS A/P: 1. HCAP vs. Aspiration Pneumonia:   Empiric therapy with vancomycin, cefepime, azithromycin, and clindamycin pending further culture results   ENDOCRINE A/P: 1. Hyperglycemia: Unclear if patient diabetic. No A1c ins sytem.  SSI  Check A1c  NEUROLOGIC A/P: 1. No acute issue  BEST PRACTICE / DISPOSITION Level of Care:  ICU Consultants:  None Code Status:  Full, confirmed with wife. Diet:  NPO DVT Px:  SQH GI Px:  Not indicated Skin Integrity:  Not evaluated Social / Family:  Wife updated at bedside  TODAY'S SUMMARY:   I have personally obtained a history, examined the patient, evaluated laboratory and imaging results, formulated the assessment and plan and placed orders.  CRITICAL CARE: The patient is critically ill with multiple organ systems failure and requires high complexity decision making for assessment and support, frequent evaluation and titration of therapies, application of advanced monitoring technologies and extensive interpretation of multiple databases. Critical Care Time devoted to patient care services described in this note is 45 minutes.   Margarette Asal, MD Pulmonary and [place] Pager: [phone]  07/09/2013, 11:28 PM

## 2013-07-09 NOTE — ED Notes (Signed)
Portable XR at bedside

## 2013-07-09 NOTE — H&P (Signed)
[facility] History and Physical  [name]:[id number] DOB: 15-Aug-1940 DOA: 07/09/2013  Referring physician: EDP PCP: [name], MD   Chief Complaint: Hypotension   HPI: [name] [name] is a 73 y.o. male with numerous medical problems who presents to the ED with cough and hypotension.  Cough productive of white and yellow sputum.  Patient was seen at home hospice care and BP noted 70/40 today which prompted him being brought to the ED.  Work up in the ED demonstrates multifocal PNA HCAP vs aspiration, vs obstructive, sepsis with hypotension, severe dehydration with hyponatremia.  Review of Systems: Systems reviewed.  As above, otherwise negative  Past Medical History  Diagnosis Date  . CVA (cerebral infarction)     a. 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 b. Residual aphasia and torticollis. Does not ambulate - uses a motorized chair.  Marland Kitchen TIA (transient ischemic attack)     a. 2013 around time of pneumonia - x3  . Pneumonia     a. Prior hx 2013. b. Asp PNA vs CAP 04/2013.  Marland Kitchen History of jejunostomy tube placement     a. Per wife, due to strokes. No longer allowed to eat.  Marland Kitchen HTN (hypertension)   . CAD (coronary artery disease)     a. Inf STEMI 03/2013 - 95% nondom RCA, managed medically. Complicated hosp admission.  . Chronic respiratory failure     a. Required trach placement 04/2013 (#6 Shiley cuffed tracheostomy tube).  . Chronic diastolic CHF (congestive heart failure)     a. EF 45-50% by echo 03/2013.  Marland Kitchen PAF (paroxysmal atrial fibrillation)     a. Prior h/o "irreg HB"; formal dx 03/2013 at time of STEMI. b. not a candidate for anticoagulation beyond aspirin at this point due to bleeding from trach.   . Paroxysmal VT     a. During 11-04/2013 adm: sustained on admission s/p shock, NSVT during remainder of admission.  Marland Kitchen AKI (acute kidney injury)     a. During 11-04/2013 adm with oliguria, resolved.  . Lung neoplasm     a. 04/2013 chest CT raises concern for  lung malignancy but pt was not a candidate for further w/u or treatment.  . Hypospadias     a. Chronic acquired hypospadius.  . Aortic insufficiency     a. Mild by echo 03/2013.  Marland Kitchen Anemia   . Stroke    Past Surgical History  Procedure Laterality Date  . Tracheostomy tube placement N/A 04/19/2013    Procedure: TRACHEOSTOMY;  Surgeon: [name], MD;  Location: [place];  Service: ENT;  Laterality: N/A;  . Jejunostomy feeding tube  04/09/13   Social History:  reports that he has never smoked. He does not have any smokeless tobacco history on file. He reports that he does not drink alcohol or use illicit drugs.  No Known Allergies  Family History  Problem Relation Age of Onset  . Stroke Father      Prior to Admission medications   Medication Sig Start Date End Date Taking? Authorizing Provider  amiodarone (PACERONE) 200 MG tablet 200 mg by Feeding Tube route daily.   Yes Historical Provider, MD  aspirin 81 MG tablet Place 1 tablet (81 mg total) into feeding tube daily. 04/30/13  Yes Dayna N Dunn, PA-C  carvedilol (COREG) 25 MG tablet Place 1 tablet (25 mg total) into feeding tube 2 (two) times daily. 04/30/13  Yes Dayna N Dunn, PA-C  Cholecalciferol 1000 UNITS capsule Place 1,000 Units into feeding tube daily.  Yes Historical Provider, MD  furosemide (LASIX) 40 MG tablet Take 40 mg by mouth 2 (two) times daily.   Yes Historical Provider, MD  hydrALAZINE (APRESOLINE) 25 MG tablet Place 1 tablet (25 mg total) into feeding tube every 8 (eight) hours. 04/30/13  Yes Dayna N Dunn, PA-C  lisinopril (PRINIVIL,ZESTRIL) 20 MG tablet Place 1 tablet (20 mg total) into feeding tube daily. 04/30/13  Yes Dayna N Dunn, PA-C  oxybutynin (DITROPAN) 5 MG tablet Place 5 mg into feeding tube daily.   Yes Historical Provider, MD  potassium chloride (K-DUR) 10 MEQ tablet Place 20 mEq into feeding tube daily.   Yes Historical Provider, MD  simvastatin (ZOCOR) 40 MG tablet Place 0.5 tablets (20 mg total)  into feeding tube daily. 04/30/13  Yes Dayna N Dunn, PA-C  VITAMIN K PO 1,000 Units by Feeding Tube route daily.   Yes Historical Provider, MD   Physical Exam: Filed Vitals:   07/09/13 2130  BP: 90/56  Pulse: 65  Temp:   Resp: 33    BP 90/56  Pulse 65  Temp(Src) 101.7 F (38.7 C) (Rectal)  Resp 33  SpO2 98%  General Appearance:    Chronically appearing  Head:    Normocephalic, atraumatic  Eyes:    PERRL, EOMI, sclera non-icteric        Nose:   Nares without drainage or epistaxis. Mucosa, turbinates normal  Throat:   Moist mucous membranes. Oropharynx without erythema or exudate.  Neck:   Supple. No carotid bruits.  No thyromegaly.  No lymphadenopathy. Trach in place  Back:     No CVA tenderness, no spinal tenderness  Lungs:     Coarse BS bilaterally  Chest wall:    No tenderness to palpitation  Heart:    Regular rate and rhythm without murmurs, gallops, rubs  Abdomen:     Soft, non-tender, nondistended, normal bowel sounds, no organomegaly, palpable mass in LUQ.  Genitalia:    deferred  Rectal:    deferred  Extremities:   No clubbing, cyanosis or edema.  Pulses:   2+ and symmetric all extremities  Skin:   Skin color, texture, turgor normal, no rashes or lesions  Lymph nodes:   Cervical, supraclavicular, and axillary nodes normal  Neurologic:  Patient tracks and looks at wife, will nod to questions, holding wifes hand.    Labs on Admission:  Basic Metabolic Panel:  Recent Labs Lab 07/09/13 1809 07/09/13 2157  NA 161* 158*  K 4.0 3.8  CL 118* 121*  CO2 26 23  GLUCOSE 111* 150*  BUN 79* 73*  CREATININE 1.97* 1.71*  CALCIUM 9.3 7.9*   Liver Function Tests:  Recent Labs Lab 07/09/13 1809 07/09/13 2157  AST 159* 146*  ALT 355* 296*  ALKPHOS 194* 155*  BILITOT 0.4 0.3  PROT 8.0 6.5  ALBUMIN 2.3* 1.8*   No results found for this basename: LIPASE, AMYLASE,  in the last 168 hours No results found for this basename: AMMONIA,  in the last 168  hours CBC:  Recent Labs Lab 07/09/13 1809  WBC 14.4*  NEUTROABS 12.0*  HGB 10.3*  HCT 32.8*  MCV 103.1*  PLT 199   Cardiac Enzymes:  Recent Labs Lab 07/09/13 2158  TROPONINI <0.30    BNP (last 3 results)  Recent Labs  05/27/13 2115  PROBNP 889.8*   CBG: No results found for this basename: GLUCAP,  in the last 168 hours  Radiological Exams on Admission: Dg Chest Port 1 View  07/09/2013  CLINICAL DATA:  Hypotension. Chronic ventilator dependent respiratory failure.  EXAM: PORTABLE CHEST - 1 VIEW  COMPARISON:  DG CHEST 1V PORT dated 05/27/2013; CT CHEST W/O CM dated 04/28/2013; DG CHEST 1V PORT dated 04/21/2013; DG CHEST 1V PORT dated 04/21/2013  FINDINGS: Tracheostomy tube tip in satisfactory position below the thoracic inlet approximately 5 cm above carina. Thoracic scoliosis convex right. Cardiac silhouette enlarged but stable. Pulmonary vascularity normal without evidence of pulmonary edema. Right upper lobe lung nodule as noted previously. Consolidation in the left lower lobe. Lungs otherwise clear. Possible left pleural effusion.  IMPRESSION: 1. Left lower lobe pneumonia and possible left pleural effusion. 2. Stable cardiomegaly without pulmonary edema. 3. Right upper lobe pulmonary nodule as noted previously. 4. Tracheostomy tube satisfactory.   Electronically Signed   By: Evangeline Dakin M.D.   On: 07/09/2013 19:15    EKG: Independently reviewed.  Assessment/Plan Principal Problem:   Severe sepsis with acute organ dysfunction Active Problems:   Hypotension   Acute kidney injury   Hypernatremia   Acute hepatitis   HCAP (healthcare-associated pneumonia)   1. HCAP and dehydration resulting in severe sepsis with hypotension - Cefepime, vanc, and clinda for HCAP / aspiration PNA / obstructive PNA.  After talking with PCCM, patient will be admitted to ICU initially given that his blood pressure still remains low and very borderline in ED despite 3L IVF thus far (minimal  to no improvement what so ever).  Patient normally has HTN at baseline (and is on 4 BP meds at home).  Appreciate ICU management. 2. AKI - secondary to #1, strict intake and output, and monitoring renal function 3. Hypernatremia - Q6H BMPs ordered to follow, appears initially to be improving with IVF. 4. Abdominal mass - suspect stool ball, less likely neoplasm, although neoplasm is already strongly suspected in this patient and he is not a candidate for treatment at this point anyhow.  KUB pending.  Discussion - patient has very poor long term prognosis with multiple medical problems including probable metastatic lung cancer for which he is not a candidate for treatment, Trach after a PNA admission in November.  However, family does not wish to consider de-escalation of care at this time.  Code Status: Full Code  Family Communication: Family at bedside Disposition Plan: Admit to ICU   Time spent: 80 min  Briah Nary M. [facility] Pager [phone]  If 7AM-7PM, please contact the day team taking care of the patient [email or web address] Password [facility] 07/09/2013, 10:53 PM

## 2013-07-09 NOTE — ED Provider Notes (Signed)
CSN: [id number]     Arrival date & time 07/09/13  1707 History   First MD Initiated Contact with Patient 07/09/13 1708     Chief Complaint  Patient presents with  . Hypotension     (Consider location/radiation/quality/duration/timing/severity/associated sxs/prior Treatment) Patient is a 73 y.o. male presenting with cough. The history is provided by the patient, the EMS personnel and the spouse.  Cough Cough characteristics:  Productive Sputum characteristics:  White and yellow Severity:  Moderate Onset quality:  Sudden Timing:  Constant Progression:  Worsening Chronicity:  New Smoker: no   Context: not sick contacts   Relieved by:  Nothing Worsened by:  Nothing tried Ineffective treatments:  None tried Associated symptoms: shortness of breath   Associated symptoms: no chest pain, no chills and no fever     Past Medical History  Diagnosis Date  . CVA (cerebral infarction)     a. 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 b. Residual aphasia and torticollis. Does not ambulate - uses a motorized chair.  Marland Kitchen TIA (transient ischemic attack)     a. 2013 around time of pneumonia - x3  . Pneumonia     a. Prior hx 2013. b. Asp PNA vs CAP 04/2013.  Marland Kitchen History of jejunostomy tube placement     a. Per wife, due to strokes. No longer allowed to eat.  Marland Kitchen HTN (hypertension)   . CAD (coronary artery disease)     a. Inf STEMI 03/2013 - 95% nondom RCA, managed medically. Complicated hosp admission.  . Chronic respiratory failure     a. Required trach placement 04/2013 (#6 Shiley cuffed tracheostomy tube).  . Chronic diastolic CHF (congestive heart failure)     a. EF 45-50% by echo 03/2013.  Marland Kitchen PAF (paroxysmal atrial fibrillation)     a. Prior h/o "irreg HB"; formal dx 03/2013 at time of STEMI. b. not a candidate for anticoagulation beyond aspirin at this point due to bleeding from trach.   . Paroxysmal VT     a. During 11-04/2013 adm: sustained on admission s/p shock, NSVT during remainder of  admission.  Marland Kitchen AKI (acute kidney injury)     a. During 11-04/2013 adm with oliguria, resolved.  . Lung neoplasm     a. 04/2013 chest CT raises concern for lung malignancy but pt was not a candidate for further w/u or treatment.  . Hypospadias     a. Chronic acquired hypospadius.  . Aortic insufficiency     a. Mild by echo 03/2013.  Marland Kitchen Anemia   . Stroke    Past Surgical History  Procedure Laterality Date  . Tracheostomy tube placement N/A 04/19/2013    Procedure: TRACHEOSTOMY;  Surgeon: [name], MD;  Location: [place];  Service: ENT;  Laterality: N/A;  . Jejunostomy feeding tube  04/09/13   Family History  Problem Relation Age of Onset  . Stroke Father    History  Substance Use Topics  . Smoking status: Never Smoker   . Smokeless tobacco: Not on file  . Alcohol Use: No    Review of Systems  Unable to perform ROS: Patient nonverbal  Constitutional: Negative for fever and chills.  Respiratory: Positive for cough and shortness of breath.   Cardiovascular: Negative for chest pain and leg swelling.  All other systems reviewed and are negative.      Allergies  Review of patient's allergies indicates no known allergies.  Home Medications   Current Outpatient Rx  Name  Route  Sig  Dispense  Refill  . amiodarone (PACERONE) 200 MG tablet      Take 2 tablets by feeding tube daily until 05/31/13, then decrease to 1 tablet daily.   90 tablet   0   . aspirin 81 MG tablet   Per Tube   Place 1 tablet (81 mg total) into feeding tube daily.   90 tablet   0   . carvedilol (COREG) 25 MG tablet   Per Tube   Place 1 tablet (25 mg total) into feeding tube 2 (two) times daily.   180 tablet   0   . Cholecalciferol 1000 UNITS capsule   Per Tube   Place 1,000 Units into feeding tube daily.          . furosemide (LASIX) 40 MG tablet   Per Tube   Place 1 tablet (40 mg total) into feeding tube daily.   90 tablet   0   . hydrALAZINE (APRESOLINE) 25 MG tablet   Per  Tube   Place 1 tablet (25 mg total) into feeding tube every 8 (eight) hours.   270 tablet   0   . lisinopril (PRINIVIL,ZESTRIL) 20 MG tablet   Per Tube   Place 1 tablet (20 mg total) into feeding tube daily.   90 tablet   0   . oxybutynin (DITROPAN) 5 MG tablet   Per Tube   Place 5 mg into feeding tube daily.         . potassium chloride (K-DUR) 10 MEQ tablet   Per Tube   Place 20 mEq into feeding tube daily.         . simvastatin (ZOCOR) 40 MG tablet   Per Tube   Place 0.5 tablets (20 mg total) into feeding tube daily.          BP 82/57  Pulse 71  Resp 31  SpO2 97% Physical Exam  Nursing note and vitals reviewed. Constitutional: He appears well-developed and well-nourished. No distress.  HENT:  Head: Normocephalic and atraumatic.  Mouth/Throat: No oropharyngeal exudate.  Eyes: EOM are normal. Pupils are equal, round, and reactive to light.  Neck: Normal range of motion. Neck supple.  Cardiovascular: Normal rate and regular rhythm.  Exam reveals no friction rub.   No murmur heard. Pulmonary/Chest: Effort normal. No respiratory distress. He has decreased breath sounds in the left upper field, the left middle field and the left lower field. He has no wheezes. He has no rhonchi. He has no rales.  Trach in place, c/d/i   Abdominal: Soft. He exhibits no distension. There is no tenderness. There is no rebound.  Musculoskeletal: Normal range of motion. He exhibits no edema.  Neurological: He is alert. He exhibits abnormal muscle tone (R side flaccid).  Skin: No rash noted. He is not diaphoretic.    ED Course  Procedures (including critical care time) Labs Review Labs Reviewed  CULTURE, BLOOD (ROUTINE X 2)  CULTURE, BLOOD (ROUTINE X 2)  URINE CULTURE  CBC WITH DIFFERENTIAL  COMPREHENSIVE METABOLIC PANEL  URINALYSIS, ROUTINE W REFLEX MICROSCOPIC   Imaging Review Dg Chest Port 1 View  07/09/2013   CLINICAL DATA:  Hypotension. Chronic ventilator dependent  respiratory failure.  EXAM: PORTABLE CHEST - 1 VIEW  COMPARISON:  DG CHEST 1V PORT dated 05/27/2013; CT CHEST W/O CM dated 04/28/2013; DG CHEST 1V PORT dated 04/21/2013; DG CHEST 1V PORT dated 04/21/2013  FINDINGS: Tracheostomy tube tip in satisfactory position below the thoracic inlet approximately 5 cm above carina. Thoracic scoliosis convex  right. Cardiac silhouette enlarged but stable. Pulmonary vascularity normal without evidence of pulmonary edema. Right upper lobe lung nodule as noted previously. Consolidation in the left lower lobe. Lungs otherwise clear. Possible left pleural effusion.  IMPRESSION: 1. Left lower lobe pneumonia and possible left pleural effusion. 2. Stable cardiomegaly without pulmonary edema. 3. Right upper lobe pulmonary nodule as noted previously. 4. Tracheostomy tube satisfactory.   Electronically Signed   By: Evangeline Dakin M.D.   On: 07/09/2013 19:15    EKG Interpretation   None      CRITICAL CARE Performed by: Osvaldo Shipper   Total critical care time: 30 minutes  Critical care time was exclusive of separately billable procedures and treating other patients.  Critical care was necessary to treat or prevent imminent or life-threatening deterioration.  Critical care was time spent personally by me on the following activities: development of treatment plan with patient and/or surrogate as well as nursing, discussions with consultants, evaluation of patient's response to treatment, examination of patient, obtaining history from patient or surrogate, ordering and performing treatments and interventions, ordering and review of laboratory studies, ordering and review of radiographic studies, pulse oximetry and re-evaluation of patient's condition.  MDM   Final diagnoses:  HCAP (healthcare-associated pneumonia)  Sepsis    73 year old male with history of lung cancer, multiple strokes on hospice presents with concerns for pneumonia. He's been on hospice for the  past week. Hospice nurse today noted a low blood pressure in the 70s. 250 cc of saline was given with increase in his blood pressure. Wife noted a creamy, frothy sputum which hospice nurse was concerned for infection. She called in Levaquin, however he has not started it. He was recently admitted to the hospital. Here initial pressure 82/57, no tachycardia. He's not hypoxic with sats in the mid 90s on room air. He does have some wet sounding respirations he was trach and a lot of mucus in his mouth. He has decreased breath sounds on left side. He is chronically posture leaning to the left. I am concerned about left-sided pneumonia based on my clinical exam. We will start sepsis protocol with fluids, HCAP Antibiotic coverage, labs Critical Care consulted, evaluated patient, state without elevated lactate, patient stable for stepdown under medicine. Hospitalist evaluated, uncomfortable with his softer pressures. Critical Care will admit. I have reviewed all labs and imaging and considered them in my medical decision making.    Osvaldo Shipper, MD 07/09/13 [phone]

## 2013-07-09 NOTE — ED Notes (Signed)
Per [facility] pt from home, pt's Hospice came to visit today noted a BP of 70/40 which was the same for [facility], IV started 250 ml bolus given SBP 100, just prior to arrival manual BP 108/60, HR 74, RR 20, O2 sats 100% RA, temp 99.9, pt feels warm to touch and complaining of feeling hot. Hospice RN is requesting to have his abd mass evaluated and evaluation for a productive cough with malodorous yellow sputum coming from his trach. She called in Levaquin today but pt has not started medication

## 2013-07-09 NOTE — Consult Note (Signed)
PULMONARY  / CRITICAL CARE MEDICINE CONSULTATION  Name: [name] [name] MRN: [id number] DOB: [birth year]    ADMISSION DATE:  07/09/2013  CHIEF COMPLAINT:  Hypotension  BRIEF PATIENT DESCRIPTION: 73 year-old male with innumerable medical problems presenting with hypotension likely secondary to sepsis from a pulmonary source. No evidence of shock.   SIGNIFICANT EVENTS / STUDIES:  1. Lactate 1.10 on admission  LINES / TUBES: 1. Peripheral IVs  CULTURES: 1. Blood culture x2 to/20/2015 no growth to date 2. Urine culture ordered but not yet collected 3. Sputum culture pending 4. Resp Viral Panel Pending  ANTIBIOTICS: 1. Vancomycin 2/20- 2. Cefepime 2/20- 3. Azithromycin 2/20- 4.   Clinda 2/20-   HISTORY OF PRESENT ILLNESS: Mr. 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 His wife was at bedside and provides the history notes that over the last week he has had a cough which is been productive of alternatively yellowish and greenish sputum. She also feels that he has had a slightly decreased level of consciousness. She notes at baseline, he is unable to speak. She feels that he is perked up since he has been in the emergency room.   PAST MEDICAL HISTORY :  Past Medical History  Diagnosis Date  . CVA (cerebral infarction)     a. 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 b. Residual aphasia and torticollis. Does not ambulate - uses a motorized chair.  Marland Kitchen TIA (transient ischemic attack)     a. 2013 around time of pneumonia - x3  . Pneumonia     a. Prior hx 2013. b. Asp PNA vs CAP 04/2013.  Marland Kitchen History of jejunostomy tube placement     a. Per wife, due to strokes. No longer allowed to eat.  Marland Kitchen HTN (hypertension)   .  CAD (coronary artery disease)     a. Inf STEMI 03/2013 - 95% nondom RCA, managed medically. Complicated hosp admission.  . Chronic respiratory failure     a. Required trach placement 04/2013 (#6 Shiley cuffed tracheostomy tube).  . Chronic diastolic CHF (congestive heart failure)     a. EF 45-50% by echo 03/2013.  Marland Kitchen PAF (paroxysmal atrial fibrillation)     a. Prior h/o "irreg HB"; formal dx 03/2013 at time of STEMI. b. not a candidate for anticoagulation beyond aspirin at this point due to bleeding from trach.   . Paroxysmal VT     a. During 11-04/2013 adm: sustained on admission s/p shock, NSVT during remainder of admission.  Marland Kitchen AKI (acute kidney injury)     a. During 11-04/2013 adm with oliguria, resolved.  . Lung neoplasm     a. 04/2013 chest CT raises concern for lung malignancy but pt was not a candidate for further w/u or treatment.  . Hypospadias     a. Chronic acquired hypospadius.  . Aortic insufficiency     a. Mild by echo 03/2013.  Marland Kitchen Anemia   . Stroke     Past Surgical History  Procedure Laterality Date  . Tracheostomy tube placement N/A 04/19/2013    Procedure: TRACHEOSTOMY;  Surgeon: [name], MD;  Location: [facility];  Service: ENT;  Laterality: N/A;  . Jejunostomy feeding tube  04/09/13    Prior to  Admission medications   Medication Sig Start Date End Date Taking? Authorizing Provider  amiodarone (PACERONE) 200 MG tablet 200 mg by Feeding Tube route daily.   Yes Historical Provider, MD  aspirin 81 MG tablet Place 1 tablet (81 mg total) into feeding tube daily. 04/30/13  Yes Dayna N Dunn, PA-C  carvedilol (COREG) 25 MG tablet Place 1 tablet (25 mg total) into feeding tube 2 (two) times daily. 04/30/13  Yes Dayna N Dunn, PA-C  Cholecalciferol 1000 UNITS capsule Place 1,000 Units into feeding tube daily.    Yes Historical Provider, MD  furosemide (LASIX) 40 MG tablet Take 40 mg by mouth 2 (two) times daily.   Yes Historical Provider, MD  hydrALAZINE (APRESOLINE) 25 MG  tablet Place 1 tablet (25 mg total) into feeding tube every 8 (eight) hours. 04/30/13  Yes Dayna N Dunn, PA-C  lisinopril (PRINIVIL,ZESTRIL) 20 MG tablet Place 1 tablet (20 mg total) into feeding tube daily. 04/30/13  Yes Dayna N Dunn, PA-C  oxybutynin (DITROPAN) 5 MG tablet Place 5 mg into feeding tube daily.   Yes Historical Provider, MD  potassium chloride (K-DUR) 10 MEQ tablet Place 20 mEq into feeding tube daily.   Yes Historical Provider, MD  simvastatin (ZOCOR) 40 MG tablet Place 0.5 tablets (20 mg total) into feeding tube daily. 04/30/13  Yes Dayna N Dunn, PA-C  VITAMIN K PO 1,000 Units by Feeding Tube route daily.   Yes Historical Provider, MD    No Known Allergies  FAMILY HISTORY:  Family History  Problem Relation Age of Onset  . Stroke Father     SOCIAL HISTORY:  reports that he has never smoked. He does not have any smokeless tobacco history on file. He reports that he does not drink alcohol or use illicit drugs.  REVIEW OF SYSTEMS:  Unable to obtain secondary to patient condition.  PHYSICAL EXAM  VITAL SIGNS: Temp:  [101.7 F (38.7 C)] 101.7 F (38.7 C) (02/20 1727) Pulse Rate:  [63-71] 65 (02/20 2030) Resp:  [26-38] 33 (02/20 2030) BP: (82-96)/(54-62) 94/59 mmHg (02/20 2030) SpO2:  [96 %-99 %] 98 % (02/20 2030)  HEMODYNAMICS:    VENTILATOR SETTINGS:    INTAKE / OUTPUT: Intake/Output     02/20 0701 - 02/21 0700   Urine 600   Total Output 600   Net -600         PHYSICAL EXAMINATION: General:  Chronically ill-appearing male lying in bed Neuro:  Patient tracks and looks at wife; occasionally nods to questions HEENT:  Sclera anicteric, conjunctiva pink. Mucous membranes moist, oropharynx clear Neck:  Tracheostomy present, no lymphadenopathy or JVD Cardiovascular:  Regular rate and rhythm, normal S1-S2, no murmurs rubs or gallops Lungs:  Slightly decreased breath sounds at the left base  Abdomen:  Firm a palpable mass in the left upper quadrant measuring  approximately 10 cm in greatest diameter, nontender, nondistended, positive bowel sounds Musculoskeletal:  no clubbing cyanosis or edema Skin:  no obvious breakdown  LABS:  CBC Recent Labs     07/09/13  1809  WBC  14.4*  HGB  10.3*  HCT  32.8*  PLT  199    Coag's No results found for this basename: APTT, INR,  in the last 72 hours  BMET Recent Labs     07/09/13  1809  NA  161*  K  4.0  CL  118*  CO2  26  BUN  79*  CREATININE  1.97*  GLUCOSE  111*    Electrolytes Recent  Labs     07/09/13  1809  CALCIUM  9.3    Sepsis Markers No results found for this basename: LACTICACIDVEN, PROCALCITON, O2SATVEN,  in the last 72 hours  ABG No results found for this basename: PHART, PCO2ART, PO2ART,  in the last 72 hours  Liver Enzymes Recent Labs     07/09/13  1809  AST  159*  ALT  355*  ALKPHOS  194*  BILITOT  0.4  ALBUMIN  2.3*    Cardiac Enzymes No results found for this basename: TROPONINI, PROBNP,  in the last 72 hours  Glucose No results found for this basename: GLUCAP,  in the last 72 hours  Imaging Dg Chest Port 1 View  07/09/2013   CLINICAL DATA:  Hypotension. Chronic ventilator dependent respiratory failure.  EXAM: PORTABLE CHEST - 1 VIEW  COMPARISON:  DG CHEST 1V PORT dated 05/27/2013; CT CHEST W/O CM dated 04/28/2013; DG CHEST 1V PORT dated 04/21/2013; DG CHEST 1V PORT dated 04/21/2013  FINDINGS: Tracheostomy tube tip in satisfactory position below the thoracic inlet approximately 5 cm above carina. Thoracic scoliosis convex right. Cardiac silhouette enlarged but stable. Pulmonary vascularity normal without evidence of pulmonary edema. Right upper lobe lung nodule as noted previously. Consolidation in the left lower lobe. Lungs otherwise clear. Possible left pleural effusion.  IMPRESSION: 1. Left lower lobe pneumonia and possible left pleural effusion. 2. Stable cardiomegaly without pulmonary edema. 3. Right upper lobe pulmonary nodule as noted previously. 4.  Tracheostomy tube satisfactory.   Electronically Signed   By: Evangeline Dakin M.D.   On: 07/09/2013 19:15   EKG: Not yet performed CXR: Chest x-ray from this evening was personally reviewed by me. There is increased left basilar opacity which is likely a combination of atelectasis and possibly a small pleural effusion.  ASSESSMENT / PLAN: Principal Problem:   Sepsis Active Problems:   Hypotension   Acute kidney injury   Hypernatremia   Acute hepatitis    1. Sepsis/Mild Hypotension: The most likely source is the recurrent left-sided pneumonia. Mr. Sarin has aspirated in the past and, as such, anaerobic coverage is warranted.   Continue aggressive fluid resuscitation, would give at least 3 L bolus  Serial lactates  Check EKG, serial troponins given recent STEMI  Empiric therapy with vancomycin, cefepime, azithromycin, and clindamycin pending further culture results  May require ultrasound to evaluate pleural effusion for thoracentesis  2. Acute kidney injury: Most likely secondary to sepsis/dehydration. Dehydration is supported by the presence of hypernatremia.  Monitor creatinine with fluid resuscitation; does not return to baseline may require more aggressive evaluation  3. Hypernatremia: Almost certainly secondary to dehydration  Fluid resuscitation; monitor sodium Q4 to 6 to ensure appropriate reduction  4. Acute hepatitis: Likely secondary to dehydration  Serial CMP's  Consider liver ultrasound  5. LUQ Mass: Given nontender nature suspect stool ball.  KUB  I have personally obtained a history, examined the patient, evaluated laboratory and imaging results, formulated the assessment and plan and placed orders.  Margarette Asal, MD [facility] [place] Pager: [phone]  07/09/2013, 9:27 PM

## 2013-07-09 NOTE — Progress Notes (Addendum)
ANTIBIOTIC CONSULT NOTE - INITIAL  Pharmacy Consult for vancomycin + cefepime Indication: rule out pneumonia  No Known Allergies  Patient Measurements:   Adjusted Body Weight:   Vital Signs: Temp: 101.7 F (38.7 C) (02/20 1727) Temp src: Rectal (02/20 1727) BP: 93/58 mmHg (02/20 1900) Pulse Rate: 65 (02/20 1900) Intake/Output from previous day:   Intake/Output from this shift:    Labs:  Recent Labs  07/09/13 1809  WBC 14.4*  HGB 10.3*  PLT 199  CREATININE 1.97*   The CrCl is unknown because both a height and weight (above a minimum accepted value) are required for this calculation. No results found for this basename: VANCOTROUGH, VANCOPEAK, VANCORANDOM, GENTTROUGH, GENTPEAK, GENTRANDOM, TOBRATROUGH, TOBRAPEAK, TOBRARND, AMIKACINPEAK, AMIKACINTROU, AMIKACIN,  in the last 72 hours   Microbiology: No results found for this or any previous visit (from the past 720 hour(s)).  Medical History: Past Medical History  Diagnosis Date  . CVA (cerebral infarction)     a. 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 b. Residual aphasia and torticollis. Does not ambulate - uses a motorized chair.  Marland Kitchen TIA (transient ischemic attack)     a. 2013 around time of pneumonia - x3  . Pneumonia     a. Prior hx 2013. b. Asp PNA vs CAP 04/2013.  Marland Kitchen History of jejunostomy tube placement     a. Per wife, due to strokes. No longer allowed to eat.  Marland Kitchen HTN (hypertension)   . CAD (coronary artery disease)     a. Inf STEMI 03/2013 - 95% nondom RCA, managed medically. Complicated hosp admission.  . Chronic respiratory failure     a. Required trach placement 04/2013 (#6 Shiley cuffed tracheostomy tube).  . Chronic diastolic CHF (congestive heart failure)     a. EF 45-50% by echo 03/2013.  Marland Kitchen PAF (paroxysmal atrial fibrillation)     a. Prior h/o "irreg HB"; formal dx 03/2013 at time of STEMI. b. not a candidate for anticoagulation beyond aspirin at this point due to bleeding from trach.   . Paroxysmal VT      a. During 11-04/2013 adm: sustained on admission s/p shock, NSVT during remainder of admission.  Marland Kitchen AKI (acute kidney injury)     a. During 11-04/2013 adm with oliguria, resolved.  . Lung neoplasm     a. 04/2013 chest CT raises concern for lung malignancy but pt was not a candidate for further w/u or treatment.  . Hypospadias     a. Chronic acquired hypospadius.  . Aortic insufficiency     a. Mild by echo 03/2013.  Marland Kitchen Anemia   . Stroke     Medications:  Anti-infectives   Start     Dose/Rate Route Frequency Ordered Stop   07/10/13 1830  vancomycin (VANCOCIN) IVPB 1000 mg/200 mL premix     1,000 mg 200 mL/hr over 60 Minutes Intravenous Every 24 hours 07/09/13 1918     07/10/13 1830  ceFEPIme (MAXIPIME) 1 g in dextrose 5 % 50 mL IVPB     1 g 100 mL/hr over 30 Minutes Intravenous Every 24 hours 07/09/13 1918     07/09/13 1745  ceFEPIme (MAXIPIME) 2 g in dextrose 5 % 50 mL IVPB     2 g 100 mL/hr over 30 Minutes Intravenous  Once 07/09/13 1733 07/09/13 1859   07/09/13 1745  vancomycin (VANCOCIN) IVPB 1000 mg/200 mL premix     1,000 mg 200 mL/hr over 60 Minutes Intravenous  Once 07/09/13 1733  Assessment: 33 yom presented to the ED from home hospice with productive cough and hypotension. To start empiric vancomycin + cefepime for pneumonia and sepsis. Tmax is 101.7, WBC is 14.4 and Scr is elevated at 1.97. First doses given within 1 hour or code sepsis being called.   Vanc 2/20>> Cefepime 2/20>>  Goal of Therapy:  Vancomycin trough level 15-20 mcg/ml  Plan:  1. Vancomycin 1gm IV Q24H 2. Cefepime 2gm IV x 1 then 1gm IV Q24H 3. F/u renal fxn, C&S, clinical status and trough at [place], Rande Lawman 07/09/2013,7:18 PM  UPDATE Patient will also begin clindamycin Clindamycin 600 mg IV q8h  Hughes Better, PharmD, BCPS Clinical Pharmacist Pager: [phone] 07/09/2013 9:46 PM

## 2013-07-09 NOTE — ED Notes (Signed)
EDP [place] at bedside.

## 2013-07-09 NOTE — ED Notes (Signed)
Critical Care fellow in room with pt.  Per Dr Lowella Fairy, hold 3rd liter of NS at current

## 2013-07-09 NOTE — ED Notes (Addendum)
Marijean Heath, LPN, pt's Hospice RN, states pt's BP was 70/40, waited a little while retook pressure and it was 50/20, pt has a new nodule to his RUQ, odor noted to sputum, and a productive cough with yellow/white colored mucus. Pt was started on Levaquin today.

## 2013-07-09 NOTE — ED Notes (Signed)
Hospice contact number, [phone] [facility], case manager is Heloise Ochoa

## 2013-07-10 DIAGNOSIS — D649 Anemia, unspecified: Secondary | ICD-10-CM

## 2013-07-10 DIAGNOSIS — K7200 Acute and subacute hepatic failure without coma: Secondary | ICD-10-CM

## 2013-07-10 DIAGNOSIS — R1900 Intra-abdominal and pelvic swelling, mass and lump, unspecified site: Secondary | ICD-10-CM

## 2013-07-10 LAB — BASIC METABOLIC PANEL
BUN: 70 mg/dL — ABNORMAL HIGH (ref 6–23)
CALCIUM: 7.7 mg/dL — AB (ref 8.4–10.5)
CO2: 23 mEq/L (ref 19–32)
CREATININE: 1.57 mg/dL — AB (ref 0.50–1.35)
Chloride: 122 mEq/L — ABNORMAL HIGH (ref 96–112)
GFR calc Af Amer: 49 mL/min — ABNORMAL LOW (ref >90)
GFR, EST NON AFRICAN AMERICAN: 43 mL/min — AB (ref >90)
GLUCOSE: 115 mg/dL — AB (ref 70–99)
Potassium: 3.5 mEq/L — ABNORMAL LOW (ref 3.7–5.3)
Sodium: 158 mEq/L — ABNORMAL HIGH (ref 137–147)

## 2013-07-10 LAB — IRON AND TIBC
Iron: 22 ug/dL — ABNORMAL LOW (ref 42–135)
Saturation Ratios: 18 % — ABNORMAL LOW (ref 20–55)
TIBC: 119 ug/dL — AB (ref 215–435)
UIBC: 97 ug/dL — ABNORMAL LOW (ref 125–400)

## 2013-07-10 LAB — PROCALCITONIN: PROCALCITONIN: 0.83 ng/mL

## 2013-07-10 LAB — GLUCOSE, CAPILLARY
GLUCOSE-CAPILLARY: 110 mg/dL — AB (ref 70–99)
Glucose-Capillary: 103 mg/dL — ABNORMAL HIGH (ref 70–99)
Glucose-Capillary: 107 mg/dL — ABNORMAL HIGH (ref 70–99)
Glucose-Capillary: 108 mg/dL — ABNORMAL HIGH (ref 70–99)
Glucose-Capillary: 95 mg/dL (ref 70–99)
Glucose-Capillary: 88 mg/dL (ref 70–99)

## 2013-07-10 LAB — MRSA PCR SCREENING: MRSA by PCR: POSITIVE — AB

## 2013-07-10 LAB — MAGNESIUM: MAGNESIUM: 2.8 mg/dL — AB (ref 1.5–2.5)

## 2013-07-10 LAB — LACTIC ACID, PLASMA: Lactic Acid, Venous: 0.7 mmol/L (ref 0.5–2.2)

## 2013-07-10 LAB — HEMOGLOBIN A1C
HEMOGLOBIN A1C: 6.1 % — AB (ref <5.7)
Mean Plasma Glucose: 128 mg/dL — ABNORMAL HIGH (ref <117)

## 2013-07-10 LAB — PHOSPHORUS: PHOSPHORUS: 3.1 mg/dL (ref 2.3–4.6)

## 2013-07-10 LAB — FERRITIN: Ferritin: 614 ng/mL — ABNORMAL HIGH (ref 22–322)

## 2013-07-10 LAB — TROPONIN I: Troponin I: <0.3 ng/mL (ref <0.30)

## 2013-07-10 MED ORDER — CHLORHEXIDINE GLUCONATE CLOTH 2 % EX PADS
6.0000 | MEDICATED_PAD | Freq: Every day | CUTANEOUS | Status: DC
  Administered 2013-07-11 – 2013-07-13 (×3): 6 via TOPICAL

## 2013-07-10 MED ORDER — DEXTROSE 5 % IV SOLN
INTRAVENOUS | Status: DC
  Administered 2013-07-10 – 2013-07-12 (×4): via INTRAVENOUS

## 2013-07-10 MED ORDER — POTASSIUM CHLORIDE 20 MEQ/15ML (10%) PO LIQD
40.0000 meq | Freq: Once | ORAL | Status: AC
Start: 2013-07-10 — End: 2013-07-10
  Administered 2013-07-10: 40 meq via ORAL
  Filled 2013-07-10: qty 30

## 2013-07-10 MED ORDER — JEVITY 1.2 CAL PO LIQD
1000.0000 mL | ORAL | Status: DC
  Administered 2013-07-10 – 2013-07-13 (×4): 1000 mL
  Filled 2013-07-10: qty 237
  Filled 2013-07-10 (×6): qty 1000

## 2013-07-10 MED ORDER — POLYETHYLENE GLYCOL 3350 17 G PO PACK
17.0000 g | PACK | Freq: Every day | ORAL | Status: DC
  Administered 2013-07-10 – 2013-07-12 (×2): 17 g via ORAL
  Filled 2013-07-10 (×4): qty 1

## 2013-07-10 MED ORDER — PRO-STAT SUGAR FREE PO LIQD
30.0000 mL | Freq: Two times a day (BID) | ORAL | Status: DC
  Administered 2013-07-10 – 2013-07-13 (×7): 30 mL
  Filled 2013-07-10 (×10): qty 30

## 2013-07-10 MED ORDER — BISACODYL 10 MG RE SUPP
10.0000 mg | Freq: Once | RECTAL | Status: AC
  Administered 2013-07-10: 10 mg via RECTAL
  Filled 2013-07-10: qty 1

## 2013-07-10 MED ORDER — MUPIROCIN 2 % EX OINT
1.0000 "application " | TOPICAL_OINTMENT | Freq: Two times a day (BID) | CUTANEOUS | Status: DC
  Administered 2013-07-11 – 2013-07-13 (×5): 1 via NASAL
  Filled 2013-07-10: qty 22

## 2013-07-10 MED ORDER — MAGNESIUM SULFATE 40 MG/ML IJ SOLN
2.0000 g | Freq: Once | INTRAMUSCULAR | Status: AC
  Administered 2013-07-10: 2 g via INTRAVENOUS
  Filled 2013-07-10: qty 50

## 2013-07-10 MED ORDER — VITAL HIGH PROTEIN PO LIQD
1000.0000 mL | ORAL | Status: DC
  Filled 2013-07-10 (×2): qty 1000

## 2013-07-10 NOTE — Progress Notes (Signed)
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 Noted to be in shock from likely pulmonary source with BP in the 80's/50's.  SIGNIFICANT EVENTS / STUDIES:  2/20 lactate 1.1 2/21- no pressors, no vent support  LINES / TUBES: Trach - previous PIV  CULTURES: 2/20 Blood culture  2/20 Urine culture  2/20 Sputum culture pending 2/20 Resp Viral Panel Pending  ANTIBIOTICS: 2/20 vanc  2/20 cefepime 2/20 azothro >>2/21 2/20 clinda>>>2/21  SUBJECTIVE: feeling better this morning. Blood pressure improved.  VITAL SIGNS: Temp:  [97.4 F (36.3 C)-101.7 F (38.7 C)] 97.4 F (36.3 C) (02/21 0348) Pulse Rate:  [58-71] 61 (02/21 0700) Resp:  [14-38] 27 (02/21 0700) BP: (80-140)/(54-94) 134/69 mmHg (02/21 0700) SpO2:  [92 %-100 %] 100 % (02/21 0700) FiO2 (%):  [28 %] 28 % (02/21 0700) Weight:  [62.8 kg (138 lb 7.2 oz)] 62.8 kg (138 lb 7.2 oz) (02/21 0126) HEMODYNAMICS:   VENTILATOR SETTINGS: Vent Mode:  [-]  FiO2 (%):  [28 %] 28 % INTAKE / OUTPUT: Intake/Output     02/20 0701 - 02/21 0700 02/21 0701 - 02/22 0700   I.V. (mL/kg) 71.3 (1.1)    Total Intake(mL/kg) 71.3 (1.1)    Urine (mL/kg/hr) 600    Total Output 600     Net -528.7            PHYSICAL EXAMINATION: General: Chronically ill-appearing male lying in bed  Neuro: alert, nods head and mouths words to answer questions HEENT: NCAT, trach in place, no distress Cardiovascular: rrr, no murmurs appreciated  Lungs: coarse breath sounds anteriorly  Abdomen: Firm  a palpable mass in the left lower quadrant nontender, nondistended, positive bowel sounds  Musculoskeletal: no edema  Skin: no obvious breakdown  LABS:  CBC  Recent Labs Lab 07/09/13 1809  WBC 14.4*  HGB 10.3*  HCT 32.8*  PLT 199   Coag's No results found for this basename: APTT, INR,  in the last 168 hours BMET  Recent Labs Lab 07/09/13 1809 07/09/13 2157 07/10/13 0330  NA 161* 158* 158*  K 4.0 3.8 3.5*  CL 118* 121* 122*  CO2 26 23 23   BUN 79* 73* 70*  CREATININE 1.97* 1.71* 1.57*  GLUCOSE 111* 150* 115*   Electrolytes  Recent Labs Lab 07/09/13 1809 07/09/13 2157 07/10/13 0330  CALCIUM 9.3 7.9* 7.7*   Sepsis Markers  Recent Labs Lab 07/09/13 1937 07/09/13 2156 07/10/13 0330  LATICACIDVEN 1.10 1.1 0.7   ABG No results found for this basename: PHART, PCO2ART, PO2ART,  in the last 168 hours Liver Enzymes  Recent Labs Lab 07/09/13 1809 07/09/13 2157  AST 159* 146*  ALT 355* 296*  ALKPHOS 194* 155*  BILITOT 0.4 0.3  ALBUMIN 2.3* 1.8*   Cardiac Enzymes  Recent Labs Lab 07/09/13 2158 07/10/13 0330  TROPONINI <0.30 <0.30   Glucose  Recent Labs Lab 07/10/13 0126 07/10/13 0347  GLUCAP 103* 107*    Imaging Dg Abd 1 View  07/09/2013   CLINICAL DATA:  Mass.  EXAM: ABDOMEN - 1 VIEW  COMPARISON:  DG CHEST 1V PORT dated 07/09/2013; CT CHEST W/O CM dated 04/28/2013  FINDINGS: No bowel distention. The catheters noted of the mid abdomen. This may represent a gastrostomy catheter. Bowel loops displaced centrally. This suggests presence of ascites. No acute bony abnormality. Degenerative changes lumbar spine and both hips.  IMPRESSION: 1. Probable ascites.  No prominent bowel distention. 2. Catheter noted over the upper mid abdomen, this most likely a gastrostomy tube. No evidence of gastric distention or bowel distention.   Electronically Signed   By: Warner   On: 07/09/2013 23:44   US Abdomen Complete  07/10/2013   CLINICAL DATA:   Elevated LFTs. Question of left upper quadrant abdominal mass.  EXAM: ULTRASOUND ABDOMEN COMPLETE  COMPARISON:  None.  FINDINGS: Gallbladder:  Multiple large stones are seen filling the gallbladder, measuring up to 1.0 cm in size. The gallbladder wall is borderline prominent, without evidence of pericholecystic fluid. No ultrasonographic Murphy's sign is elicited.  Common bile duct:  Diameter: 0.6 cm, within normal limits in caliber.  Liver:  Mildly coarsened echotexture noted, raising question for mild fatty infiltration. No focal lesion identified.  IVC:  No abnormality visualized.  Pancreas:  Mild visualized due to overlying structures.  Spleen:  Size and appearance within normal limits.  Right Kidney:  Length: 13.0 cm. Diffusely increased parenchymal echogenicity noted. Multiple cysts are seen arising from the right kidney, measuring up to 4.9 x 4.4 x 4.2 cm.  Left Kidney:  Length: 12.0 cm. Diffusely increased parenchymal echogenicity is noted. Multiple cysts are seen arising from the left kidney, measuring up to 4.2 x 3.7 x 2.6 cm.  Abdominal aorta:  Mildly distended proximally, measuring up to 3.1 cm, raising concern for mild aneurysmal dilatation.  Other findings:  None.  IMPRESSION: 1. Cholelithiasis; gallbladder otherwise unremarkable in appearance, without evidence for obstruction or cholecystitis. 2. Mildly coarsened hepatic echotexture raises question for mild fatty infiltration. 3. Diffusely increased renal cortical echogenicity raises concern for medical renal disease. 4. Bilateral renal cysts seen. 5. 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 6. No abdominal mass identified.   Electronically Signed   By: Garald Balding M.D.   On: 07/10/2013 01:14   Dg Chest Port 1 View  07/09/2013   CLINICAL DATA:  Hypotension. Chronic ventilator dependent respiratory failure.  EXAM: PORTABLE CHEST - 1 VIEW  COMPARISON:  DG CHEST 1V PORT dated  05/27/2013; CT CHEST W/O CM dated 04/28/2013; DG CHEST 1V PORT dated 04/21/2013; DG CHEST 1V PORT dated 04/21/2013  FINDINGS: Tracheostomy tube tip in satisfactory position below the thoracic inlet approximately 5 cm above carina. Thoracic scoliosis convex right. Cardiac silhouette enlarged but stable. Pulmonary vascularity normal without evidence of pulmonary edema. Right upper lobe lung nodule as noted previously. Consolidation in the left lower lobe. Lungs otherwise clear. Possible left pleural effusion.  IMPRESSION: 1. Left lower lobe pneumonia and possible left pleural effusion. 2. Stable cardiomegaly without pulmonary edema. 3. Right upper lobe pulmonary nodule as noted previously. 4. Tracheostomy tube satisfactory.   Electronically Signed   By: Evangeline Dakin M.D.   On: 07/09/2013 19:15     CXR: LLL infiltrate / atx, NO defined infiltrate  ASSESSMENT / PLAN:  PULMONARY  A/P:  1. PNA HCAP vs asp, not clear 2. Tracheostomy: No evidence of respiratory failure 3. Hx lung neoplasm Standard Trach Care  Supplemental O2 to keep Sats > 92%  Antibiotics per above Will better define code  status and avoidance of heroics, vent  CARDIOVASCULAR  A/P:  1. Sepsis/Mild Hypotension: improved, more likely hypovolemia 2. History of VT 3. QTc prolongation  4. CHF 5. Paroxysmal A fib  6. H/o HTN Serial lactates - normal range thus far Holding home lasix, anti-hypertensives  Continue home amiodarone Allow pos balance Assess mag, k with qtc, avoid qtc prolonging meds mag supp, empiric as intracellular do not correlate serum  RENAL  A/P:  1. Acute kidney injury: Most likely secondary to sepsis. Improving.   2. Hypernatremia: secondary to dehydration  3. Hypokalemia Fluid resuscitation;  Monitor BMET Add D5W @ 40/hr, increase, bmet in am  Replete K with qtc  GASTROINTESTINAL  A/P:  1. Acute hepatitis: Likely secondary to Sepsis. Improving. 2. LUQ Mass: Given nontender nature suspect stool  ball. 3. constipation  Abd [place] without mass lesion miralax and dulcolax suppository Restart tf   HEMATOLOGIC  A/P:  1. Anemia: Mild.   Follow CBC  Heparin sq  INFECTIOUS  A/P:  1. HCAP Maybe vs NO PNA at all Empiric therapy with vancomycin (dc), cefepime, maintain (more impressed hypovolemia) Dc clinda PCt algo may be able to dc abx all together F/u cultures  ENDOCRINE  A/P:  1. Hyperglycemia: Glucose 150. Unclear if patient diabetic. No A1c ins sytem. SSI  Check A1c  NEUROLOGIC  A/P:  1. No acute issue  TODAY'S SUMMARY: continue antibiotics, continue volume, improved with volume, not impressed pna, to floor, triad Will revisit code status WOULD BE MEDICALLY ineffective to offer life support of any kind I have personally obtained a history, examined the patient, evaluated laboratory and imaging results, formulated the assessment and plan and placed orders.  Lavon Paganini. Titus Mould, MD, [place] Pgr: [facility]  [facility] [place] Pager: [phone]  07/10/2013, 7:30 AM

## 2013-07-10 NOTE — Progress Notes (Signed)
Patient ID: [name] [name], male   DOB: 1940/12/17, 73 y.o.   MRN: [id number]  I have had extensive discussions with family and pt. We discussed patients current circumstances and organ failures. We also discussed patient's prior wishes under circumstances such as this. Family has decided to NOT perform resuscitation if arrest but to continue current medical support for now.  No vent, no pressors, no escallation Continued medical care  Lavon Paganini. Titus Mould, MD, [place] Pgr: [facility]

## 2013-07-10 NOTE — Progress Notes (Signed)
RT Note: Pt up from ED on RA with #6cuffed Shiley in place. Per wife changed a home 07/02/2013. BBS rhonchi, Inner cannula changed due to being almost occluded with thick yellow secretions.Pt suctioned and placed pt on 28% ATC however when wife arrived she took TC off stating he doesn't like to wear it. Extra trach and inner cannulas at bedside.

## 2013-07-10 NOTE — Plan of Care (Signed)
Problem: Phase I Progression Outcomes Goal: Voiding-avoid urinary catheter unless indicated Outcome: Not Progressing Pt has multiple areas of skin breakdown in perineal and scrotal area. Urinary catheter suggested to keep that skin from exposure to urine and to avoid additional skin breakdown.

## 2013-07-10 NOTE — Progress Notes (Signed)
INITIAL NUTRITION ASSESSMENT  DOCUMENTATION CODES Per approved criteria  -Not Applicable   INTERVENTION: Utilize 26M PEPuP Protocol: initiate TF via PEG tube with Jevity 1.2 at 25 ml/h and Prostat 30 ml BID on day 1; on day 2, increase to goal rate of 55 ml/h (1320 ml per day) to provide 1784 kcals, 103 gm protein, 1065 ml free water daily. RD to follow for nutrition care plan  NUTRITION DIAGNOSIS: Inadequate oral intake related to inability to eat as evidenced by NPO status  Goal: Pt to meet >/= 90% of their estimated nutrition needs   Monitor:  TF regimen & tolerance, respiratory status, weight, labs, I/O's  Reason for Assessment: Consult  73 y.o. 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 Noted to be in shock from likely pulmonary source with BP in the 80's/50's.  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 In addition, pt consumes meals and snacks throughout the day.  Patient currently on trach collar.  No vent support or pressors per family wishes.  Per weight readings, patient has had 13% weight loss since November 2014 -- severe for time frame.  RD unable to complete Nutrition Focused Physical Exam at this time.  RD consulted for TF initiation & management.  Height: Ht Readings from Last 1 Encounters:  07/10/13 5\' 8"  (1.727 m)    Weight: Wt Readings from Last 1 Encounters:  07/10/13 138 lb 7.2 oz (62.8 kg)    Ideal Body Weight: 154 lb  % Ideal Body Weight:  90%  Wt Readings from Last 15 Encounters:  07/10/13 138 lb 7.2 oz (62.8 kg)  04/30/13 160 lb 12.8 oz (72.938 kg)  04/30/13 160 lb 12.8 oz (72.938 kg)  04/30/13 160 lb 12.8 oz (72.938 kg)  04/09/13 158 kg   Usual Body Weight: 160 lb -- December 2014  % Usual Body Weight: 86%  BMI:  Body mass index is 21.06 kg/(m^2).  Estimated Nutritional Needs: Kcal: 1700-1900 Protein: 95-105 gm Fluid: 1.7-1.9 L  Skin: Intact  Diet Order: NPO  EDUCATION NEEDS: -No education needs identified at this time   Intake/Output Summary (Last 24 hours) at 07/10/13 0954 Last data filed at 07/10/13 0800  Gross per 24 hour  Intake 111.33 ml  Output    600 ml  Net -488.67 ml    Labs:   Recent Labs Lab 07/09/13 1809 07/09/13 2157 07/10/13 0330  NA 161* 158* 158*  K 4.0 3.8 3.5*  CL 118* 121* 122*  CO2 26 23 23   BUN 79* 73* 70*  CREATININE 1.97* 1.71* 1.57*  CALCIUM 9.3 7.9* 7.7*  GLUCOSE 111* 150* 115*    CBG (last 3)   Recent Labs  07/10/13 0126 07/10/13 0347 07/10/13 0729  GLUCAP 103* 107* 108*    Scheduled Meds: . amiodarone  200 mg Oral Daily  . aspirin  81 mg Per Tube Daily  . bisacodyl  10 mg Rectal Once  . ceFEPime (MAXIPIME) IV  1 g Intravenous Q24H  . cholecalciferol  1,000 Units Per Tube Daily  . feeding supplement (VITAL HIGH PROTEIN)  1,000 mL Per Tube Q24H  . heparin  5,000 Units Subcutaneous 3 times per day  . insulin aspart  1-3 Units Subcutaneous 6 times per day  . magnesium sulfate 1 - 4 g bolus IVPB  2 g Intravenous Once  . oxybutynin  5 mg Per Tube Daily  . polyethylene glycol  17 g Oral Daily  . potassium chloride  40 mEq Oral Once    Continuous Infusions: . dextrose      Past Medical History  Diagnosis Date  . CVA (cerebral infarction)     a. 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 b. Residual aphasia and torticollis. Does not ambulate - uses a motorized chair.  Marland Kitchen TIA (transient ischemic attack)     a. 2013 around time of pneumonia - x3  .  Pneumonia     a. Prior hx 2013. b. Asp PNA vs CAP 04/2013.  Marland Kitchen History of jejunostomy tube placement     a. Per wife, due to strokes. No longer allowed to eat.  Marland Kitchen HTN (hypertension)   . CAD (coronary artery disease)     a. Inf STEMI 03/2013 - 95% nondom RCA, managed medically. Complicated hosp admission.  . Chronic respiratory failure     a. Required trach placement 04/2013 (#6 Shiley cuffed tracheostomy tube).  . Chronic diastolic CHF (congestive heart failure)     a. EF 45-50% by echo 03/2013.  Marland Kitchen PAF (paroxysmal atrial fibrillation)     a. Prior h/o "irreg HB"; formal dx 03/2013 at time of STEMI. b. not a candidate for anticoagulation beyond aspirin at this point due to bleeding from trach.   . Paroxysmal VT     a. During 11-04/2013 adm: sustained on admission s/p shock, NSVT during remainder of admission.  Marland Kitchen AKI (acute kidney injury)     a. During 11-04/2013 adm with oliguria, resolved.  . Lung neoplasm     a. 04/2013 chest CT raises concern for lung malignancy but pt was not a candidate for further w/u or treatment.  . Hypospadias     a. Chronic acquired hypospadius.  . Aortic insufficiency     a. Mild by echo 03/2013.  Marland Kitchen Anemia   . Stroke     Past Surgical History  Procedure Laterality Date  . Tracheostomy tube placement N/A 04/19/2013    Procedure: TRACHEOSTOMY;  Surgeon: [name], MD;  Location: [facility];  Service: ENT;  Laterality: N/A;  . Jejunostomy feeding tube  04/09/13    Arthur Holms, RD, LDN Pager #: [phone] After-Hours Pager #: [phone]

## 2013-07-10 NOTE — Progress Notes (Signed)
CRITICAL VALUE ALERT  Critical value received:  Abnormal EKG  Date of notification:  07/10/13  Time of notification:  0221  Critical value read back:yes(ekg obtained)  Nurse who received alert:  Corrinne Eagle RN  MD notified (1st page):  Dr. Eloisa Northern.(CCM )resident  Time of first page:  0225  MD notified (2nd page):NA  Time of second page:NA  Responding MD:  Dr Joseph Art K(ccm resident)  Time MD responded:  [phone]

## 2013-07-10 NOTE — Progress Notes (Signed)
Received page of abnormal ekg results. Discussed findings with Dr. Tamala Julian. There was some concern for anteriolateral ischemia. EKG appears unchanged from prior ekg in system. Trop negative x1. QTc is mildly prolongated (537). This is a slight rise from prior EKG.  BP 117/69  Pulse 59  Temp(Src) 101.7 F (38.7 C) (Rectal)  Resp 30  Ht 5\' 8"  (1.727 m)  Wt 138 lb 7.2 oz (62.8 kg)  BMI 21.06 kg/m2  SpO2 100%  Gen: Chronically ill, trach pt. CV: RRR. Mildly bradycardic intermittently.  Lungs: mildy decreased lung sounds LLL. Mild Rhonchi anteriorly.   A/P:  - Continue to cycle trop, first neg. EKG unchanged from prior, likely not ischemic related.  - QTc prolongation- will hold azithromycin (CAP coverage). Continue amiodarone at this time. Levaquin not believed to better for QTc, with amio use. Could consider ceftriaxone and doxycycline if patient remains febrile,or condition worsens. - Continue vanc, maxipime and clinda (HCAP and aspiration PNA coverage)  Howard Pouch DO PGY-2

## 2013-07-10 NOTE — Plan of Care (Signed)
Problem: Phase I Progression Outcomes Goal: Pain controlled with appropriate interventions Outcome: Completed/Met Date Met:  07/10/13 Pt reports having no pain, other than when skin around decubitus ulcers is manipulated.

## 2013-07-11 DIAGNOSIS — A419 Sepsis, unspecified organism: Secondary | ICD-10-CM

## 2013-07-11 DIAGNOSIS — I2510 Atherosclerotic heart disease of native coronary artery without angina pectoris: Secondary | ICD-10-CM

## 2013-07-11 DIAGNOSIS — Z8673 Personal history of transient ischemic attack (TIA), and cerebral infarction without residual deficits: Secondary | ICD-10-CM

## 2013-07-11 DIAGNOSIS — I4891 Unspecified atrial fibrillation: Secondary | ICD-10-CM

## 2013-07-11 LAB — BASIC METABOLIC PANEL
BUN: 50 mg/dL — ABNORMAL HIGH (ref 6–23)
CO2: 18 mEq/L — ABNORMAL LOW (ref 19–32)
Calcium: 8 mg/dL — ABNORMAL LOW (ref 8.4–10.5)
Chloride: 124 mEq/L — ABNORMAL HIGH (ref 96–112)
Creatinine, Ser: 1.11 mg/dL (ref 0.50–1.35)
GFR calc Af Amer: 75 mL/min — ABNORMAL LOW (ref >90)
GFR calc non Af Amer: 65 mL/min — ABNORMAL LOW (ref >90)
GLUCOSE: 166 mg/dL — AB (ref 70–99)
POTASSIUM: 4.1 meq/L (ref 3.7–5.3)
Sodium: 154 mEq/L — ABNORMAL HIGH (ref 137–147)

## 2013-07-11 LAB — GLUCOSE, CAPILLARY
GLUCOSE-CAPILLARY: 157 mg/dL — AB (ref 70–99)
GLUCOSE-CAPILLARY: 168 mg/dL — AB (ref 70–99)
Glucose-Capillary: 150 mg/dL — ABNORMAL HIGH (ref 70–99)
Glucose-Capillary: 151 mg/dL — ABNORMAL HIGH (ref 70–99)

## 2013-07-11 LAB — URINE CULTURE
CULTURE: NO GROWTH
Colony Count: NO GROWTH

## 2013-07-11 MED ORDER — DEXTROSE 5 % IV SOLN
1.0000 g | Freq: Two times a day (BID) | INTRAVENOUS | Status: DC
  Administered 2013-07-11 – 2013-07-13 (×5): 1 g via INTRAVENOUS
  Filled 2013-07-11 (×7): qty 1

## 2013-07-11 MED ORDER — CARVEDILOL 3.125 MG PO TABS
3.1250 mg | ORAL_TABLET | Freq: Two times a day (BID) | ORAL | Status: DC
  Administered 2013-07-12 (×2): 3.125 mg via ORAL
  Filled 2013-07-11 (×4): qty 1

## 2013-07-11 NOTE — Progress Notes (Signed)
ANTIBIOTIC CONSULT NOTE  Pharmacy Consult for cefepime Indication: rule out pneumonia  No Known Allergies  Patient Measurements: Height: 5\' 8"  (172.7 cm) Weight: 148 lb 5.9 oz (67.3 kg) IBW/kg (Calculated) : 68.4 Adjusted Body Weight:   Vital Signs: Temp: 98.6 F (37 C) (02/22 0457) Temp src: Oral (02/21 2200) BP: 119/61 mmHg (02/22 0457) Pulse Rate: 64 (02/22 0843) Intake/Output from previous day: 02/21 0701 - 02/22 0700 In: 2691.3 [I.V.:1954.3; NG/GT:737] Out: 2 [Urine:1; Stool:1] Intake/Output from this shift:    Labs:  Recent Labs  07/09/13 1809 07/09/13 2157 07/10/13 0330 07/11/13 0625  WBC 14.4*  --   --   --   HGB 10.3*  --   --   --   PLT 199  --   --   --   CREATININE 1.97* 1.71* 1.57* 1.11   Estimated Creatinine Clearance: 58.1 ml/min (by C-G formula based on Cr of 1.11). No results found for this basename: VANCOTROUGH, Corlis Leak, VANCORANDOM, [place], GENTPEAK, GENTRANDOM, TOBRATROUGH, TOBRAPEAK, TOBRARND, AMIKACINPEAK, AMIKACINTROU, AMIKACIN,  in the last 72 hours   Microbiology: Recent Results (from the past 720 hour(s))  MRSA PCR SCREENING     Status: Abnormal   Collection Time    07/10/13  1:25 AM      Result Value Ref Range Status   MRSA by PCR POSITIVE (*) NEGATIVE Final   Comment:            The GeneXpert MRSA Assay (FDA     approved for NASAL specimens     only), is one component of a     comprehensive MRSA colonization     surveillance program. It is not     intended to diagnose MRSA     infection nor to guide or     monitor treatment for     MRSA infections.     RESULT CALLED TO, READ BACK BY AND VERIFIED WITH:     NOTIFIED C.FORCHA,RN 07/10/13 AT 0310 BY RHOLMES    Medical History: Past Medical History  Diagnosis Date  . CVA (cerebral infarction)     a. 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 b. Residual aphasia and torticollis. Does not ambulate - uses a motorized chair.  Marland Kitchen TIA (transient ischemic attack)     a. 2013 around time  of pneumonia - x3  . Pneumonia     a. Prior hx 2013. b. Asp PNA vs CAP 04/2013.  Marland Kitchen History of jejunostomy tube placement     a. Per wife, due to strokes. No longer allowed to eat.  Marland Kitchen HTN (hypertension)   . CAD (coronary artery disease)     a. Inf STEMI 03/2013 - 95% nondom RCA, managed medically. Complicated hosp admission.  . Chronic respiratory failure     a. Required trach placement 04/2013 (#6 Shiley cuffed tracheostomy tube).  . Chronic diastolic CHF (congestive heart failure)     a. EF 45-50% by echo 03/2013.  Marland Kitchen PAF (paroxysmal atrial fibrillation)     a. Prior h/o "irreg HB"; formal dx 03/2013 at time of STEMI. b. not a candidate for anticoagulation beyond aspirin at this point due to bleeding from trach.   . Paroxysmal VT     a. During 11-04/2013 adm: sustained on admission s/p shock, NSVT during remainder of admission.  Marland Kitchen AKI (acute kidney injury)     a. During 11-04/2013 adm with oliguria, resolved.  . Lung neoplasm     a. 04/2013 chest CT raises concern for lung malignancy but pt  was not a candidate for further w/u or treatment.  . Hypospadias     a. Chronic acquired hypospadius.  . Aortic insufficiency     a. Mild by echo 03/2013.  Marland Kitchen Anemia   . Stroke     Medications:  Anti-infectives   Start     Dose/Rate Route Frequency Ordered Stop   07/10/13 2100  azithromycin (ZITHROMAX) 250 mg in dextrose 5 % 125 mL IVPB  Status:  Discontinued     250 mg 125 mL/hr over 60 Minutes Intravenous Every 24 hours 07/09/13 2115 07/10/13 0301   07/10/13 1830  vancomycin (VANCOCIN) IVPB 1000 mg/200 mL premix  Status:  Discontinued     1,000 mg 200 mL/hr over 60 Minutes Intravenous Every 24 hours 07/09/13 1918 07/10/13 0925   07/10/13 1830  ceFEPIme (MAXIPIME) 1 g in dextrose 5 % 50 mL IVPB     1 g 100 mL/hr over 30 Minutes Intravenous Every 24 hours 07/09/13 1918     07/10/13 0000  clindamycin (CLEOCIN) IVPB 600 mg  Status:  Discontinued     600 mg 100 mL/hr over 30 Minutes  Intravenous 4 times per day 07/09/13 2135 07/09/13 2144   07/09/13 2200  clindamycin (CLEOCIN) IVPB 600 mg  Status:  Discontinued     600 mg 100 mL/hr over 30 Minutes Intravenous 3 times per day 07/09/13 2144 07/10/13 0914   07/09/13 2130  azithromycin (ZITHROMAX) 500 mg in dextrose 5 % 250 mL IVPB     500 mg 250 mL/hr over 60 Minutes Intravenous  Once 07/09/13 2115 07/10/13 0049   07/09/13 1745  ceFEPIme (MAXIPIME) 2 g in dextrose 5 % 50 mL IVPB     2 g 100 mL/hr over 30 Minutes Intravenous  Once 07/09/13 1733 07/09/13 1933   07/09/13 1745  vancomycin (VANCOCIN) IVPB 1000 mg/200 mL premix     1,000 mg 200 mL/hr over 60 Minutes Intravenous  Once 07/09/13 1733 07/09/13 2122     Assessment: 32 yom presented to the ED from home hospice with productive cough and hypotension. SrCr improved today Goal of Therapy:  Eradication of infection  Plan:  1. Change cefepime 1gm IV Q12H 2. F/u renal fxn, C&S, clinical status   Andrew Blasius Poteet 07/11/2013,8:57 AM

## 2013-07-11 NOTE — Progress Notes (Signed)
Arrived in patient's room to do a schedule trach check and his sat was 85% and quickly decreased to 78% on 28% ATC.  Increased to 98% ATC and sat increased to 100%.  Bilateral Breath Sounds were decreased.  I tracheal suctioned patient:  No secretions.  RN notified and we repositioned patient in bed.  Decreased to 28% ATC and sat remained at 99%.

## 2013-07-11 NOTE — Progress Notes (Signed)
Patient requested to be tracheal suctioned: very small amount of white thin secretions.  Patient tolerated well: sat 99%, HR 66.

## 2013-07-11 NOTE — Progress Notes (Signed)
[facility] PROGRESS NOTE  [name] [name] VOH:[id number] DOB: January 18, 1941 DOA: 07/09/2013 PCP: [name], MD  Assessment/Plan: 73 y/o male with complicated medical history HTN, CAD h/o STEMI, CHF, A fib not on AC (h/o bleeding), CVA (esidual aphasia, chronic torticollis-like neck alignment and PEG placement)not ambulatory,  malnutrion s/p PEG, recurrent pneumonias on hospice care brought by hospice for hypotension found to have recurrent pneumonia likely aspiration    1. HCAP/sepsis/hypotensionm likely recurrent aspiration;  -cont IV atx, bronchodilators; prognosis is guarded; consult hospice care   2. Chronic respiratory failure s/p trach; cont inhalers as needed   3. CAD recent stemi; on medical management;    4. CHF chronic combines systolic +dyastolic HF; hold diuretics with sepsis; cant use ACE due to AKI   5. Acute kidney injury: hyper Na; Most likely secondary to sepsis+diuretics+ACE. Improving.   6. A fib cont BB, amiodarone; not on AC due to bleeding   D/w patient, his wife prognosis; patient is DNR; cont hospice care at home   Code Status: DNR Family Communication: d/w patient, his wife (indicate person spoken with, relationship, and if by phone, the number) Disposition Plan: hospice home    Consultants:  None   Procedures:  None   Antibiotics: 2/20 vanc  2/20 cefepime  2/20 azothro >>2/21  2/20 clinda>>>2/21   HPI/Subjective: Sleeping   Objective: Filed Vitals:   07/11/13 0843  BP:   Pulse: 64  Temp:   Resp: 18    Intake/Output Summary (Last 24 hours) at 07/11/13 1113 Last data filed at 07/11/13 0509  Gross per 24 hour  Intake 2501.25 ml  Output      2 ml  Net 2499.25 ml   Filed Weights   07/10/13 0100 07/10/13 0126 07/11/13 0509  Weight: 62.8 kg (138 lb 7.2 oz) 62.8 kg (138 lb 7.2 oz) 67.3 kg (148 lb 5.9 oz)    Exam:   General:  Sleeping   Cardiovascular: s1,s2 rrr  Respiratory: few crackles at bases   Abdomen: soft, nt+  PEG  Musculoskeletal: mild edema le   Data Reviewed: Basic Metabolic Panel:  Recent Labs Lab 07/09/13 1809 07/09/13 2157 07/10/13 0330 07/10/13 0950 07/11/13 0625  NA 161* 158* 158*  --  154*  K 4.0 3.8 3.5*  --  4.1  CL 118* 121* 122*  --  124*  CO2 26 23 23   --  18*  GLUCOSE 111* 150* 115*  --  166*  BUN 79* 73* 70*  --  50*  CREATININE 1.97* 1.71* 1.57*  --  1.11  CALCIUM 9.3 7.9* 7.7*  --  8.0*  MG  --   --   --  2.8*  --   PHOS  --   --   --  3.1  --    Liver Function Tests:  Recent Labs Lab 07/09/13 1809 07/09/13 2157  AST 159* 146*  ALT 355* 296*  ALKPHOS 194* 155*  BILITOT 0.4 0.3  PROT 8.0 6.5  ALBUMIN 2.3* 1.8*   No results found for this basename: LIPASE, AMYLASE,  in the last 168 hours No results found for this basename: AMMONIA,  in the last 168 hours CBC:  Recent Labs Lab 07/09/13 1809  WBC 14.4*  NEUTROABS 12.0*  HGB 10.3*  HCT 32.8*  MCV 103.1*  PLT 199   Cardiac Enzymes:  Recent Labs Lab 07/09/13 2158 07/10/13 0330  TROPONINI <0.30 <0.30   BNP (last 3 results)  Recent Labs  05/27/13 2115  PROBNP 889.8*   CBG:  Recent Labs Lab 07/10/13 2020 07/11/13 0021 07/11/13 0347 07/11/13 0455 07/11/13 0738  GLUCAP 110* 150* 157* 151* 168*    Recent Results (from the past 240 hour(s))  MRSA PCR SCREENING     Status: Abnormal   Collection Time    07/10/13  1:25 AM      Result Value Ref Range Status   MRSA by PCR POSITIVE (*) NEGATIVE Final   Comment:            The GeneXpert MRSA Assay (FDA     approved for NASAL specimens     only), is one component of a     comprehensive MRSA colonization     surveillance program. It is not     intended to diagnose MRSA     infection nor to guide or     monitor treatment for     MRSA infections.     RESULT CALLED TO, READ BACK BY AND VERIFIED WITH:     NOTIFIED C.FORCHA,RN 07/10/13 AT 0310 BY RHOLMES     Studies: Dg Abd 1 View  07/09/2013   CLINICAL DATA:  Mass.  EXAM: ABDOMEN -  1 VIEW  COMPARISON:  DG CHEST 1V PORT dated 07/09/2013; CT CHEST W/O CM dated 04/28/2013  FINDINGS: No bowel distention. The catheters noted of the mid abdomen. This may represent a gastrostomy catheter. Bowel loops displaced centrally. This suggests presence of ascites. No acute bony abnormality. Degenerative changes lumbar spine and both hips.  IMPRESSION: 1. Probable ascites.  No prominent bowel distention. 2. Catheter noted over the upper mid abdomen, this most likely a gastrostomy tube. No evidence of gastric distention or bowel distention.   Electronically Signed   By: [place]   On: 07/09/2013 23:44   US Abdomen Complete  07/10/2013   CLINICAL DATA:  Elevated LFTs. Question of left upper quadrant abdominal mass.  EXAM: ULTRASOUND ABDOMEN COMPLETE  COMPARISON:  None.  FINDINGS: Gallbladder:  Multiple large stones are seen filling the gallbladder, measuring up to 1.0 cm in size. The gallbladder wall is borderline prominent, without evidence of pericholecystic fluid. No ultrasonographic Murphy's sign is elicited.  Common bile duct:  Diameter: 0.6 cm, within normal limits in caliber.  Liver:  Mildly coarsened echotexture noted, raising question for mild fatty infiltration. No focal lesion identified.  IVC:  No abnormality visualized.  Pancreas:  Mild visualized due to overlying structures.  Spleen:  Size and appearance within normal limits.  Right Kidney:  Length: 13.0 cm. Diffusely increased parenchymal echogenicity noted. Multiple cysts are seen arising from the right kidney, measuring up to 4.9 x 4.4 x 4.2 cm.  Left Kidney:  Length: 12.0 cm. Diffusely increased parenchymal echogenicity is noted. Multiple cysts are seen arising from the left kidney, measuring up to 4.2 x 3.7 x 2.6 cm.  Abdominal aorta:  Mildly distended proximally, measuring up to 3.1 cm, raising concern for mild aneurysmal dilatation.  Other findings:  None.  IMPRESSION: 1. Cholelithiasis; gallbladder otherwise unremarkable in  appearance, without evidence for obstruction or cholecystitis. 2. Mildly coarsened hepatic echotexture raises question for mild fatty infiltration. 3. Diffusely increased renal cortical echogenicity raises concern for medical renal disease. 4. Bilateral renal cysts seen. 5. 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 6. No abdominal mass identified.   Electronically Signed   By: Garald Balding M.D.   On: 07/10/2013 01:14   Dg Chest Port 1 View  07/09/2013  CLINICAL DATA:  Hypotension. Chronic ventilator dependent respiratory failure.  EXAM: PORTABLE CHEST - 1 VIEW  COMPARISON:  DG CHEST 1V PORT dated 05/27/2013; CT CHEST W/O CM dated 04/28/2013; DG CHEST 1V PORT dated 04/21/2013; DG CHEST 1V PORT dated 04/21/2013  FINDINGS: Tracheostomy tube tip in satisfactory position below the thoracic inlet approximately 5 cm above carina. Thoracic scoliosis convex right. Cardiac silhouette enlarged but stable. Pulmonary vascularity normal without evidence of pulmonary edema. Right upper lobe lung nodule as noted previously. Consolidation in the left lower lobe. Lungs otherwise clear. Possible left pleural effusion.  IMPRESSION: 1. Left lower lobe pneumonia and possible left pleural effusion. 2. Stable cardiomegaly without pulmonary edema. 3. Right upper lobe pulmonary nodule as noted previously. 4. Tracheostomy tube satisfactory.   Electronically Signed   By: Evangeline Dakin M.D.   On: 07/09/2013 19:15    Scheduled Meds: . amiodarone  200 mg Oral Daily  . aspirin  81 mg Per Tube Daily  . ceFEPime (MAXIPIME) IV  1 g Intravenous Q12H  . Chlorhexidine Gluconate Cloth  6 each Topical Q0600  . cholecalciferol  1,000 Units Per Tube Daily  . feeding supplement (PRO-STAT SUGAR FREE 64)  30 mL Per Tube BID  . heparin  5,000 Units Subcutaneous 3 times per day  . mupirocin ointment  1 application Nasal BID  . oxybutynin  5 mg Per Tube Daily  . polyethylene  glycol  17 g Oral Daily   Continuous Infusions: . dextrose 75 mL/hr at 07/11/13 0915  . feeding supplement (JEVITY 1.2 CAL) 1,000 mL (07/10/13 1545)    Principal Problem:   Severe sepsis with acute organ dysfunction Active Problems:   CAD (coronary artery disease)   PAF (paroxysmal atrial fibrillation)   Hypotension   Acute kidney injury   Hypernatremia   Acute hepatitis   HCAP (healthcare-associated pneumonia)   Chronic systolic CHF (congestive heart failure)   Tracheostomy in place   Aspiration pneumonia   Abdominal mass   Hyperglycemia   Anemia    Time spent: >35 minutes     Kinnie Feil  [facility] Pager [phone]. If 7PM-7AM, please contact night-coverage at [email or web address], password [facility] 07/11/2013, 11:13 AM  LOS: 2 days

## 2013-07-12 DIAGNOSIS — E870 Hyperosmolality and hypernatremia: Secondary | ICD-10-CM

## 2013-07-12 DIAGNOSIS — I5022 Chronic systolic (congestive) heart failure: Secondary | ICD-10-CM

## 2013-07-12 DIAGNOSIS — I509 Heart failure, unspecified: Secondary | ICD-10-CM

## 2013-07-12 LAB — RESPIRATORY VIRUS PANEL
Adenovirus: NOT DETECTED
INFLUENZA A H1: NOT DETECTED
INFLUENZA A H3: NOT DETECTED
Influenza A: NOT DETECTED
Influenza B: NOT DETECTED
METAPNEUMOVIRUS: NOT DETECTED
PARAINFLUENZA 3 A: NOT DETECTED
Parainfluenza 1: NOT DETECTED
Parainfluenza 2: NOT DETECTED
RESPIRATORY SYNCYTIAL VIRUS A: NOT DETECTED
RHINOVIRUS: NOT DETECTED
Respiratory Syncytial Virus B: NOT DETECTED

## 2013-07-12 LAB — BASIC METABOLIC PANEL
BUN: 36 mg/dL — ABNORMAL HIGH (ref 6–23)
CHLORIDE: 123 meq/L — AB (ref 96–112)
CO2: 23 meq/L (ref 19–32)
Calcium: 8.3 mg/dL — ABNORMAL LOW (ref 8.4–10.5)
Creatinine, Ser: 0.92 mg/dL (ref 0.50–1.35)
GFR calc Af Amer: >90 mL/min (ref >90)
GFR calc non Af Amer: 83 mL/min — ABNORMAL LOW (ref >90)
Glucose, Bld: 134 mg/dL — ABNORMAL HIGH (ref 70–99)
Potassium: 3.7 mEq/L (ref 3.7–5.3)
Sodium: 159 mEq/L — ABNORMAL HIGH (ref 137–147)

## 2013-07-12 NOTE — Progress Notes (Signed)
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 HCAP/sepsis/hypotensionm likely recurrent aspiration;  -cont IV atx, bronchodilators; prognosis is guarded; consult hospice care   2. Chronic respiratory failure s/p trach; cont inhalers as needed   3. CAD recent stemi; on medical management;    4. CHF chronic combines systolic +dyastolic HF; hold diuretics with sepsis; cant use ACE due to AKI   5. Acute kidney injury: hyper Na; Most likely secondary to sepsis+diuretics+ACE. Improving.   6. A fib cont BB, amiodarone; not on AC due to bleeding   D/w patient, his wife prognosis; patient is DNR; cont hospice care at home   Plan home hospice likely on 2/24;   Code Status: DNR Family Communication: d/w patient, his wife (indicate person spoken with, relationship, and if by phone, the number) Disposition Plan: hospice home    Consultants:  None   Procedures:  None   Antibiotics: 2/20 vanc  2/20 cefepime  2/20 azothro >>2/21  2/20 clinda>>>2/21   HPI/Subjective: Sleeping   Objective: Filed Vitals:   07/12/13 0751  BP:   Pulse: 72  Temp:   Resp: 16    Intake/Output Summary (Last 24 hours) at 07/12/13 0941 Last data filed at 07/11/13 1800  Gross per 24 hour  Intake 1557.58 ml  Output      0 ml  Net 1557.58 ml   Filed Weights   07/10/13 0126 07/11/13 0509 07/12/13 0502  Weight: 62.8 kg (138 lb 7.2 oz) 67.3 kg (148 lb 5.9 oz) 71.2 kg (156 lb 15.5 oz)    Exam:   General:  Sleeping   Cardiovascular: s1,s2 rrr  Respiratory: few  crackles at bases   Abdomen: soft, nt+ PEG  Musculoskeletal: mild edema le   Data Reviewed: Basic Metabolic Panel:  Recent Labs Lab 07/09/13 1809 07/09/13 2157 07/10/13 0330 07/10/13 0950 07/11/13 0625  NA 161* 158* 158*  --  154*  K 4.0 3.8 3.5*  --  4.1  CL 118* 121* 122*  --  124*  CO2 26 23 23   --  18*  GLUCOSE 111* 150* 115*  --  166*  BUN 79* 73* 70*  --  50*  CREATININE 1.97* 1.71* 1.57*  --  1.11  CALCIUM 9.3 7.9* 7.7*  --  8.0*  MG  --   --   --  2.8*  --   PHOS  --   --   --  3.1  --    Liver Function Tests:  Recent Labs Lab 07/09/13 1809 07/09/13 2157  AST 159* 146*  ALT 355* 296*  ALKPHOS 194* 155*  BILITOT 0.4 0.3  PROT 8.0 6.5  ALBUMIN 2.3* 1.8*   No results found for this basename: LIPASE, AMYLASE,  in the last 168 hours No results found for this basename: AMMONIA,  in the last 168 hours CBC:  Recent Labs Lab 07/09/13 1809  WBC 14.4*  NEUTROABS 12.0*  HGB 10.3*  HCT 32.8*  MCV 103.1*  PLT 199   Cardiac Enzymes:  Recent Labs Lab 07/09/13 2158 07/10/13 0330  TROPONINI <0.30 <0.30   BNP (last 3 results)  Recent Labs  05/27/13 2115  PROBNP 889.8*   CBG:  Recent Labs Lab 07/10/13 2020 07/11/13 0021 07/11/13 0347 07/11/13 0455 07/11/13 0738  GLUCAP 110* 150* 157* 151* 168*    Recent Results (from the past 240 hour(s))  CULTURE, BLOOD (ROUTINE X 2)     Status: None   Collection Time    07/09/13  6:04 PM      Result Value Ref Range Status   Specimen Description BLOOD FOREARM LEFT   Final   Special Requests BOTTLES DRAWN AEROBIC AND ANAEROBIC 4CC   Final   Culture  Setup Time     Final   Value: 07/10/2013 00:55     Performed at [facility]   Culture     Final   Value:        BLOOD CULTURE RECEIVED NO GROWTH TO DATE CULTURE WILL BE HELD FOR 5 DAYS BEFORE ISSUING A FINAL NEGATIVE REPORT     Performed at [facility]   Report Status PENDING   Incomplete  CULTURE, BLOOD (ROUTINE X 2)     Status: None    Collection Time    07/09/13  6:09 PM      Result Value Ref Range Status   Specimen Description BLOOD FOREARM LEFT   Final   Special Requests BOTTLES DRAWN AEROBIC AND ANAEROBIC 8CC   Final   Culture  Setup Time     Final   Value: 07/10/2013 00:56     Performed at [facility]   Culture     Final   Value:        BLOOD CULTURE RECEIVED NO GROWTH TO DATE CULTURE WILL BE HELD FOR 5 DAYS BEFORE ISSUING A FINAL NEGATIVE REPORT     Performed at [facility]   Report Status PENDING   Incomplete  URINE CULTURE     Status: None   Collection Time    07/09/13  9:28 PM      Result Value Ref Range Status   Specimen Description URINE, CATHETERIZED   Final   Special Requests NONE   Final   Culture  Setup Time     Final   Value: 07/10/2013 12:14     Performed at [place]     Final   Value: NO GROWTH     Performed at [facility]   Culture     Final   Value: NO GROWTH     Performed at [facility]   Report Status 07/11/2013 FINAL   Final  MRSA PCR SCREENING     Status: Abnormal   Collection Time    07/10/13  1:25 AM      Result Value Ref Range Status   MRSA by PCR POSITIVE (*) NEGATIVE Final   Comment:            The GeneXpert MRSA Assay (FDA     approved for NASAL specimens     only), is one component of a     comprehensive MRSA colonization     surveillance program. It is not     intended to diagnose MRSA     infection nor to guide or     monitor treatment for     MRSA infections.     RESULT CALLED TO, READ BACK BY AND VERIFIED WITH:     NOTIFIED C.FORCHA,RN 07/10/13 AT 0310 BY RHOLMES     Studies: No results found.  Scheduled Meds: . amiodarone  200  mg Oral Daily  . aspirin  81 mg Per Tube Daily  . carvedilol  3.125 mg Oral BID WC  . ceFEPime (MAXIPIME) IV  1 g Intravenous Q12H  . Chlorhexidine Gluconate Cloth  6 each Topical Q0600  . cholecalciferol  1,000 Units Per Tube Daily  . feeding supplement (PRO-STAT SUGAR  FREE 64)  30 mL Per Tube BID  . heparin  5,000 Units Subcutaneous 3 times per day  . mupirocin ointment  1 application Nasal BID  . oxybutynin  5 mg Per Tube Daily  . polyethylene glycol  17 g Oral Daily   Continuous Infusions: . dextrose 50 mL/hr at 07/12/13 0700  . feeding supplement (JEVITY 1.2 CAL) 1,000 mL (07/11/13 1755)    Principal Problem:   Severe sepsis with acute organ dysfunction Active Problems:   CAD (coronary artery disease)   PAF (paroxysmal atrial fibrillation)   Hypotension   Acute kidney injury   Hypernatremia   Acute hepatitis   HCAP (healthcare-associated pneumonia)   Chronic systolic CHF (congestive heart failure)   Tracheostomy in place   Aspiration pneumonia   Abdominal mass   Hyperglycemia   Anemia    Time spent: >35 minutes     Kinnie Feil  [facility] Pager [phone]. If 7PM-7AM, please contact night-coverage at [email or web address], password [facility] 07/12/2013, 9:41 AM  LOS: 3 days

## 2013-07-12 NOTE — Care Management Note (Signed)
  Page 1 of 1   07/12/2013     10:23:09 AM   CARE MANAGEMENT NOTE 07/12/2013  Patient:  [name] [name]   Account Number:  [id number]  Date Initiated:  07/12/2013  Documentation initiated by:  Magdalen Spatz  Subjective/Objective Assessment:     Action/Plan:   Anticipated DC Date:  07/13/2013   Anticipated DC Plan:  HOME W HOSPICE CARE         Choice offered to / List presented to:             Status of service:   Medicare Important Message given?   (If response is "NO", the following Medicare IM given date fields will be blank) Date Medicare IM given:   Date Additional Medicare IM given:    Discharge Disposition:    Per UR Regulation:    If discussed at Long Length of Stay Meetings, dates discussed:    Comments: 07-12-13 89  Called wife again and left message . Awaiting call back . Magdalen Spatz RN BSN      07-12-13 Wife not at bedside at present . Called left voice mail regarding discharge planning for 07-13-13 . Awaiting call back. Magdalen Spatz RN BSN [phone]

## 2013-07-12 NOTE — Progress Notes (Signed)
NUTRITION FOLLOW UP  Intervention:   TF via PEG tube with Jevity 1.2 at goal rate of 55 ml/h with 30 ml Pro-stat BID to provide 1784 kcals, 103 gm protein, 1065 ml free water daily. 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 Noted to be in shock from likely pulmonary source with BP in the 80's/50's.  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 In addition, pt consumes meals and snacks throughout the day.   Pt remains NPO and continues to receive Jevity 1.2 @ 55 ml/hr; this provides 105 % of minimum estimated energy needs and 108% of minimum estimated protein needs. Pt tolerating with 0 ml residuals per nursing notes.  Pt's weight has increased 18 lbs since 2/21. Question accuracy of admission weight.   Labs: High sodium, elevated BUN, elevated magnesium  Height: Ht Readings from Last 1 Encounters:  07/10/13 5' 8"  (1.727 m)    Weight Status:   Wt Readings from Last 1 Encounters:  07/12/13 156 lb 15.5 oz (71.2 kg)  07/10/13 138 lb 04/30/13 160 lb  Re-estimated needs:  Kcal: 1700-1900  Protein: 95-105 gm  Fluid: 1.7-1.9 L  Skin: stage 2 pressure ulcer on perineum  Diet Order: NPO   Intake/Output Summary (Last 24 hours) at 07/12/13 1502 Last  data filed at 07/12/13 1400  Gross per 24 hour  Intake    905 ml  Output      0 ml  Net    905 ml    Last BM: 2/21   Labs:   Recent Labs Lab 07/10/13 0330 07/10/13 0950 07/11/13 0625 07/12/13 0925  NA 158*  --  154* 159*  K 3.5*  --  4.1 3.7  CL 122*  --  124* 123*  CO2 23  --  18* 23  BUN 70*  --  50* 36*  CREATININE 1.57*  --  1.11 0.92  CALCIUM 7.7*  --  8.0* 8.3*  MG  --  2.8*  --   --   PHOS  --  3.1  --   --   GLUCOSE 115*  --  166* 134*    CBG (last 3)   Recent Labs  07/11/13 0347 07/11/13 0455 07/11/13 0738  GLUCAP 157* 151* 168*    Scheduled Meds: . amiodarone  200 mg Oral Daily  . aspirin  81 mg Per Tube Daily  . carvedilol  3.125 mg Oral BID WC  . ceFEPime (MAXIPIME) IV  1 g Intravenous Q12H  . Chlorhexidine Gluconate Cloth  6 each Topical Q0600  . cholecalciferol  1,000 Units Per Tube Daily  . feeding supplement (PRO-STAT SUGAR FREE 64)  30 mL Per Tube BID  . heparin  5,000 Units Subcutaneous 3 times per  day  . mupirocin ointment  1 application Nasal BID  . oxybutynin  5 mg Per Tube Daily  . polyethylene glycol  17 g Oral Daily    Continuous Infusions: . feeding supplement (JEVITY 1.2 CAL) 1,000 mL (07/12/13 1024)    Pryor Ochoa RD, LDN Inpatient Clinical Dietitian Pager: [phone] After Hours Pager: [phone]

## 2013-07-13 DIAGNOSIS — I10 Essential (primary) hypertension: Secondary | ICD-10-CM

## 2013-07-13 DIAGNOSIS — J690 Pneumonitis due to inhalation of food and vomit: Secondary | ICD-10-CM

## 2013-07-13 MED ORDER — AMOXICILLIN-POT CLAVULANATE 875-125 MG PO TABS
1.0000 | ORAL_TABLET | Freq: Two times a day (BID) | ORAL | Status: AC
Start: 2013-07-13 — End: ?

## 2013-07-13 MED ORDER — DOXYCYCLINE HYCLATE 100 MG PO TABS: 100.0000 mg | ORAL_TABLET | Freq: Two times a day (BID) | ORAL | Status: AC

## 2013-07-13 MED ORDER — FUROSEMIDE 40 MG PO TABS: 20.0000 mg | ORAL_TABLET | Freq: Every day | ORAL | Status: AC

## 2013-07-13 MED ORDER — PRO-STAT SUGAR FREE PO LIQD: 30.0000 mL | Freq: Two times a day (BID) | ORAL | Status: AC

## 2013-07-13 NOTE — Care Management Note (Addendum)
07-13-13 Wife called back states she has Levaquin 500  Mg PO daily at home . She can not get new antibiotic prescriptions filled until Friday . She wants to know if it is OK to give her husband Levaquin until Friday . DR Daleen Bo agreed with this . Called Mrs Marston back to let her know. Magdalen Spatz RN BS [phone]   07-13-13 1425 Patient's wife returned call . She is aware of [place] and Hospice being closed today . As patient was home with hospice prior to admission she is comfort with trach , and tube feeding and states she has lots of supplies incase Hospice is closed rest of week. Magdalen Spatz RN BSN [phone]    07-13-13 Tried to fax information to [facility] , no answer on their end . Magdalen Spatz RN BSN [phone]    07-13-13 [place] again , they are closed and have answering service ( one nurse on call ) . Left another message . Called patient's wife left voice mail explaining situation and asking her to return my call. Paged MD.  Magdalen Spatz RN BSN [phone]    07-13-13 [place] again . Awaiting call back. Magdalen Spatz RN BSN [phone]    07-13-13 Patient's wife at bedside . Hospice Agency is [facility] phone [phone] , fax [phone] . Called left voice mail awaiting call back.   Patient's wife is requesting ambulance transportation home home for patient. Confirmed face sheet information with wife.   Magdalen Spatz RN BSN [phone]

## 2013-07-13 NOTE — Discharge Summary (Signed)
Physician Discharge Summary  [name] [name] TWS:[id number] DOB: November 06, 1940 DOA: 07/09/2013  PCP: [name], MD  Admit date: 07/09/2013 Discharge date: 07/13/2013  Time spent: >35 minutes  Recommendations for Outpatient Follow-up:  Hospice care  Discharge Diagnoses:  Principal Problem:   Severe sepsis with acute organ dysfunction Active Problems:   CAD (coronary artery disease)   PAF (paroxysmal atrial fibrillation)   Hypotension   Acute kidney injury   Hypernatremia   Acute hepatitis   HCAP (healthcare-associated pneumonia)   Chronic systolic CHF (congestive heart failure)   Tracheostomy in place   Aspiration pneumonia   Abdominal mass   Hyperglycemia   Anemia   Discharge Condition: stable, but prognosis is poor   Diet recommendation: tube feeding   Filed Weights   07/11/13 0509 07/12/13 0502 07/13/13 0500  Weight: 67.3 kg (148 lb 5.9 oz) 71.2 kg (156 lb 15.5 oz) 71.1 kg (156 lb 12 oz)    History of present illness:  73 y/o male with complicated medical history HTN, CAD h/o STEMI, CHF, A fib not on AC (h/o bleeding), CVA (esidual aphasia, 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 HCAP/sepsis/hypotensionm likely recurrent aspiration;  -improved on IV atx, bronchodilators;  -prognosis is still poor due recurrent pneumonia, aspirations; d/w patient, his wife they would like to continue hospice care at home  2. Chronic respiratory failure s/p trach; cont inhalers as needed  3. CAD recent stemi; on medical management;  4. CHF chronic combines systolic +dyastolic HF; cant use ACE due to AKI resume low dose diuretics  5. Acute kidney injury: hyper Na; Most likely secondary to sepsis+diuretics+ACE. Improving. Cont tube feeding with water flash  6. A fib cont BB, amiodarone; not  on AC due to bleeding   D/w patient, his wife prognosis; patient is DNR;  -cont hospice care at home      Procedures:  None  (i.e. Studies not automatically included, echos, thoracentesis, etc; not x-rays)  Consultations:  None   Discharge Exam: Filed Vitals:   07/13/13 0752  BP:   Pulse: 67  Temp:   Resp: 18    General: alert Cardiovascular: s1,s2 rrr Respiratory: few crackles in bases   Discharge Instructions  Discharge Orders   Future Orders Complete By Expires   Diet - low sodium heart healthy  As directed    Discharge instructions  As directed    Comments:     Please follow up with hospice care   Increase activity slowly  As directed        Medication List    STOP taking these medications       lisinopril 20 MG tablet  Commonly known as:  PRINIVIL,ZESTRIL      TAKE these medications       amiodarone 200 MG tablet  Commonly known as:  PACERONE  200 mg by Feeding Tube route daily.     amoxicillin-clavulanate 875-125 MG per tablet  Commonly known as:  AUGMENTIN  Take 1 tablet by mouth 2 (two) times daily.     aspirin 81 MG tablet  Place 1 tablet (81 mg total) into feeding tube daily.     carvedilol 25 MG tablet  Commonly known as:  COREG  Place 1 tablet (25 mg total) into feeding tube 2 (two) times daily.     Cholecalciferol 1000 UNITS capsule  Place 1,000  Units into feeding tube daily.     doxycycline 100 MG tablet  Commonly known as:  VIBRA-TABS  Take 1 tablet (100 mg total) by mouth 2 (two) times daily.     feeding supplement (PRO-STAT SUGAR FREE 64) Liqd  Place 30 mLs into feeding tube 2 (two) times daily.     furosemide 40 MG tablet  Commonly known as:  LASIX  Take 0.5 tablets (20 mg total) by mouth daily.     hydrALAZINE 25 MG tablet  Commonly known as:  APRESOLINE  Place 1 tablet (25 mg total) into feeding tube every 8 (eight) hours.     oxybutynin 5 MG tablet  Commonly known as:  DITROPAN  Place 5 mg into feeding tube  daily.     potassium chloride 10 MEQ tablet  Commonly known as:  K-DUR  Place 20 mEq into feeding tube daily.     simvastatin 40 MG tablet  Commonly known as:  ZOCOR  Place 0.5 tablets (20 mg total) into feeding tube daily.     VITAMIN K PO  1,000 Units by Feeding Tube route daily.       No Known Allergies    The results of significant diagnostics from this hospitalization (including imaging, microbiology, ancillary and laboratory) are listed below for reference.    Significant Diagnostic Studies: Dg Abd 1 View  07/09/2013   CLINICAL DATA:  Mass.  EXAM: ABDOMEN - 1 VIEW  COMPARISON:  DG CHEST 1V PORT dated 07/09/2013; CT CHEST W/O CM dated 04/28/2013  FINDINGS: No bowel distention. The catheters noted of the mid abdomen. This may represent a gastrostomy catheter. Bowel loops displaced centrally. This suggests presence of ascites. No acute bony abnormality. Degenerative changes lumbar spine and both hips.  IMPRESSION: 1. Probable ascites.  No prominent bowel distention. 2. Catheter noted over the upper mid abdomen, this most likely a gastrostomy tube. No evidence of gastric distention or bowel distention.   Electronically Signed   By: [place]   On: 07/09/2013 23:44   US Abdomen Complete  07/10/2013   CLINICAL DATA:  Elevated LFTs. Question of left upper quadrant abdominal mass.  EXAM: ULTRASOUND ABDOMEN COMPLETE  COMPARISON:  None.  FINDINGS: Gallbladder:  Multiple large stones are seen filling the gallbladder, measuring up to 1.0 cm in size. The gallbladder wall is borderline prominent, without evidence of pericholecystic fluid. No ultrasonographic Murphy's sign is elicited.  Common bile duct:  Diameter: 0.6 cm, within normal limits in caliber.  Liver:  Mildly coarsened echotexture noted, raising question for mild fatty infiltration. No focal lesion identified.  IVC:  No abnormality visualized.  Pancreas:  Mild visualized due to overlying structures.  Spleen:  Size and appearance  within normal limits.  Right Kidney:  Length: 13.0 cm. Diffusely increased parenchymal echogenicity noted. Multiple cysts are seen arising from the right kidney, measuring up to 4.9 x 4.4 x 4.2 cm.  Left Kidney:  Length: 12.0 cm. Diffusely increased parenchymal echogenicity is noted. Multiple cysts are seen arising from the left kidney, measuring up to 4.2 x 3.7 x 2.6 cm.  Abdominal aorta:  Mildly distended proximally, measuring up to 3.1 cm, raising concern for mild aneurysmal dilatation.  Other findings:  None.  IMPRESSION: 1. Cholelithiasis; gallbladder otherwise unremarkable in appearance, without evidence for obstruction or cholecystitis. 2. Mildly coarsened hepatic echotexture raises question for mild fatty infiltration. 3. Diffusely increased renal cortical echogenicity raises concern for medical renal disease. 4. Bilateral renal cysts seen. 5. 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 6. No abdominal mass identified.   Electronically Signed   By: Garald Balding M.D.   On: 07/10/2013 01:14   Dg Chest Port 1 View  07/09/2013   CLINICAL DATA:  Hypotension. Chronic ventilator dependent respiratory failure.  EXAM: PORTABLE CHEST - 1 VIEW  COMPARISON:  DG CHEST 1V PORT dated 05/27/2013; CT CHEST W/O CM dated 04/28/2013; DG CHEST 1V PORT dated 04/21/2013; DG CHEST 1V PORT dated 04/21/2013  FINDINGS: Tracheostomy tube tip in satisfactory position below the thoracic inlet approximately 5 cm above carina. Thoracic scoliosis convex right. Cardiac silhouette enlarged but stable. Pulmonary vascularity normal without evidence of pulmonary edema. Right upper lobe lung nodule as noted previously. Consolidation in the left lower lobe. Lungs otherwise clear. Possible left pleural effusion.  IMPRESSION: 1. Left lower lobe pneumonia and possible left pleural effusion. 2. Stable cardiomegaly without pulmonary edema. 3. Right upper lobe pulmonary nodule as  noted previously. 4. Tracheostomy tube satisfactory.   Electronically Signed   By: Evangeline Dakin M.D.   On: 07/09/2013 19:15    Microbiology: Recent Results (from the past 240 hour(s))  CULTURE, BLOOD (ROUTINE X 2)     Status: None   Collection Time    07/09/13  6:04 PM      Result Value Ref Range Status   Specimen Description BLOOD FOREARM LEFT   Final   Special Requests BOTTLES DRAWN AEROBIC AND ANAEROBIC 4CC   Final   Culture  Setup Time     Final   Value: 07/10/2013 00:55     Performed at [facility]   Culture     Final   Value:        BLOOD CULTURE RECEIVED NO GROWTH TO DATE CULTURE WILL BE HELD FOR 5 DAYS BEFORE ISSUING A FINAL NEGATIVE REPORT     Performed at [facility]   Report Status PENDING   Incomplete  CULTURE, BLOOD (ROUTINE X 2)     Status: None   Collection Time    07/09/13  6:09 PM      Result Value Ref Range Status   Specimen Description BLOOD FOREARM LEFT   Final   Special Requests BOTTLES DRAWN AEROBIC AND ANAEROBIC 8CC   Final   Culture  Setup Time     Final   Value: 07/10/2013 00:56     Performed at [facility]   Culture     Final   Value:        BLOOD CULTURE RECEIVED NO GROWTH TO DATE CULTURE WILL BE HELD FOR 5 DAYS BEFORE ISSUING A FINAL NEGATIVE REPORT     Performed at [facility]   Report Status PENDING   Incomplete  URINE CULTURE     Status: None   Collection Time    07/09/13  9:28 PM      Result Value Ref Range Status   Specimen Description URINE, CATHETERIZED   Final   Special Requests NONE   Final   Culture  Setup Time     Final   Value: 07/10/2013 12:14     Performed at [facility]     Final   Value: NO GROWTH     Performed at [facility]   Culture     Final   Value: NO GROWTH     Performed at [facility]   Report Status 07/11/2013 FINAL  Final  MRSA PCR SCREENING     Status: Abnormal   Collection Time    07/10/13  1:25 AM      Result Value Ref Range  Status   MRSA by PCR POSITIVE (*) NEGATIVE Final   Comment:            The GeneXpert MRSA Assay (FDA     approved for NASAL specimens     only), is one component of a     comprehensive MRSA colonization     surveillance program. It is not     intended to diagnose MRSA     infection nor to guide or     monitor treatment for     MRSA infections.     RESULT CALLED TO, READ BACK BY AND VERIFIED WITH:     NOTIFIED C.FORCHA,RN 07/10/13 AT 0310 BY RHOLMES  RESPIRATORY VIRUS PANEL     Status: None   Collection Time    07/10/13  1:28 AM      Result Value Ref Range Status   Source - RVPAN NASAL SWAB   Corrected   Comment: CORRECTED ON 02/23 AT 1927: PREVIOUSLY REPORTED AS NASAL SWAB   Respiratory Syncytial Virus A NOT DETECTED   Final   Respiratory Syncytial Virus B NOT DETECTED   Final   Influenza A NOT DETECTED   Final   Influenza B NOT DETECTED   Final   Parainfluenza 1 NOT DETECTED   Final   Parainfluenza 2 NOT DETECTED   Final   Parainfluenza 3 NOT DETECTED   Final   Metapneumovirus NOT DETECTED   Final   Rhinovirus NOT DETECTED   Final   Adenovirus NOT DETECTED   Final   Influenza A H1 NOT DETECTED   Final   Influenza A H3 NOT DETECTED   Final   Comment: (NOTE)           Normal Reference Range for each Analyte: NOT DETECTED     Testing performed using the Luminex xTAG Respiratory Viral Panel test     kit.     This test was developed and its performance characteristics determined     by [facility]. It has not been cleared or approved by the [place]     Food and Drug Administration. This test is used for clinical purposes.     It should not be regarded as investigational or for research. This     laboratory is certified under the [place] (CLIA) as qualified to perform high complexity     clinical laboratory testing.     Performed at [facility], RESPIRATORY (NON-EXPECTORATED)     Status: None   Collection Time     07/12/13  5:13 PM      Result Value Ref Range Status   Specimen Description TRACHEAL ASPIRATE   Final   Special Requests NONE   Final   Gram Stain     Final   Value: MODERATE WBC PRESENT,BOTH PMN AND MONONUCLEAR     NO SQUAMOUS EPITHELIAL CELLS SEEN     RARE GRAM POSITIVE COCCI     IN PAIRS IN CLUSTERS     Performed at [facility]   Culture     Final   Value: Culture reincubated for better growth     Performed at [facility]   Report Status PENDING   Incomplete     Labs: Basic Metabolic Panel:  Recent Labs Lab 07/09/13 1809 07/09/13 2157 07/10/13 0330 07/10/13 0950 07/11/13 0625 07/12/13 0925  NA 161* 158* 158*  --  154* 159*  K 4.0 3.8 3.5*  --  4.1 3.7  CL 118* 121* 122*  --  124* 123*  CO2 26 23 23   --  18* 23  GLUCOSE 111* 150* 115*  --  166* 134*  BUN 79* 73* 70*  --  50* 36*  CREATININE 1.97* 1.71* 1.57*  --  1.11 0.92  CALCIUM 9.3 7.9* 7.7*  --  8.0* 8.3*  MG  --   --   --  2.8*  --   --   PHOS  --   --   --  3.1  --   --    Liver Function Tests:  Recent Labs Lab 07/09/13 1809 07/09/13 2157  AST 159* 146*  ALT 355* 296*  ALKPHOS 194* 155*  BILITOT 0.4 0.3  PROT 8.0 6.5  ALBUMIN 2.3* 1.8*   No results found for this basename: LIPASE, AMYLASE,  in the last 168 hours No results found for this basename: AMMONIA,  in the last 168 hours CBC:  Recent Labs Lab 07/09/13 1809  WBC 14.4*  NEUTROABS 12.0*  HGB 10.3*  HCT 32.8*  MCV 103.1*  PLT 199   Cardiac Enzymes:  Recent Labs Lab 07/09/13 2158 07/10/13 0330  TROPONINI <0.30 <0.30   BNP: BNP (last 3 results)  Recent Labs  05/27/13 2115  PROBNP 889.8*   CBG:  Recent Labs Lab 07/10/13 2020 07/11/13 0021 07/11/13 0347 07/11/13 0455 07/11/13 0738  GLUCAP 110* 150* 157* 151* 168*       Signed:  Christien Frankl N  [facility] 07/13/2013, 10:00 AM

## 2013-07-14 LAB — CULTURE, RESPIRATORY W GRAM STAIN: Culture: NORMAL

## 2013-07-14 LAB — CULTURE, RESPIRATORY

## 2013-07-16 LAB — CULTURE, BLOOD (ROUTINE X 2)
Culture: NO GROWTH
Culture: NO GROWTH

## 2014-04-28 ENCOUNTER — Encounter (HOSPITAL_COMMUNITY): Payer: Self-pay | Admitting: Cardiology

## 2014-06-09 IMAGING — CR DG CHEST 1V PORT
2 series · 2 of 2 positions shown · non-contrast
Comparison: 04/21/2013 and CT 04/28/2013

CLINICAL DATA: Shortness of breath.

EXAM:
PORTABLE CHEST - 1 VIEW

[AP (1 of 2)]
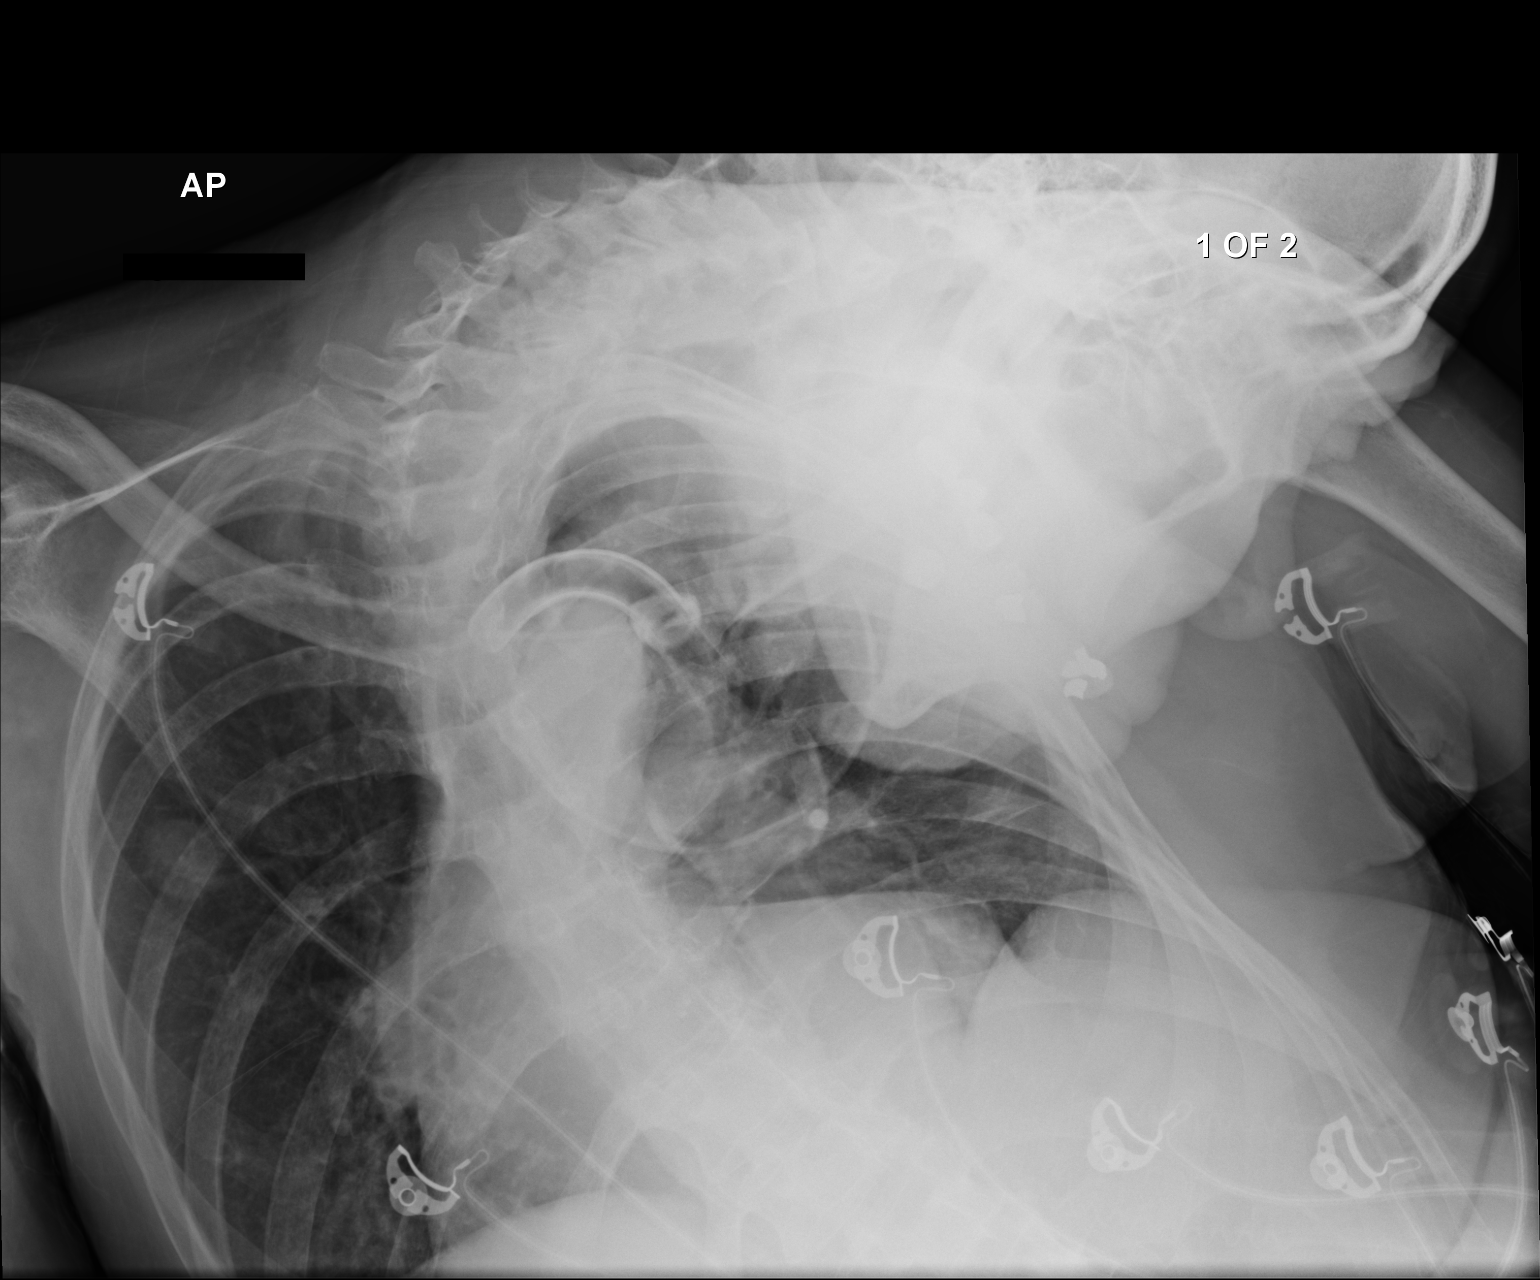

[AP (2 of 2)]
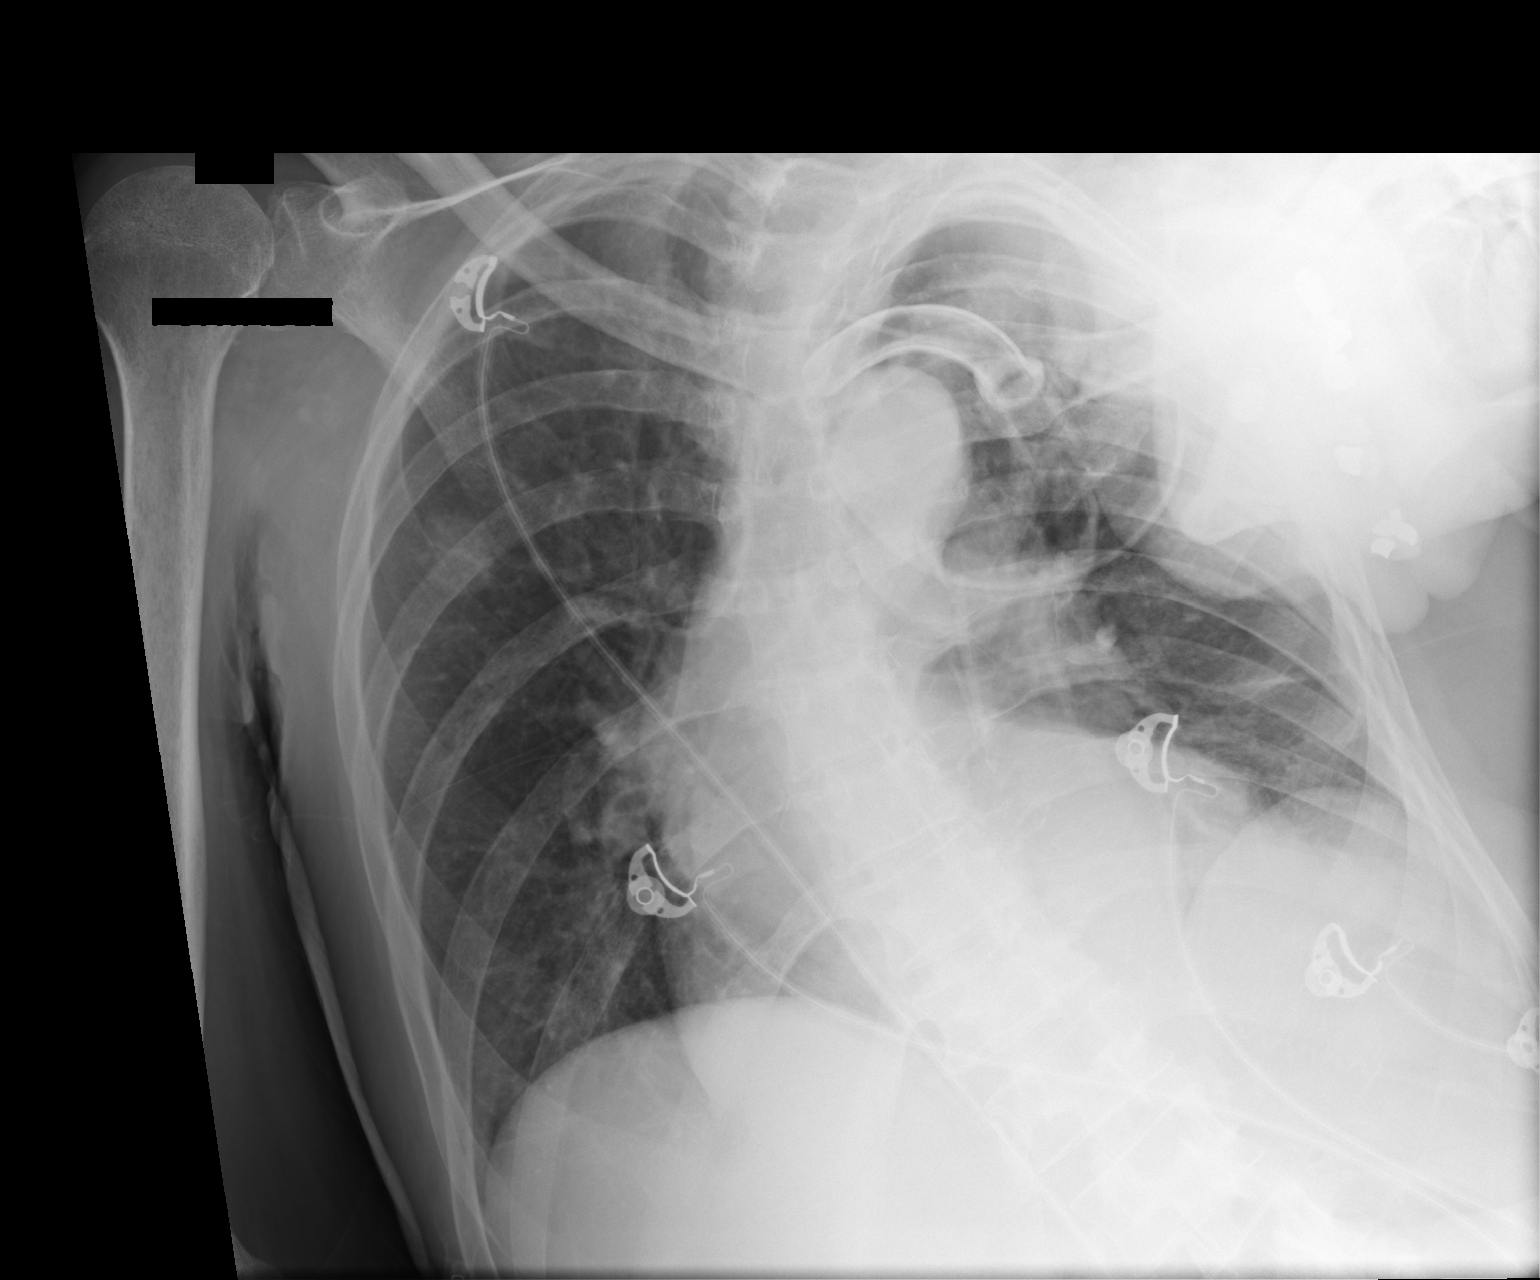

[2 of 2 positions shown; findings below may reference images not displayed]

FINDINGS: The neck is flexed and the head is overlying the left side of the
chest. There is a nodular density in the right lung, roughly
measuring 2.4 cm and this corresponds with the nodule identified on
the CT from 04/28/2013. A tracheostomy tube is present. Densities at
the left lung base could represent atelectasis. Limited evaluation
of the left upper lung. Heart size is within normal limits. There is
improved aeration at the left lung base compared to the previous
radiograph.
IMPRESSION: Persistent nodule in the right upper lung. This measures close to
2.4 cm. Findings remain concerning for a neoplasm. Consider further
evaluation with a PET-CT.

Left basilar densities could represent atelectasis versus mild
airspace disease.

## 2014-06-20 DEATH — deceased

## 2014-07-22 IMAGING — CR DG CHEST 1V PORT
1 series · 1 of 1 positions shown · non-contrast
Comparison: DG CHEST 1V PORT dated 05/27/2013;

CLINICAL DATA: Hypotension. Chronic ventilator dependent
respiratory failure.

EXAM:
PORTABLE CHEST - 1 VIEW

[AP]
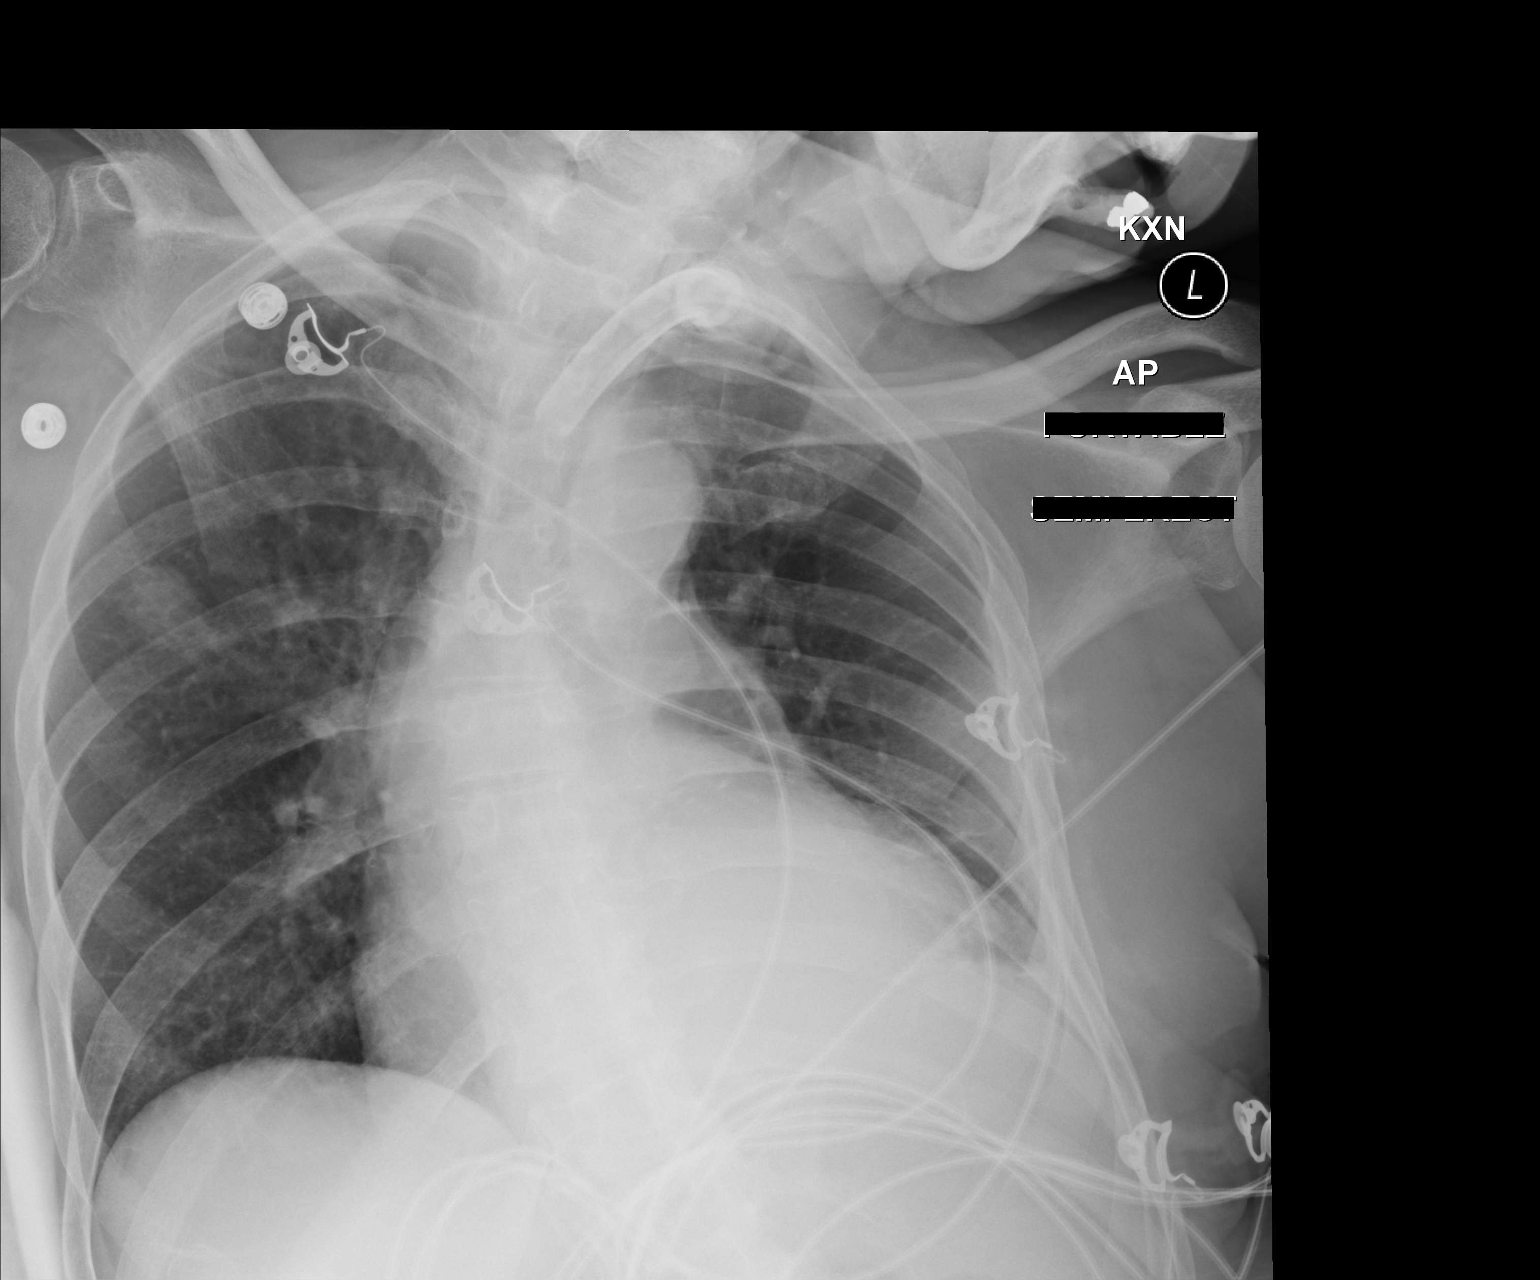

[1 of 1 positions shown; findings below may reference images not displayed]

CT CHEST W/O CM dated
04/28/2013; DG CHEST 1V PORT dated 04/21/2013; DG CHEST 1V PORT dated
04/21/2013
FINDINGS: Tracheostomy tube tip in satisfactory position below the thoracic
inlet approximately 5 cm above carina. Thoracic scoliosis convex
right. Cardiac silhouette enlarged but stable. Pulmonary vascularity
normal without evidence of pulmonary edema. Right upper lobe lung
nodule as noted previously. Consolidation in the left lower lobe.
Lungs otherwise clear. Possible left pleural effusion.
IMPRESSION: 1. Left lower lobe pneumonia and possible left pleural effusion.
2. Stable cardiomegaly without pulmonary edema.
3. Right upper lobe pulmonary nodule as noted previously.
4. Tracheostomy tube satisfactory.

## 2014-09-11 NOTE — Discharge Summary (Signed)
PATIENT NAME:  [name] [name], [name] [name] MR#:  [id number] DATE OF BIRTH:  11-05-1940  DATE OF ADMISSION:  10/30/2011 DATE OF DISCHARGE:  11/01/2011  ADMITTING DIAGNOSIS: Fever.   DISCHARGE DIAGNOSES:  1. Fever due to pneumonia as well as urinary tract infection  2. Pneumonia, possible aspiration or community-acquired pneumonia. The patient underwent swallow evaluation. Changes in his diet recommended.  3. Urinary tract infection with Pseudomonas sensitive to Cipro and Levaquin. He will be on Levaquin.  4. Accelerated hypertension, labile during the hospitalization. The patient's atenolol is increased.  5. History of cerebrovascular accident, on aspirin.  6. Chronic dysphagia, has PEG tube. The patient gets tube feeds as well as eats at home.  7. Hyperlipidemia.  8. Elevated troponin without any cardiac chest pain. EKG was nondiagnostic. Echo showed no significant wall motion abnormality. Due to the patient's chronic issues, he is not a candidate for any intervention. 9. Electrolyte imbalances, status post replacement.  10. Previous history of transient ischemic attack.   CONSULTANT: Speech   LABS AND EVALUATIONS: EKG showed sinus rhythm with 84, PVCs, T wave inversions in lateral leads.   Chest x-ray showed mild opacities in the mid and lower left lung, questionable atelectasis versus pneumonia. Also showed small nodular density in the right lateral mid lung zones.   WBC count 15, hemoglobin 14, platelet count 102, potassium 3.3, creatinine 1, BUN 15, glucose 130, calcium 8.7. LFTs were normal. Urinalysis showed leukocyte esterase 3+, WBCs 107, positive bacteria. Urine culture subsequently showed Pseudomonas greater than 100,000 colonies sensitive to Levofloxacin and Cipro. Blood cultures no growth at 36 hours.   HOSPITAL COURSE: Please refer to history and physical done by the admitting physician. The patient is a 74 year old African American male with history of brain stem CVA who was brought to  the hospital by his wife with fevers with chills and rigors. The patient on the chest x-ray showed possible pneumonia. Also, his urinalysis was abnormal. The patient had not been recently hospitalized. He is bedbound, gets around in a wheelchair. He also has dysphagia due to a stroke. The patient was admitted and started on IV ceftriaxone and azithromycin with resolution of his fevers and his WBC count normalized. The patient's urine culture just came back suggesting possible Pseudomonas. It is sensitive to Levaquin which we will continue. The patient also had a swallow evaluation for possible aspiration pneumonia. It was recommended that thickener be added to his diet. The patient was also noted to have elevated troponin which could be due to underlying heart disease, could be just demand ischemia. Unfortunately, due to his stroke and multiple other medical problems he is not a candidate for any intervention. His echo did show a suppressed EF without any wall motion abnormality. At this time the patient is doing much better and is stable for discharge.   DISCHARGE MEDICATIONS:  1. Oxybutynin 5 mg 1 tab p.o. b.i.d.  2. Diazepam 5 mg daily.  3. Simvastatin 40 at bedtime.  4. Ramipril 10 daily.  5. Aspirin 81 mg 1 tab p.o. daily.  6. Levaquin 500 down the PEG daily x7 days. 7. Atenolol 100 mg down the PEG daily.   HOME OXYGEN: None.   DIET: Resume tube feeds as doing previously as well as dysphagia nectar thick diet.   ACTIVITY: Bedbound.  FOLLOW-UP: Follow-up with Dr. Lovie Macadamia in 1 to 2 weeks.   TIME SPENT: 35 minutes.   ____________________________ Lafonda Mosses Posey Pronto, MD shp:drc D: 11/01/2011 14:30:55 ET T: 11/02/2011 15:47:49 ET JOB#: [id number]  cc: Daffney Greenly H. Posey Pronto, MD, <Dictator>, Youlanda Roys. Lovie Macadamia, MD Alric Seton MD ELECTRONICALLY SIGNED 11/11/2011 14:49

## 2014-09-11 NOTE — H&P (Signed)
PATIENT NAME:  [name] [name], [name] [name] MR#:  [id number] DATE OF BIRTH:  1941/04/17  DATE OF ADMISSION:  10/30/2011  PRIMARY CARE PHYSICIAN: Dr. [facility]  ER PHYSICIAN: Dr. Renard Hamper  ADMITTING PHYSICIAN: Dr. Pearletha Furl  PRESENTING COMPLAINT: Fever.  HISTORY OF PRESENT ILLNESS: Patient is a 74 year old male who was in his usual state of health until two days ago started having recurrent fever. Fever was high grade with temperature 102 recorded at home associated with chills, rigors. No loss of consciousness. No paroxysmal nocturnal dyspnea, orthopnea, pedal edema. No dysuria. Admits occasional cough but no shortness of breath. No chest pain. For this he was brought to the Emergency Room where workup included CBC which shows elevated white count of 15, chemistry which was nondiagnostic. Urinalysis shows urinary tract infection and chest x-ray which showed questionable pneumonia versus atelectasis. For this he was referred to the hospitalist for further evaluation. Patient denied any recent sick contacts, long distance travel, or recent change in medication.   REVIEW OF SYSTEMS: Full 10 point system review and negative except for the following. CONSTITUTIONAL: Fever, fatigue. There is no weight loss or weight gain. EYES: No blurred vision, eye discharge or redness. ENT: No tinnitus, epistaxis, difficulty swallowing. RESPIRATORY: Occasional cough which is nonproductive. No wheezing or painful respiration. No hemoptysis. CARDIOVASCULAR: No chest pain, orthopnea, or pedal edema, palpitations, or syncope. GASTROINTESTINAL: No nausea, vomiting, diarrhea. No abdominal pain, change in bowel habits. GENITOURINARY: No dysuria. Admits to frequency but no hematuria. ENDOCRINE: No polyuria, polydipsia, excessive heat or cold intolerance. HEMATOLOGIC: No easy bruising, swollen glands or bleeding. SKIN: No new rashes or change in hair or skin texture. MUSCULOSKELETAL: No joint pain, redness, limited activity. NEURO:  No numbness, vertigo, seizure, memory loss except for old right hemiplegia. PSYCH: No anxiety, depression, or nervousness.   PAST MEDICAL HISTORY:  1. Hyperlipidemia.  2. Hypertension.  3. 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 Patient is bedbound since year of 2000 following his cerebrovascular accident.  4. Also had history of transient ischemic attacks, two episodes last year.   SOCIAL HISTORY: Lives with his wife. No alcohol, tobacco, or recreational drug use. Has remote history of smoking, quit in 2000 after his CVA.   FAMILY HISTORY: Reviewed and noncontributory.   ALLERGIES: No known drug allergies.   MEDICATIONS:  1. Simvastatin 40 mg once a day.  2. Ramipril 10 mg once a day. 3. Oxybutynin 5 mg twice daily.  4. Diazepam 5 mg once a day. 5. Atenolol 50 mg daily.   PHYSICAL EXAMINATION:  VITAL SIGNS: Temperature 99.5, pulse 85, respiratory rate 20, blood pressure 129/78, sats 96% on room air.   GENERAL: Elderly African American male lying on the gurney, awake, alert, oriented to time, place, and person, in no obvious distress, wife at bedside, supportive.   HEENT: Atraumatic, normocephalic. Pupils equal, reactive to light and accommodation. Extraocular movement intact. Mucous membranes pink, moist.   NECK: Supple. No JV distention, mostly twisted to the left from his right-sided weakness.   CHEST: Shallow respiratory effort. Few transmitted breath sounds in right base. No rhonchi, no rales.   HEART: Regular rate and rhythm. No murmurs.   ABDOMEN: Full, moves with respiration. Feeding tube epigastric region, nontender. No erythema around it. Bowel sounds normoactive. No organomegaly.   EXTREMITIES: No edema. No clubbing. No deformity except for right-sided hemiplegia with muscle atrophy and contracture in the right-sided limbs.   NEUROLOGICAL: Cranial nerves II through XII grossly intact. Sensory system intact. Motor  system has right  hemiplegia.   LABORATORY, DIAGNOSTIC, AND RADIOLOGICAL DATA: EKG shows sinus rhythm 84 with PVCs and T wave inversion in lateral leads. Chest x-ray showed mild opacity in the mid and lower left lung, questionable for atelectasis versus pneumonia. Also shows small nodular density in the right lateral mid lung zones. Recommending PA and lateral x-rays. CBC: White count 15, hemoglobin 14, platelets 102, neutrophil count 86. Chemistry: Potassium 3.3, creatinine 1, BUN 15, glucose 130, calcium 8.7. Normal LFTs. Urinalysis showed negative nitrite, leukocyte esterase 3 +, white cells 107, positive bacteria.   IMPRESSION:  1. Febrile illness most likely from urinary tract infection to consider also possible early pneumonia.  2. History of CVA with right hemiplegia.  3. Hyperlipidemia, stable.  4. Urinary tract infection. 5. Pneumonia.   PLAN:  1. Admit to general medical floor for serial cardiac enzymes. Check blood culture x2. Follow up urine culture. IV antibiotics with ceftriaxone and Zithromax, azithromycin.  2. GI prophylaxis with Protonix. Deep vein thrombosis prophylaxis with Lovenox.  3. Continue supportive care.  4. Wound care protocol to prevent decubitus ulcer.  5. Anticipate discharge in next 48 hours.  6. Resume rest of outpatient medications.  7. CODE STATUS: FULL CODE.   TOTAL PATIENT CARE TIME: 50 minutes. ____________________________ Jules Husbands Pearletha Furl, MD mia:cms D: 10/30/2011 00:28:25 ET T: 10/30/2011 08:49:57 ET  JOB#: [id number] cc: Whitnie Deleon I. Pearletha Furl, MD, <Dictator> Youlanda Roys. Lovie Macadamia, MD Carola Frost MD ELECTRONICALLY SIGNED 10/30/2011 23:26
